# Patient Record
Sex: Male | Born: 1940 | Race: Black or African American | Hispanic: No | Marital: Single | Smoking: Never smoker
Health system: Southern US, Community
[De-identification: ages and names within clinical notes are randomized; demographics above are authoritative.]

## PROBLEM LIST (undated history)

## (undated) DIAGNOSIS — E119 Type 2 diabetes mellitus without complications: Secondary | ICD-10-CM

## (undated) DIAGNOSIS — H269 Unspecified cataract: Secondary | ICD-10-CM

## (undated) DIAGNOSIS — I10 Essential (primary) hypertension: Secondary | ICD-10-CM

## (undated) DIAGNOSIS — N289 Disorder of kidney and ureter, unspecified: Secondary | ICD-10-CM

## (undated) DIAGNOSIS — I639 Cerebral infarction, unspecified: Secondary | ICD-10-CM

## (undated) HISTORY — DX: Type 2 diabetes mellitus without complications: E11.9

## (undated) HISTORY — DX: Cerebral infarction, unspecified: I63.9

## (undated) HISTORY — DX: Unspecified cataract: H26.9

## (undated) HISTORY — DX: Essential (primary) hypertension: I10

## (undated) HISTORY — DX: Disorder of kidney and ureter, unspecified: N28.9

---

## 1998-01-06 ENCOUNTER — Emergency Department: Admit: 1998-01-06 | Payer: Self-pay | Admitting: Emergency Medicine

## 2000-06-09 ENCOUNTER — Emergency Department: Admit: 2000-06-09 | Payer: Self-pay | Source: Emergency Department | Admitting: Internal Medicine

## 2001-08-04 ENCOUNTER — Inpatient Hospital Stay
Admission: EM | Admit: 2001-08-04 | Disposition: A | Discharge status: Other Acute Inpatient Hospital | Payer: Self-pay | Source: Emergency Department | Admitting: Internal Medicine

## 2001-08-08 ENCOUNTER — Observation Stay
Admission: AD | Admit: 2001-08-08 | Disposition: A | Discharge status: Home / Self Care | Payer: Self-pay | Source: Ambulatory Visit | Admitting: Cardiovascular Disease

## 2001-11-26 ENCOUNTER — Inpatient Hospital Stay
Admission: EM | Admit: 2001-11-26 | Disposition: A | Discharge status: Home / Self Care | Payer: Self-pay | Source: Emergency Department | Admitting: Vascular Neurology

## 2002-09-20 ENCOUNTER — Inpatient Hospital Stay
Admission: AD | Admit: 2002-09-20 | Disposition: A | Discharge status: Home / Self Care | Payer: Self-pay | Source: Ambulatory Visit | Admitting: Hospice and Palliative Medicine

## 2012-01-03 NOTE — Discharge Summary (Unsigned)
[name], [name]      MEDICAL RECORD NUMBER:         [id number]            ATTENDING PHYSICIAN:           Marshall Cork, MD      DATE OF ADMISSION:             11/26/2001      DATE OF DISCHARGE:             11/30/2001            HOSPITAL COURSE: The patient was admitted with complaints of episodic      vertigo and dizziness. He was admitted for further evaluation. He had an      MRI and MRA of his brain that did not show any acute abnormalities, but did      show a small right pontine lacune. 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 The MRA intracranially      did not show any clear abnormalities.  He also had a transthoracic      echocardiogram that was essentially unremarkable. There was some mild LVH      with an EF of 65%.  His B12 level was 328. TSH was 0.534 both within the      normal range. His cholesterol was 106. Urinalysis was negative. RPR was      nonreactive. He had a chest x-ray that initially showed some mild perihilar      atelectasis with a possible small left pleural effusion. The patient was      asymptomatic.  A followup PA and lateral chest x-ray showed the left      basilar pleural and parenchymal changes that are probably fibrotic.      Otherwise, negative.            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 The patient has complete resolution of his      symptoms and remained asymptomatic while in the hospital. He was discharged      in stable condition. He was told to followup with his primary care      physician for the chest x-ray findings.  He will followup with me in      several weeks.                  ___________________________________        Date Signed: _______________      Marshall Cork, MD            EBS:amg:dlss      D:    [id number]      T:     12/01/2001      #:    [id number]      [id number]            cc:   Marshall Cork, MD

## 2012-01-03 NOTE — Discharge Summary (Unsigned)
[name], [name]      MEDICAL RECORD NUMBER:         [id number]            ATTENDING PHYSICIAN:           Wendie Chess, MD      DATE OF ADMISSION:             08/08/2001      DATE OF DISCHARGE:             08/09/2001                  DISCHARGE DIAGNOSES:      1.    Single vessel CAD, status post cardiac catheterization.      2.    Hypertension.      3.    Dyslipidemia.      4.    Suspect for diabetes mellitus.      5.    Complete heart block with beta blockers.            HISTORY OF PRESENT ILLNESS:   This 71 year old gentleman was transferred to      [facility] after admission to [facility] on 7/27 for a      chief complaint of dizziness, nausea, and vomiting.  The patient did [name]      have any chest pain, but did report some shortness of breath.  The patient      had been having dizziness for about 3 months and was being treated by his      primary care physician.  During initial work-up, the cardiovascular group      was consulted and the patient had a positive thallium stress test,      revealing ischemia of the anterior lateral wall.            HOSPITAL COURSE:  The patient was transferred to [facility] for      cardiac catheterization on 7/29, after the patient was ruled out for      myocardial infarction and a cardiac catheterization was performed by Dr.      Desmond Dike.  The findings of the cardiac catheterization are as      follows - 70-80% distal LAD occlusion.  Left circumflex normal.  RCA      normal.  No left or right shunt.  Moderate AI.  Ejection fraction of      approximately 52%.  In the post cath period, the patient did have a      significant elevation in his blood pressure, requiring some change in his      medications and some additional clonidine to maintain his blood pressure      below [id number] systolic.            Otherwise, the patient's stay was uncomplicated after cardiac      catheterization, but should make of note that  during his stay in [facility], the patient had a reaction of complete heart block to the      atenolol that he came into the hospital and was maintained on during his      brief stay at [place]:   Neurologically, the patient is awake, alert,  oriented, comfortable,  and without further chest pain or other complaints.      Cardiovascular - regular rate and rhythm, normal S1 and S2, with a 1/6      stable murmur at the left sternal border.  Respirations non-labored.  Lungs      clear to auscultation bilaterally.  Abdomen soft, non-tender, and      non-distended, with positive bowel sounds.  Extremities without edema.      Bilateral inguinal cath sites are without bleeding or hematoma.  Distal      peripheral pulses intact.  Vital signs -heart rate 59-70, respirations 18,      and blood pressure 140/78.  The patient's O2  sat is 100% on room air.            DISCHARGE MEDICATIONS:  Aspirin 325 mg daily, Norvasc 10 mg p.o. daily,      Altace 10 mg p.o. daily, hydrochlorothiazide 25 mg p.o. daily, and p.r.n.      nitroglycerine.            DISCHARGE INSTRUCTIONS:  The patient is to follow up with his PCP, Dr.      Lissa Merlin, in approximately one week for further control of his blood pressure      and investigation of possible diabetes mellitus, follow-up labs to monitor      serum creatinine, and dyslipidemia.  The patient is also going to follow up      with Dr. Era Bumpers in the [facility] office in approximately 1-2 weeks.                  ___________________________________        Date Signed: _______________      Wendie Chess, MD            Dictated by: Marton Redwood, NP            SP:amg:mp      D:    08/09/2001      T:    08/11/2001      #:    [id number]      [id number]            cc:   Marton Redwood, NP

## 2012-01-03 NOTE — Procedures (Unsigned)
[name], [name]      MEDICAL RECORD NUMBER:         [id number]            DATE OF PROCEDURE:             08/08/2001      SURGEON:                       Wendie Chess, MD            REFERRING PHYSICIAN:  Tye Maryland, M.D.            ANESTHESIA:            PREPROCEDURE DIAGNOSIS:            POSTPROCEDURE DIAGNOSIS:            PROCEDURE:      1. LEFT HEART CATHETERIZATION      2. RIGHT HEART CATHETERIZATION      3. SELECTIVE RIGHT AND LEFT CORONARY CINEANGIOGRAPHY      4. LEFT VENTRICULOGRAPHY      5. AORTOGRAPHY            CATHETERIZATION NUMBER:            HISTORY OF PRESENT ILLNESS:  The patient is a 71 year old male with      hypertension.  The patient presented to [facility] with dizziness      and nausea.  He was found to have intermittent complete heart block felt      secondary to beta blocker therapy for his hypertension.  Heart block      resolved with discontinuation of beta blockers.  Carotid Doppler studies      reportedly were unremarkable.  Thallium study was performed which      demonstrated anteroapical ischemia with inferior scar and cardiac      catheterization was recommended.  Echocardiogram demonstrated possible      aorta-right atrial communication and right heart catheterization was      requested.            DESCRIPTION OF PROCEDURE:  The patient was taken to the cardiac      catheterization laboratory after informed consent was obtained.  He was      premedicated with Fentanyl, Versed, and Benadryl for conscious sedation.      The right femoral area was prepared and draped in the usual sterile manner.      Xylocaine was infiltrated.  The right femoral artery was punctured and a      6-French sheath was placed percutaneously.  Using 6-French JL-4 and 3-D RC      catheters, selective left and right coronary artery cineangiography was      performed by hand injection of angiographic dye in varying projections.      Left heart catheterization and left  ventriculography were performed with a      6-French pigtail catheter with pullback pressure measurements across the      aortic valve.  The catheter and sheath were withdrawn and the puncture site      was closed with Vasoseal.  The left femoral artery was then punctured.  A      6-French sheath was placed percutaneously.  The left femoral vein was      punctured and a 7-French sheath was  placed percutaneously.  Right heart      catheterization was performed with a Swan-Ganz catheter with oxygen samples      for oximetry.  An aortogram was performed with a 6-French pigtail catheter      in the left anterior oblique projection.  Catheters and sheaths were      removed.  The puncture sites were closed with Vasoseal.  There were no      complications or allergic reactions.                                         CATHETERIZATION FINDINGS            I.    HEMODYNAMICS:                  Sinus rhythm - rate 60.            Right atrium - A 7, V 5, mean 4.            Right ventricle - 32/7.            Pulmonary artery - 32/17, mean 20.            Pulmonary capillary wedge - A 15, V 14, mean 12.            Aorta - 80/90, mean 123.            Left ventricle - 192/Z 25.            There was no gradient on pullback across the aortic valve.            Pullback pressures - left ventricular - 182/Z 27, aorta 181/88, mean            125.                        Oxygen saturations -                Right atrium - 52.7%.                Right ventricle - 62.6%.                Pulmonary artery - 62.5%.                Central aorta - 92.6%.                Superior vena cava - 67%.                Inferior vena cava - 71.5%.                  Cardiac output by estimated FIC was 4.3 liters per minute.            II.   CORONARY CINEANGIOGRAMS:            a.    Left Main Coronary Artery - The left main coronary artery is normal      without stenosis.            b.    Left Anterior Descending Coronary Artery (LAD) - The left anterior       descending coronary artery (LAD) has approximately 30% stenosis proximally      with mild luminal irregularity throughout.  The distal left anterior      descending  coronary artery (LAD) at the apex has an 80 to 90% stenosis.      This is just proximal to the apical recurrent branches.  The lateral apical      recurrent branch has an 80 to 90% stenosis.            c.    Ramus Intermedius - There is a large bifurcating ramus intermedius      branch which has mild luminal irregularity without stenosis.            d.    Circumflex Coronary Artery - The circumflex coronary artery gives off      a posterolateral branch which is normal without stenosis.            e.    Right Coronary Artery (RCA) - The right coronary artery (RCA) is      dominant giving off the posterior descending coronary artery and      posterolateral branches.  Mild luminal irregularity is present without      significant stenosis.            III.  LEFT VENTRICULOGRAM:                  Right anterior oblique left ventriculography demonstrates normal left            ventricular contractility without regional wall motion abnormalities.            No significant mitral valve insufficiency is identified.  Left            ventricular ejection fraction was calculated at 52%.            IV.   AORTOGRAM:            Aortography in the left anterior oblique projection demonstrates moderate      aortic valve insufficiency.  The aortic valve is trileaflet.            SUMMARY:            1. Moderate aortic valve insufficiency.      2. Normal left ventricular systolic function.      3. Coronary artery disease with severe stenosis of the very distal left         anterior descending coronary artery (LAD) with mild luminal irregularity         of the proximal left anterior descending coronary artery (LAD).      4. No evidence of intracardiac shunt.            DISCUSSION AND RECOMMENDATIONS:  The patient's anti-hypertensive      medications will be adjusted.  Beta  blockers will be avoided because of      transient complete heart block.  Once his blood pressure is better      regulated he should be stable for discharge.                              ___________________________________                Date Signed:      _______________      Wendie Chess, MD            LAM:amw:MH      D:    08/08/2001      T:    08/09/2001      #:    [id number]      N:  [id number]            cc:   Wendie Chess, MD            Rivka Spring, MD            Tye Maryland, M.D.      [address], [id number]      [place], [place]  [id number]            Jodell Cipro, M.D.      [address]      [place],   [address]*

## 2012-01-03 NOTE — Procedures (Unsigned)
PATIENTIAN, FRANZONI      MEDICAL RECORD NUMBER:         [id number]            DATE OF PROCEDURE:             11/29/2001      PHYSICIAN:                     Fredda Hammed, MD      REFERRING PHYSICIAN:     Marshall Cork, MD            CLINICAL INDICATIONS/DIAGNOSES:  Cerebrovascular accident and syncope.                                            M-MODE/2-D            35   mm      LA                                        (19-33mm)      30   mm      Ao Root                                         (19-40mm)      22   mm      Ao Leaflet Separation                     (15-60mm)      18   mm      RVd                                       (7-56mm)      29   mm      LVs                                       (20-26mm)      45   mm      LVd                                       (37-57mm)      14   mm      IVSd                                      (6-16mm)      13   mm      LVPWd                                     (6-31mm)  36%          Fractional Shortening                     (25-45%)      66%          Est. EF                                   (>55%)            (No pericardial effusion and no pleural effusion.)            PEAK VELOCITY          PEAK              DOPPLER      DEGREE OF VALVULAR      VALVE                          GRADIENT         MEAN             REGURGITATION      AREA                                           GRADIENT      Mitral 0.8  m/s(0.6-1.3)      Aortic 1.0  m/s(1.0-1.7)      Pulm.  0.9  m/s(0.6-0.9)      Tric.   0.4  m/s(0.3-0.7)      LVOT   0.9  m/s(0.7-1.7)            DESCRIPTION OF FINDINGS/IMPRESSION OF M-MODE AND TWO-DIMENSIONAL STUDY:      1. Normal intracardiac chamber dimensions.      2. Mild to moderate concentric left ventricular hypertrophy with normal         left ventricular systolic function.  Estimated left ventricular ejection         fraction is 65%.      3. No structural valvular abnormalities.      4. No pericardial effusion.      5. No thrombi,  vegetations or other abnormal intracardiac masses.            ___________________________________      Fredda Hammed, MD            SPR:amw:MH      D:     11/29/2001      T:     11/30/2001      #:     [id number]      [id number]            cc:    Fredda Hammed, MD             Marshall Cork, MD

## 2012-01-03 NOTE — Procedures (Unsigned)
PATIENT NAME:  [name], [name]         MED REC #:  [id number]            ORD. DR.:                                DATE OF EXAM:  08/05/2001            ALT. DR.:                                NS/BED:  2C  40 01            D.O.B.:  03-28-1940     AGE:  71        ACCT.#:  [id number]                                                             ECHOCARDIOGRAM REPORT                  TAPE:  [id number]        # [id number]                                                    ECHOCARDIOGRAM                                              M-MODE COMMENTS                 LA DIMENSION             3.6  cm.  (1.5-4.0)           AORTIC ROOT              3.5  cm.  (2.0-3.7)           LVD DIMENSION            5.4  cm.  (3.7-5.6)           LVS DIMENSION            3.6  cm.  (2.0-4.0)           LV PW THICKNESS          1.1  cm.  (0.6-1.1)           IVS THICKNESS            0.9  cm.  (0.6-1.1)           FRACTIONAL SHORTENING    32   %    (24-64%)           LVEF                     60   %           MITRAL VALVE EXCURSION        mm  SLOPE AML                     mm/s (>30mm/sec)           RVd DIMENSION            1.4  cm.  (0.7-2.3)                                                    2-D            MITRAL VALVE:      AORTIC VALVE:      LEFT VENTRICULAR WALL MOTION:      LEFT VENTRICULAR CAVITY:      RIGHT HEART:                                                        DOPPLER                       VELOCITIES                        REGURGITATION            AORTIC VALVE         -     m/s (0.9-1.8)           MILD      MITRAL VALVE         -     m/s (0.6-1.4)           MILD      TRICUSPID VALVE      -     m/s (0.4-0.8)           MILD      PULMONIC VALVE       -     m/s (0.5-0.9)           MILD                  INDICATION:  COMPLETE HEART BLOCK; HYPERTENSION; SYNCOPE.            EXAMINATION:  ECHOCARDIOGRAM/2D/M MODE COMP.            DESCRIPTION OF FINDINGS:  M-MODE AND 2-D FINDINGS:  There is normal left      ventricular cavity size, wall  thickness and systolic function with no      segmental regional wall motion abnormalities.  The mitral valve leaflets      are minimally thickened with intact leaflet excursion.  There appears to be      a trileaflet aortic valve which is slightly thickened and/or calcified with      intact leaflet excursion.  The pulmonic and tricuspid valve leaflets appear      structurally intact with intact leaflet excursion.  The left atrial size is      normal.  The left ventricular cavity size is normal with intact systolic      function.  There is no pericardial effusion.            IMPRESSION      1.   NORMAL LEFT VENTRICULAR CAVITY SIZE, WALL  THICKNESS AND SYSTOLIC      FUNCTION WITH NO SEGMENTAL REGIONAL WALL MOTION ABNORMALITIES.      2.   MINIMALLY THICKENED MITRAL VALVE LEAFLETS WITH VERY MILD DEGREE OF      MITRAL REGURGITATION.      3.   MILD AORTIC VALVE SCLEROSIS WITH MILD AORTIC INSUFFICIENCY.      4.   MILD TRICUSPID REGURGITATION WITH ESTIMATED RIGHT VENTRICULAR SYSTOLIC      PRESSURE OF APPROXIMATELY 37 MMHG.      5.   DOPPLER EVALUATION NOTES A SMALL JET DURING DIASTOLE NEAR THE AORTIC      VALVE, WITH POSSIBLE COMMUNICATION WITH THE RIGHT ATRIUM AND A SMALL SINUS      OF VALSALVA ANEURYSM WITH AORTIC TO RIGHT ATRIAL COMMUNICATION CANNOT BE      FULLY EXCLUDED.                        Dictating Physician:  [place]  [id number]                  ______________________________________________  __________      _______________      Signature of Reviewing Physician                      Physician # Date of      Signature            [id number]      D: 08/07/2001  5:07 P      T: 08/17/2001  3:18 P      J: [id number]      N: [id number]      cc:   Rivka Spring, MD                        Fax/Mailed:

## 2012-01-03 NOTE — Discharge Summary (Unsigned)
The patient was transferred to [facility] for cardiac cath.            ADMISSION DIAGNOSIS:                 Nausea and vomiting.            DISCHARGE DIAGNOSES:      1.   Intermediate coronary syndrome.      2.   Complete heart block.      3.   Hypertension.      4.   Carotid insufficiency.            HOSPITAL COURSE:                     This 71 year old man was admitted to      the hospital after he was working outside and began to experience nausea      and vomiting.  The patient was admitted secondary to significant EKG change      compared to his previous EKG.  EKG performed at the time of admission      showed that the patient had inferior and anterior lateral ischemic change.      The patient was admitted to the telemetry unit at which time during the      admission the patient had a 5.1 second complete third degree heart block.      Cardiology consult was obtained.  An echo as well as adenosine thallium was      performed.  The thallium showed a fixed defect in the inferior wall and      reversible defect anterior septal.  The patient then was transferred to      [facility] for cardiac cath and possible intervention including      angioplasty.                        TDL:2spps      Dictated:    08/08/2001  8:15 P        _____________________________      ___________      Transcribed: 08/10/2001  6:54 A        Juluis Rainier, MD                 Date      Signed      Document Number: [id number]                  CC:  Juluis Rainier, MD

## 2012-01-04 NOTE — Discharge Summary (Unsigned)
ATTENDING MD:  Laverda Sorenson, MD      ADMITTED:      09/20/2002      DISCHARGED:    10/15/2002            DISCHARGE DIAGNOSES      1.   Left middle cerebral artery infarction.      2.   Hypertension.      3.   Hyperlipidemia.      4.   History of lacunar cerebrovascular accident.            CONSULTATIONS:  Dr. Simmie Davies of internal medicine.            MAJOR COMPLICATIONS:  None.            PROCEDURES:  None.            HISTORY OF PRESENT ILLNESS:  The patient is a 71 year old gentleman who was      admitted to [facility] with left middle cerebral artery      infarction on September 14, 2002.  He has a history of myocardial infarction      in 1993.  He also has hypertension and hyperlipidemia.  He was started on      Plavix and aspirin.            Physical exam on admission revealed a well-developed male in no acute      distress.  Pupils were equal and reactive to light.  Ears, nose, throat,      and mouth were clear. Neck was supple.  Skin was intact.  Lungs were clear.      Heart had regular rhythm.  Abdomen was soft and nontender.  No masses or      hepatomegaly was present.  Bowel sounds were normal.  Extremities are      without edema.  Neurologically, the patient was awake with global aphasia.      Right hemiparesis with trace motor activity in the right lower extremity      was present.            HOSPITAL COURSE:  The patient remained medically stable and actively      participated in the rehabilitation program.  He made significant gains in      all areas of therapy, the details of which are recorded in the chart.  He      was discharged to home on aspirin 325 mg p.o. daily; atorvastatin 20 mg      p.o.      nightly; clopidogrel 75 mg p.o. daily; metformin; hydrochloride 500 mg p.o.      q.p.m.; atenolol 25 mg p.o. daily; multivitamin with mineral 1 p.o. daily;      Ramipril 5 mg p.o. daily.  Followup with primary care physician within a      week after discharge.  Home physical,  occupational, and speech therapy, as      well as home health aide will be provided.                              ___________________________________     Date Signed: _______________      Laverda Sorenson, MD                  D 10/15/2002  1:02 P; T 10/17/2002  9:16 A; [id number] - - , [id number], [id number]      CC:  Laverda Sorenson, MD

## 2012-05-11 ENCOUNTER — Ambulatory Visit: Payer: Medicare Other | Attending: Family Medicine | Admitting: Physical Therapy

## 2012-05-11 DIAGNOSIS — M629 Disorder of muscle, unspecified: Secondary | ICD-10-CM | POA: Insufficient documentation

## 2012-05-11 DIAGNOSIS — R4701 Aphasia: Secondary | ICD-10-CM | POA: Insufficient documentation

## 2012-05-11 DIAGNOSIS — R279 Unspecified lack of coordination: Secondary | ICD-10-CM | POA: Insufficient documentation

## 2012-05-11 DIAGNOSIS — M2420 Disorder of ligament, unspecified site: Secondary | ICD-10-CM | POA: Insufficient documentation

## 2012-05-11 DIAGNOSIS — M6281 Muscle weakness (generalized): Secondary | ICD-10-CM | POA: Insufficient documentation

## 2012-05-11 DIAGNOSIS — Z5189 Encounter for other specified aftercare: Secondary | ICD-10-CM | POA: Insufficient documentation

## 2012-05-12 ENCOUNTER — Ambulatory Visit: Payer: Medicare Other

## 2012-05-17 ENCOUNTER — Ambulatory Visit: Payer: Medicare Other | Admitting: Speech Pathology

## 2012-05-17 ENCOUNTER — Ambulatory Visit: Payer: Medicare Other | Admitting: Physical Therapy

## 2012-05-19 ENCOUNTER — Ambulatory Visit: Payer: Medicare Other | Admitting: Physical Therapy

## 2012-05-19 ENCOUNTER — Ambulatory Visit: Payer: Medicare Other | Admitting: Speech Pathology

## 2012-05-23 ENCOUNTER — Ambulatory Visit: Payer: Medicare Other | Admitting: Speech Pathology

## 2012-05-23 ENCOUNTER — Ambulatory Visit: Payer: Medicare Other | Admitting: Physical Therapy

## 2012-05-30 ENCOUNTER — Ambulatory Visit: Payer: Medicare Other

## 2012-06-01 ENCOUNTER — Encounter: Payer: Medicare Other | Admitting: Occupational Therapy

## 2012-06-02 ENCOUNTER — Ambulatory Visit: Payer: Medicare Other | Admitting: Physical Therapy

## 2012-06-02 ENCOUNTER — Encounter: Payer: Medicare Other | Admitting: *Deleted

## 2012-06-06 ENCOUNTER — Ambulatory Visit: Payer: Medicare Other | Admitting: Occupational Therapy

## 2012-06-06 ENCOUNTER — Ambulatory Visit: Payer: Medicare Other | Admitting: Physical Therapy

## 2012-06-08 ENCOUNTER — Ambulatory Visit: Payer: Medicare Other | Admitting: Physical Therapy

## 2012-06-08 ENCOUNTER — Ambulatory Visit: Payer: Medicare Other | Admitting: *Deleted

## 2012-06-09 ENCOUNTER — Ambulatory Visit: Payer: Medicare Other | Admitting: *Deleted

## 2012-06-12 ENCOUNTER — Ambulatory Visit: Payer: Medicare Other | Admitting: Physical Therapy

## 2012-06-13 ENCOUNTER — Ambulatory Visit: Payer: Medicare Other | Admitting: Physical Therapy

## 2012-06-13 ENCOUNTER — Ambulatory Visit: Payer: Medicare Other | Attending: Family Medicine | Admitting: Occupational Therapy

## 2012-06-13 DIAGNOSIS — R279 Unspecified lack of coordination: Secondary | ICD-10-CM | POA: Insufficient documentation

## 2012-06-13 DIAGNOSIS — Z5189 Encounter for other specified aftercare: Secondary | ICD-10-CM | POA: Insufficient documentation

## 2012-06-13 DIAGNOSIS — M629 Disorder of muscle, unspecified: Secondary | ICD-10-CM | POA: Insufficient documentation

## 2012-06-13 DIAGNOSIS — M6281 Muscle weakness (generalized): Secondary | ICD-10-CM | POA: Insufficient documentation

## 2012-06-13 DIAGNOSIS — R4701 Aphasia: Secondary | ICD-10-CM | POA: Insufficient documentation

## 2012-06-13 DIAGNOSIS — M2420 Disorder of ligament, unspecified site: Secondary | ICD-10-CM | POA: Insufficient documentation

## 2012-06-15 ENCOUNTER — Ambulatory Visit: Payer: Medicare Other | Admitting: Physical Therapy

## 2012-06-15 ENCOUNTER — Ambulatory Visit: Payer: Medicare Other | Admitting: Occupational Therapy

## 2012-06-20 ENCOUNTER — Ambulatory Visit: Payer: Medicare Other | Admitting: Occupational Therapy

## 2012-06-23 ENCOUNTER — Ambulatory Visit: Payer: Medicare Other | Admitting: Occupational Therapy

## 2012-06-27 ENCOUNTER — Ambulatory Visit: Payer: Medicare Other | Admitting: Occupational Therapy

## 2012-06-29 ENCOUNTER — Encounter: Payer: Medicare Other | Admitting: Occupational Therapy

## 2012-07-11 ENCOUNTER — Ambulatory Visit: Payer: Medicare Other | Admitting: Occupational Therapy

## 2012-07-18 ENCOUNTER — Ambulatory Visit: Payer: Medicare Other | Admitting: Occupational Therapy

## 2012-07-24 ENCOUNTER — Ambulatory Visit: Payer: Medicare Other | Attending: Family Medicine | Admitting: Occupational Therapy

## 2012-07-24 DIAGNOSIS — R4701 Aphasia: Secondary | ICD-10-CM | POA: Insufficient documentation

## 2012-07-24 DIAGNOSIS — M629 Disorder of muscle, unspecified: Secondary | ICD-10-CM | POA: Insufficient documentation

## 2012-07-24 DIAGNOSIS — M6281 Muscle weakness (generalized): Secondary | ICD-10-CM | POA: Insufficient documentation

## 2012-07-24 DIAGNOSIS — R279 Unspecified lack of coordination: Secondary | ICD-10-CM | POA: Insufficient documentation

## 2012-07-24 DIAGNOSIS — M2420 Disorder of ligament, unspecified site: Secondary | ICD-10-CM | POA: Insufficient documentation

## 2012-07-24 DIAGNOSIS — Z5189 Encounter for other specified aftercare: Secondary | ICD-10-CM | POA: Insufficient documentation

## 2012-07-25 ENCOUNTER — Ambulatory Visit: Payer: Medicare Other | Admitting: Occupational Therapy

## 2013-06-08 ENCOUNTER — Other Ambulatory Visit (HOSPITAL_COMMUNITY): Payer: Self-pay | Admitting: Specialist

## 2013-06-08 DIAGNOSIS — N189 Chronic kidney disease, unspecified: Secondary | ICD-10-CM

## 2013-06-15 ENCOUNTER — Ambulatory Visit (HOSPITAL_COMMUNITY)
Admission: RE | Admit: 2013-06-15 | Discharge: 2013-06-15 | Disposition: A | Discharge status: Home / Self Care | Payer: Medicare Other | Source: Ambulatory Visit | Attending: Specialist | Admitting: Specialist

## 2013-06-15 DIAGNOSIS — N189 Chronic kidney disease, unspecified: Secondary | ICD-10-CM

## 2015-04-06 IMAGING — US US RENAL
1 series · 14 of 25 positions shown · non-contrast
Comparison: None.

CLINICAL DATA: Chronic kidney disease.

EXAM:
RENAL/URINARY TRACT ULTRASOUND COMPLETE

[Series 1: us renal · 0.22mm/px · 14 of 34 slices shown]
[im 1/34]
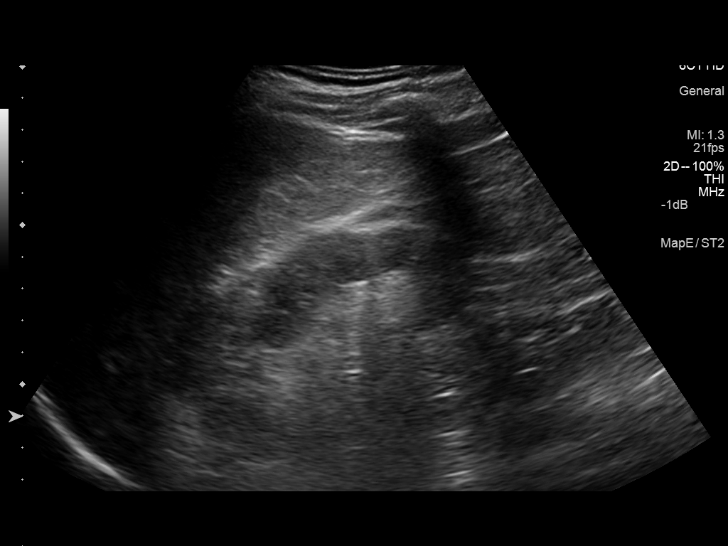
[im 3/34]
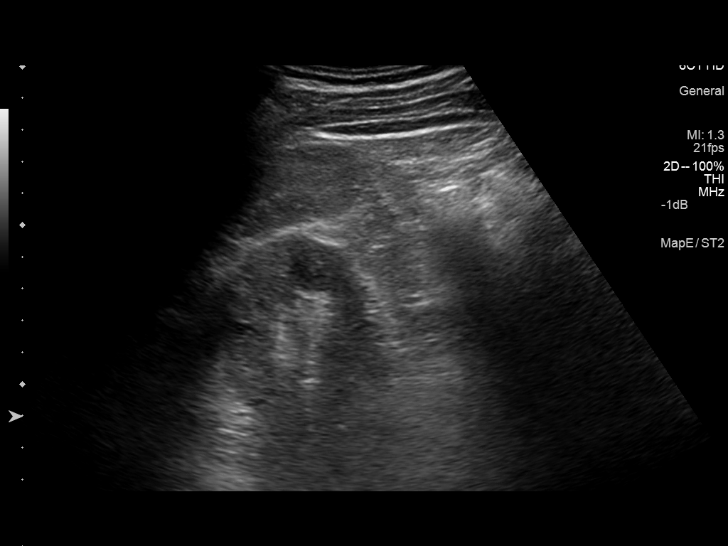
[im 6/34]
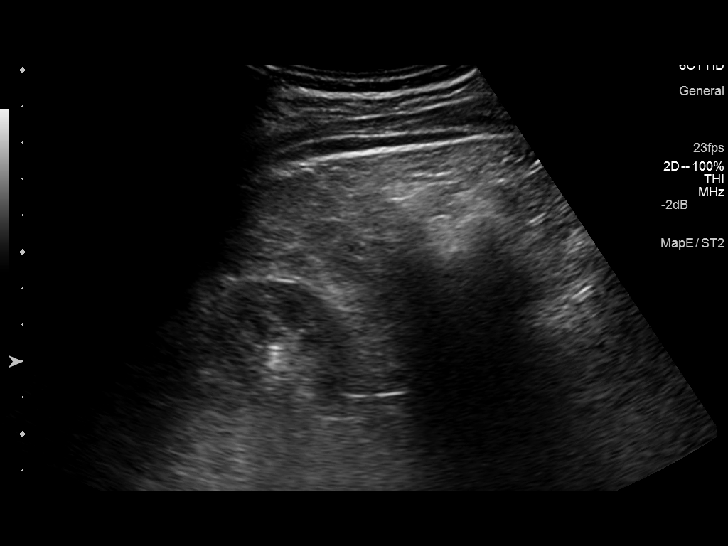
[im 9/34]
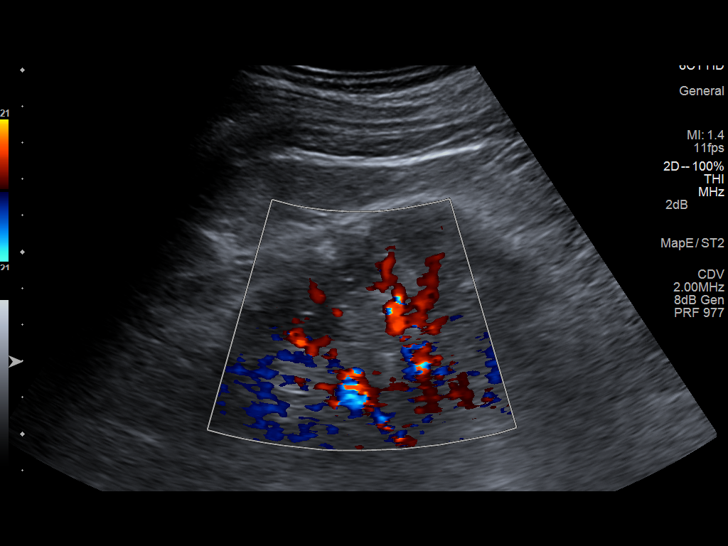
[im 12/34]
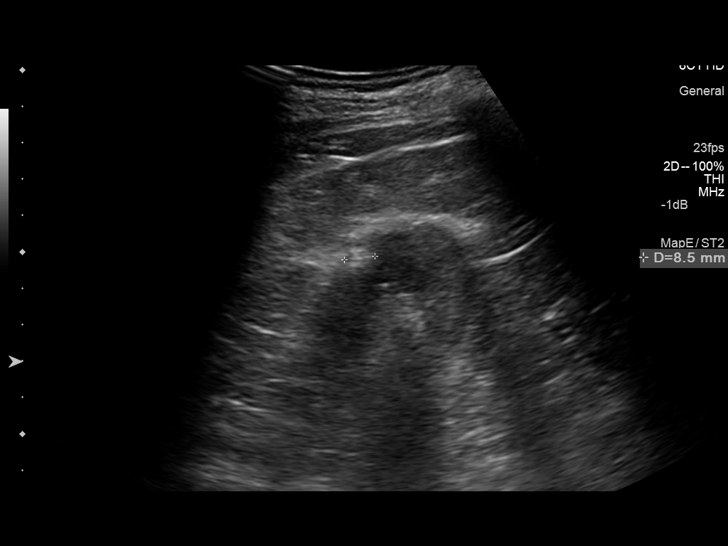
[im 13/34]
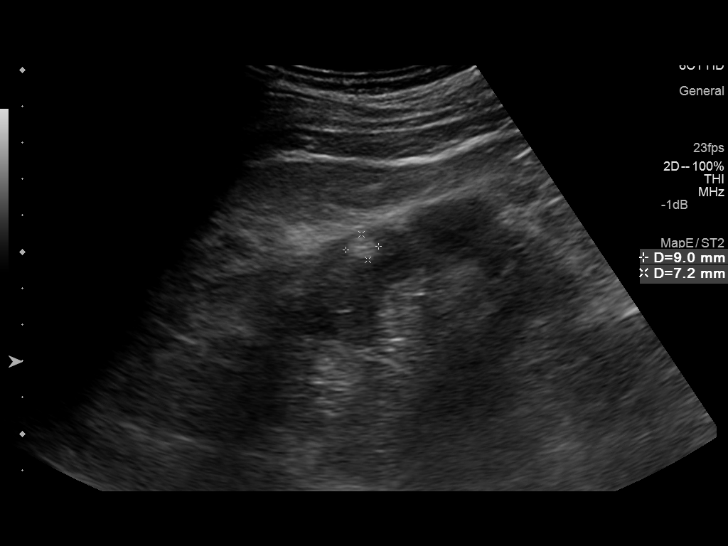
[im 16/34]
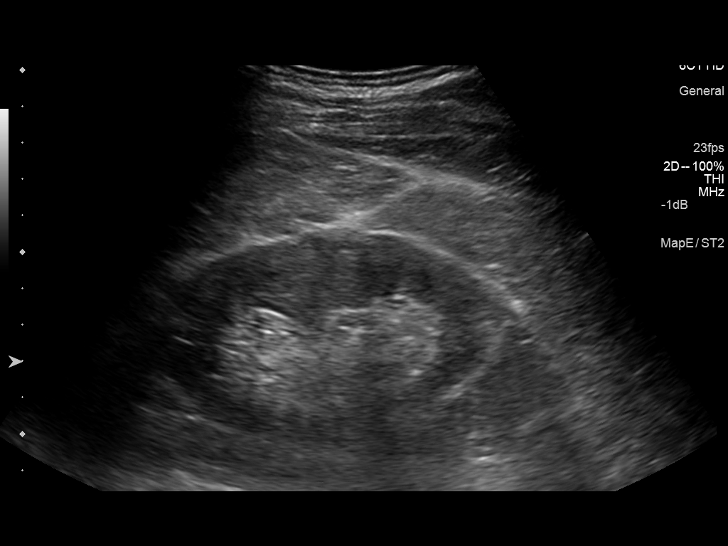
[im 18/34]
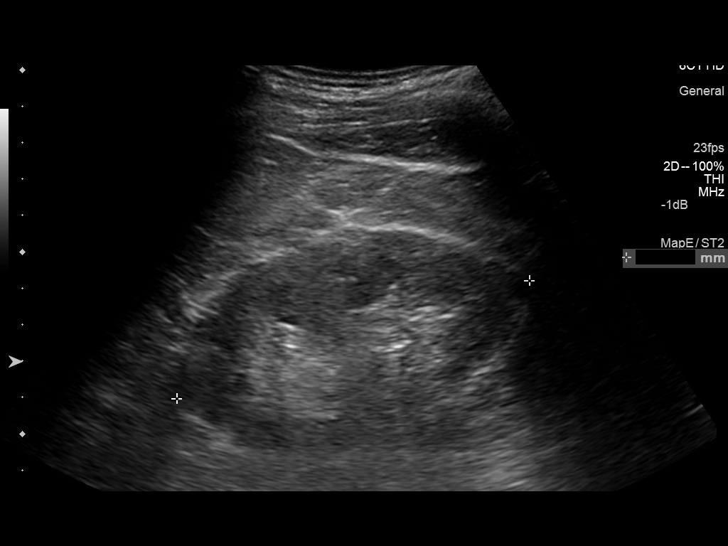
[im 21/34]
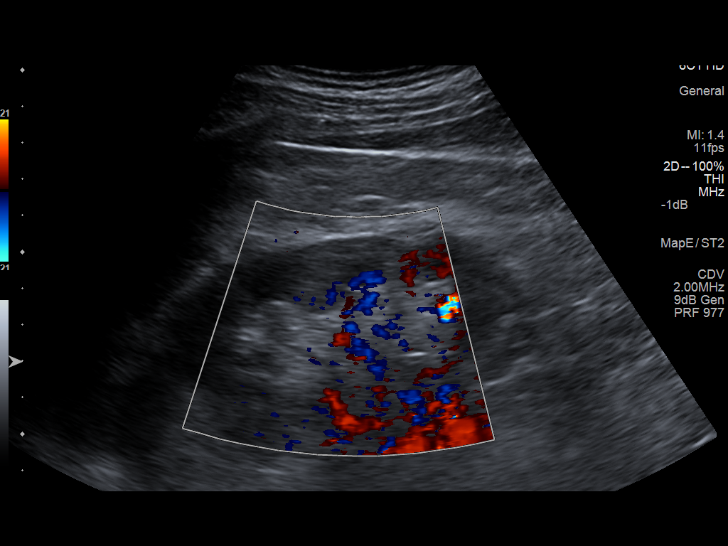
[im 23/34]
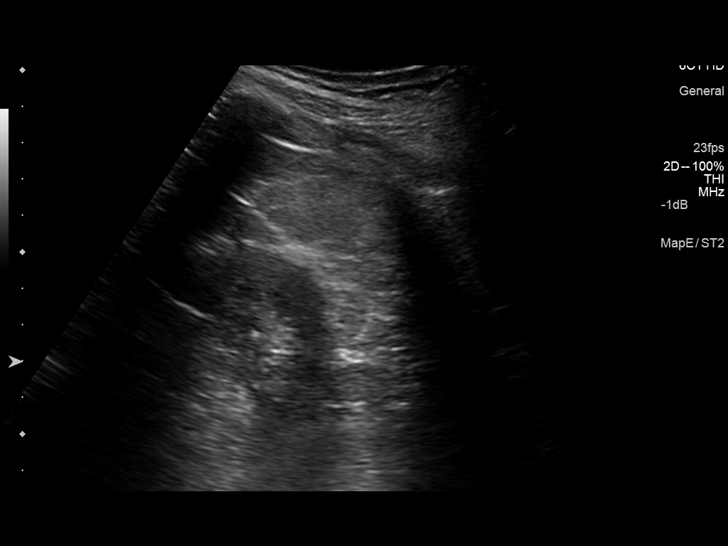
[im 25/34]
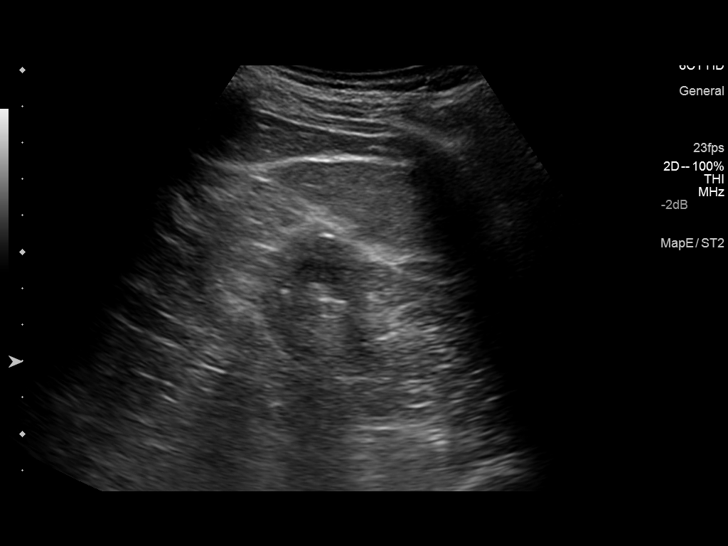
[im 28/34]
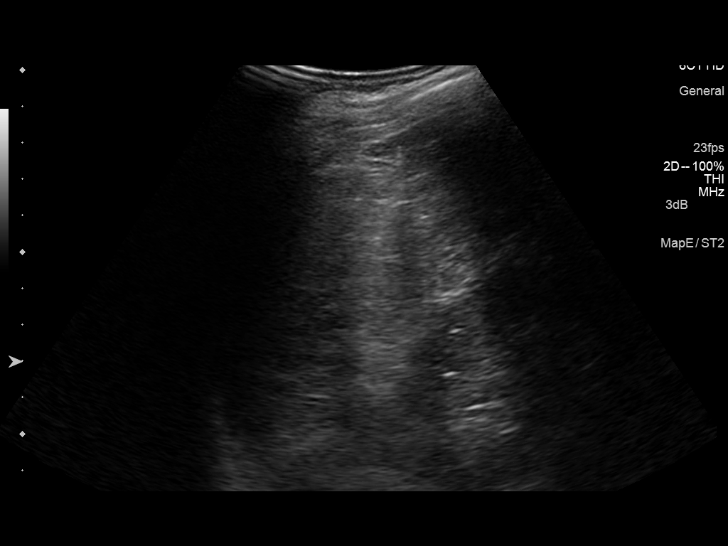
[im 31/34]
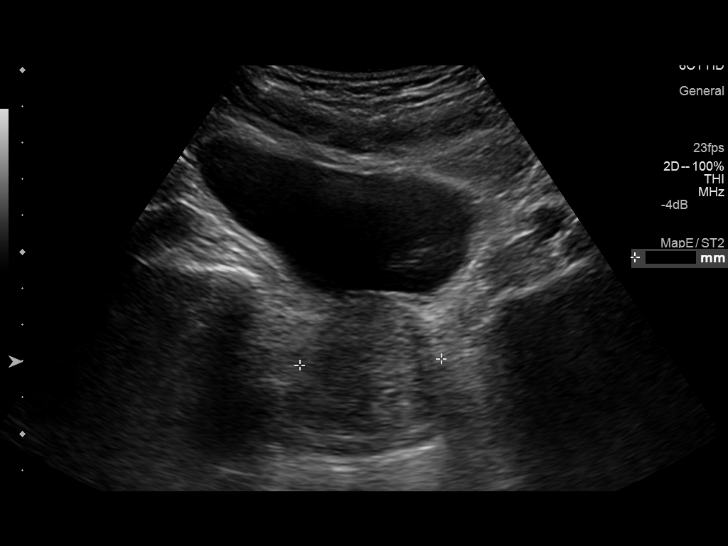
[im 34/34]
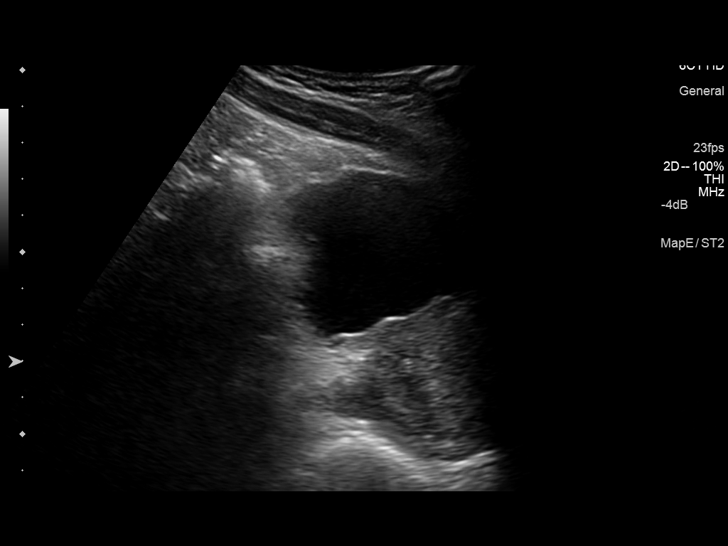

[14 of 25 positions shown; findings below may reference images not displayed]

FINDINGS: Right Kidney:

Length: 10.0 cm. Mildly increased echotexture. No hydronephrosis or
focal abnormality. 9 mm echogenic area laterally within the midpole.
I favor this represents fat within a renal cleft. This conceivably
could be a small angiomyolipoma.

Left Kidney:

Length: 10.2 cm. Increased echotexture. No hydronephrosis or focal
abnormality.

Bladder:

Appears normal for degree of bladder distention. Mildly prominent
prostate.
IMPRESSION: Increased echotexture within the kidneys bilaterally compatible with
chronic medical renal disease. No hydronephrosis.

## 2016-10-25 ENCOUNTER — Encounter: Payer: Self-pay | Admitting: Family Medicine

## 2017-07-26 ENCOUNTER — Encounter: Payer: Self-pay | Admitting: Family Medicine

## 2017-07-26 ENCOUNTER — Ambulatory Visit (INDEPENDENT_AMBULATORY_CARE_PROVIDER_SITE_OTHER): Payer: Medicare Other | Admitting: Family Medicine

## 2017-07-26 ENCOUNTER — Other Ambulatory Visit: Payer: Self-pay

## 2017-07-26 VITALS — BP 118/60 | HR 89 | Temp 98.6°F | Ht 70.0 in | Wt 148.0 lb

## 2017-07-26 DIAGNOSIS — E1122 Type 2 diabetes mellitus with diabetic chronic kidney disease: Secondary | ICD-10-CM

## 2017-07-26 DIAGNOSIS — R195 Other fecal abnormalities: Secondary | ICD-10-CM

## 2017-07-26 DIAGNOSIS — N189 Chronic kidney disease, unspecified: Secondary | ICD-10-CM

## 2017-07-26 DIAGNOSIS — I129 Hypertensive chronic kidney disease with stage 1 through stage 4 chronic kidney disease, or unspecified chronic kidney disease: Secondary | ICD-10-CM

## 2017-07-26 DIAGNOSIS — E785 Hyperlipidemia, unspecified: Secondary | ICD-10-CM

## 2017-07-26 DIAGNOSIS — I69328 Other speech and language deficits following cerebral infarction: Secondary | ICD-10-CM | POA: Diagnosis not present

## 2017-07-26 DIAGNOSIS — I6930 Unspecified sequelae of cerebral infarction: Secondary | ICD-10-CM

## 2017-07-26 DIAGNOSIS — I10 Essential (primary) hypertension: Secondary | ICD-10-CM

## 2017-07-26 NOTE — Patient Instructions (Addendum)
Test was positive for blood in stool at last primary care provider's office. If colonoscopy was recent, and normal, then there may no additional testing needed at this time. I would like to discuss this further once I have more information about the most recent colonoscopy. Please follow up in next few weeks to discuss this further.   I will check diabetes test, liver tests, cholesterol and electrolytes today. Please follow up to discuss these results in next few weeks as well.   Thanks for coming in today and it was nice meeting you.    IF you received an x-ray today, you will receive an invoice from [facility]. Please contact [facility] at [phone] with questions or concerns regarding your invoice.   IF you received labwork today, you will receive an invoice from [facility]. Please contact [facility] at [phone] with questions or concerns regarding your invoice.   Our billing staff will not be able to assist you with questions regarding bills from these companies.  You will be contacted with the lab results as soon as they are available. The fastest way to get your results is to activate your My Chart account. Instructions are located on the last page of this paperwork. If you have not heard from us regarding the results in 2 weeks, please contact this office.

## 2017-07-26 NOTE — Progress Notes (Signed)
Subjective:  By signing my name below, I, Stephen Luna, attest that this documentation has been prepared under the direction and in the presence of Stephen Flood, MD Electronically Signed: Greer Luna Medical Scribe7/16/2019 at 4:49 PM.   Patient ID: Stephen Luna, male    DOB: [birth year], 77 y.o.   MRN: [id number] Chief Complaint  Patient presents with  . Establish Care    talk about overall health. Transfer of care form his old provider     HPI  Approximately 15 minutes of chart review.  Stephen Luna is a 77 y.o. male who presents to Primary Care at [facility] here to establish care. There was a recent office visit by Stephen Luna on [facility] on 05/30/17. Has a history of HTN, HLD, DM.  Patients nephew reports that [facility], found positive blood in the stool for colonoscopy with Stephen Luna. Patient had a stroke 10-12 years ago. Patients cannot talk due to stroke. Patients opthalmologist is Stephen Luna.  HTN There was some concerning about his eye drops cause bradycardia. HR was 82 at his May 2nd, visit.  Colon Cancer Screening His nephew had declined colonoscopy on his behalf but did have a positive fecal occult blood test and was referred to gastrology.   HLD Takes simvastatin, 40 mg QD. Last lipids was noted in 2017 with LD 71, normal LFTS in June 2018.  Chronic Kidney Diease Followed by nephrology. Last renal function noted in June 2015, with a creatinine of  2.78.   DM Most recent A1c was 5.8 in April 2018, he is currently taking Amaryl. Complicated by chronic kidney diease, history of CDC, and CVA. He is on 1 mg of clopidogrel.   CVA Presents today with nephew to help with history. He takes Plavix and Statin.   There are no active problems to display for this patient.  History reviewed. No pertinent past medical history.   History reviewed. No pertinent surgical history.   No Known Allergies Prior to Admission  medications   Medication Sig Start Date End Date Taking? Authorizing Provider  ALPHAGAN P 0.1 % SOLN INSTILL 1 DROP INTO RIGHT EYE TWICE A DAY 06/07/17  Yes [provider]  amLODipine (NORVASC) 5 MG tablet Take 5 mg by mouth daily. 05/28/17  Yes [provider]  bimatoprost (LUMIGAN) 0.01 % SOLN Frequency:   Dosage:0.0     Instructions:  Note: 06/28/12  Yes [provider]  brimonidine-timolol (COMBIGAN) 0.2-0.5 % ophthalmic solution Frequency:   Dosage:0.0     Instructions:  Note: 06/28/12  Yes [provider]  brinzolamide (AZOPT) 1 % ophthalmic suspension INSTILL 1 DROP INTO RIGHT EYE TWICE A DAY 06/28/12  Yes [provider]  Calcium Carb-Cholecalciferol 600-800 MG-UNIT TABS Frequency:   Dosage:0.0     Instructions:  Note: 06/28/12  Yes [provider]  clopidogrel (PLAVIX) 75 MG tablet TAKE 1 TABLET BY MOUTH EVERY DAY 04/13/16  Yes [provider]  cycloSPORINE (RESTASIS) 0.05 % ophthalmic emulsion Frequency:   Dosage:0.0     Instructions:  Note: 06/28/12  Yes [provider]  finasteride (PROSCAR) 5 MG tablet Take 5 mg by mouth daily. 07/04/17  Yes [provider]  glimepiride (AMARYL) 1 MG tablet TAKE 1 TABLET (1 MG TOTAL) BY MOUTH EVERY MORNING BEFORE BREAKFAST. 07/08/16  Yes [provider]  lisinopril-hydrochlorothiazide (PRINZIDE,ZESTORETIC) 20-12.5 MG tablet TAKE 1 TABLET BY MOUTH EVERY DAY 07/12/16  Yes [provider]  Multiple Vitamins-Minerals (  ICAPS) CAPS Frequency:   Dosage:0.0     Instructions:  Note: 06/28/12  Yes [provider]  nitroGLYCERIN (NITROSTAT) 0.4 MG SL tablet 0.4 mg.   Yes [provider]  Omega-3 1000 MG CAPS Take by mouth. 10/02/15  Yes [provider]  prednisoLONE acetate (PRED FORTE) 1 % ophthalmic suspension  07/30/15  Yes [provider]  simvastatin (ZOCOR) 40 MG tablet TAKE 1 TABLET EVERY NIGHT 04/13/16  Yes [provider]  sodium  bicarbonate 650 MG tablet Take 650 mg by mouth 2 (two) times daily. 05/28/17  Yes [provider]   Social History   Socioeconomic History  . Marital status: Single    Spouse name: Not on file  . Number of children: Not on file  . Years of education: Not on file  . Highest education level: Not on file  Occupational History  . Not on file  Social Needs  . Financial resource strain: Not on file  . Food insecurity:    Worry: Not on file    Inability: Not on file  . Transportation needs:    Medical: Not on file    Non-medical: Not on file  Tobacco Use  . Smoking status: Never Smoker  . Smokeless tobacco: Never Used  Substance and Sexual Activity  . Alcohol use: Never    Frequency: Never  . Drug use: Never  . Sexual activity: Not Currently  Lifestyle  . Physical activity:    Days per week: Not on file    Minutes per session: Not on file  . Stress: Not on file  Relationships  . Social connections:    Talks on phone: Not on file    Gets together: Not on file    Attends religious service: Not on file    Active member of club or organization: Not on file    Attends meetings of clubs or organizations: Not on file    Relationship status: Not on file  . Intimate partner violence:    Fear of current or ex partner: Not on file    Emotionally abused: Not on file    Physically abused: Not on file    Forced sexual activity: Not on file  Other Topics Concern  . Not on file  Social History Narrative  . Not on file    Review of Systems     Objective:   Physical Exam  Constitutional: He is oriented to person, place, and time. He appears well-developed and well-nourished.  HENT:  Head: Normocephalic and atraumatic.  Eyes: Pupils are equal, round, and reactive to light. EOM are normal.  Neck: No JVD present. Carotid bruit is not present.  Cardiovascular: Normal rate, regular rhythm and normal heart sounds.  No murmur heard. Pulmonary/Chest: Effort normal and breath  sounds normal. He has no rales.  Musculoskeletal: He exhibits no edema.  Neurological: He is alert and oriented to person, place, and time.  Skin: Skin is warm and dry.  Psychiatric: He has a normal mood and affect.  Vitals reviewed.   Vitals:   07/26/17 1610 07/26/17 1626  BP: (!) 156/76 118/60  Pulse: 89   Temp: 98.6 F (37 C)   TempSrc: Oral   SpO2: 95%   Weight: 148 lb (67.1 kg)   Height: 5\' 10"  (1.778 m)          Assessment & Plan:  Stephen Luna is a 77 y.o. male History of CVA with residual deficit  -Currently on Plavix and statin.  Tolerating both.  No med changes.  Denies any new difficulties/deficits.  Type 2 diabetes mellitus with chronic kidney disease, without long-term current use of insulin, unspecified CKD stage (HCC) - Plan: Comprehensive metabolic panel, Hemoglobin A1c  -Currently on low-dose glimepiride.  Check A1c to determine if med changes or can hold off on medication.  With his CVA history, would want to maintain sufficient control, but would be cautious with hypoglycemia.  Essential hypertension - Plan: Comprehensive metabolic panel Stable. -Check labs.  No med changes at this time  Heme positive stool  -Reported heme positive stool based on home testing.  Would like to verify when his last colonoscopy was to determine if any further testing needed or discussion with gastroenterology to decide if testing needed  Hyperlipidemia, unspecified hyperlipidemia type - Plan: Lipid panel, Comprehensive metabolic panel  Tolerating statin, no med changes.  Check labs-  -follow-up next few weeks to review labs and his health history further.   No orders of the defined types were placed in this encounter.  Patient Instructions   Test was positive for blood in stool at last primary care provider's office. If colonoscopy was recent, and normal, then there may no additional testing needed at this time. I would like to discuss this further once I have more  information about the most recent colonoscopy. Please follow up in next few weeks to discuss this further.   I will check diabetes test, liver tests, cholesterol and electrolytes today. Please follow up to discuss these results in next few weeks as well.   Thanks for coming in today and it was nice meeting you.    IF you received an x-ray today, you will receive an invoice from [facility]. Please contact [facility] at [phone] with questions or concerns regarding your invoice.   IF you received labwork today, you will receive an invoice from [facility]. Please contact [facility] at [phone] with questions or concerns regarding your invoice.   Our billing staff will not be able to assist you with questions regarding bills from these companies.  You will be contacted with the lab results as soon as they are available. The fastest way to get your results is to activate your My Chart account. Instructions are located on the last page of this paperwork. If you have not heard from us regarding the results in 2 weeks, please contact this office.       I personally performed the Luna described in this documentation, which was scribed in my presence. The recorded information has been reviewed and considered for accuracy and completeness, addended by me as needed, and agree with information above.  Signed,   Meredith StaggersJeffrey Holdan Stucke, MD Primary Care at [facility].  07/29/17 1:56 PM

## 2017-07-27 LAB — LIPID PANEL
CHOLESTEROL TOTAL: 120 mg/dL (ref 100–199)
Chol/HDL Ratio: 3.6 ratio (ref 0.0–5.0)
HDL: 33 mg/dL — ABNORMAL LOW (ref >39)
LDL Calculated: 75 mg/dL (ref 0–99)
Triglycerides: 62 mg/dL (ref 0–149)
VLDL CHOLESTEROL CAL: 12 mg/dL (ref 5–40)

## 2017-07-27 LAB — COMPREHENSIVE METABOLIC PANEL
ALBUMIN: 4.2 g/dL (ref 3.5–4.8)
ALT: 18 IU/L (ref 0–44)
AST: 20 IU/L (ref 0–40)
Albumin/Globulin Ratio: 1.4 (ref 1.2–2.2)
Alkaline Phosphatase: 42 IU/L (ref 39–117)
BUN / CREAT RATIO: 12 (ref 10–24)
BUN: 32 mg/dL — AB (ref 8–27)
Bilirubin Total: 0.3 mg/dL (ref 0.0–1.2)
CO2: 18 mmol/L — AB (ref 20–29)
CREATININE: 2.71 mg/dL — AB (ref 0.76–1.27)
Calcium: 9.1 mg/dL (ref 8.6–10.2)
Chloride: 107 mmol/L — ABNORMAL HIGH (ref 96–106)
GFR calc non Af Amer: 22 mL/min/{1.73_m2} — ABNORMAL LOW (ref >59)
GFR, EST AFRICAN AMERICAN: 25 mL/min/{1.73_m2} — AB (ref >59)
GLUCOSE: 73 mg/dL (ref 65–99)
Globulin, Total: 2.9 g/dL (ref 1.5–4.5)
Potassium: 4.2 mmol/L (ref 3.5–5.2)
Sodium: 141 mmol/L (ref 134–144)
TOTAL PROTEIN: 7.1 g/dL (ref 6.0–8.5)

## 2017-07-27 LAB — HEMOGLOBIN A1C
Est. average glucose Bld gHb Est-mCnc: 120 mg/dL
HEMOGLOBIN A1C: 5.8 % — AB (ref 4.8–5.6)

## 2017-08-09 ENCOUNTER — Other Ambulatory Visit: Payer: Self-pay

## 2017-08-09 ENCOUNTER — Ambulatory Visit: Payer: Medicare Other | Admitting: Family Medicine

## 2017-08-09 ENCOUNTER — Encounter: Payer: Self-pay | Admitting: Family Medicine

## 2017-08-09 VITALS — BP 146/68 | HR 90 | Temp 99.1°F | Ht 68.0 in | Wt 146.8 lb

## 2017-08-09 DIAGNOSIS — E1122 Type 2 diabetes mellitus with diabetic chronic kidney disease: Secondary | ICD-10-CM | POA: Diagnosis not present

## 2017-08-09 DIAGNOSIS — E785 Hyperlipidemia, unspecified: Secondary | ICD-10-CM

## 2017-08-09 NOTE — Progress Notes (Signed)
Subjective:  By signing my name below, I, Stephen Luna, attest that this documentation has been prepared under the direction and in the presence of Stephen Agreste, MD Electronically Signed: Delton Luna Medical Scribe 08/09/2017 at 3:32 PM.   Patient ID: Stephen Luna, male    DOB: 31-Aug-1940, 77 y.o.   MRN: [id number] Chief Complaint  Patient presents with  . review labs    HPI Stephen Luna is a 77 y.o. male who presents to Primary Care at [facility] for review of labs. He was here July 16th to establish care, patient has a history of Hyperlipidemia, hypertension, CKD, CVA and DM.  DM Lab Results  Component Value Date   HGBA1C 5.8 (H) 07/26/2017  He is Amaryl 82m, in the mornings.   HTN BP Readings from Last 3 Encounters:  08/09/17 (!) 146/68  07/26/17 118/60   Lab Results  Component Value Date   CREATININE 2.71 (H) 07/26/2017  EGFR 25, unknown prior range, but followed by nephrology- Dr. AWillene Hatchet [phone]   HLD Lab Results  Component Value Date   ALT 18 07/26/2017   AST 20 07/26/2017   ALKPHOS 42 07/26/2017   BILITOT 0.3 07/26/2017   Lab Results  Component Value Date   CHOL 120 07/26/2017   HDL 33 (L) 07/26/2017   LDLCALC 75 07/26/2017   TRIG 62 07/26/2017   CHOLHDL 3.6 07/26/2017   He is currently taking 40 mg of Zocor a day. He has a follow up with nephrology soon. Patients care taker denies mention of dialysis. Patients care taker informs that they told him to watch diet.  History of Stroke Patient is taking Plavix 75 mg for stroke. No new bleeding.   There are no active problems to display for this patient.  No past medical history on file. No past surgical history on file. No Known Allergies Prior to Admission medications   Medication Sig Start Date End Date Taking? Authorizing Provider  ALPHAGAN P 0.1 % SOLN INSTILL 1 DROP INTO RIGHT EYE TWICE A DAY 06/07/17  Yes [provider]  amLODipine (NORVASC) 5 MG tablet Take 5 mg by mouth  daily. 05/28/17  Yes [provider]  bimatoprost (LUMIGAN) 0.01 % SOLN Frequency:   Dosage:0.0     Instructions:  Note: 06/28/12  Yes [provider]  brimonidine-timolol (COMBIGAN) 0.2-0.5 % ophthalmic solution Frequency:   Dosage:0.0     Instructions:  Note: 06/28/12  Yes [provider]  brinzolamide (AZOPT) 1 % ophthalmic suspension INSTILL 1 DROP INTO RIGHT EYE TWICE A DAY 06/28/12  Yes [provider]  Calcium Carb-Cholecalciferol 600-800 MG-UNIT TABS Frequency:   Dosage:0.0     Instructions:  Note: 06/28/12  Yes [provider]  clopidogrel (PLAVIX) 75 MG tablet TAKE 1 TABLET BY MOUTH EVERY DAY 04/13/16  Yes [provider]  cycloSPORINE (RESTASIS) 0.05 % ophthalmic emulsion Frequency:   Dosage:0.0     Instructions:  Note: 06/28/12  Yes [provider]  finasteride (PROSCAR) 5 MG tablet Take 5 mg by mouth daily. 07/04/17  Yes [provider]  glimepiride (AMARYL) 1 MG tablet TAKE 1 TABLET (1 MG TOTAL) BY MOUTH EVERY MORNING BEFORE BREAKFAST. 07/08/16  Yes [provider]  lisinopril-hydrochlorothiazide (PKennedy 20-12.5 MG tablet TAKE 1 TABLET BY MOUTH EVERY DAY 07/12/16  Yes [provider]  Multiple Vitamins-Minerals (ICAPS) CAPS Frequency:   Dosage:0.0     Instructions:  Note: 06/28/12  Yes [provider]  nitroGLYCERIN (NITROSTAT) 0.4 MG SL tablet  0.4 mg.   Yes [provider]  Omega-3 1000 MG CAPS Take by mouth. 10/02/15  Yes [provider]  prednisoLONE acetate (PRED FORTE) 1 % ophthalmic suspension  07/30/15  Yes [provider]  simvastatin (ZOCOR) 40 MG tablet TAKE 1 TABLET EVERY NIGHT 04/13/16  Yes [provider]  sodium bicarbonate 650 MG tablet Take 650 mg by mouth 2 (two) times daily. 05/28/17  Yes [provider]   Social History   Socioeconomic History  . Marital status: Single    Spouse name: Not on file  . Number of children: Not  on file  . Years of education: Not on file  . Highest education level: Not on file  Occupational History  . Not on file  Social Needs  . Financial resource strain: Not on file  . Food insecurity:    Worry: Not on file    Inability: Not on file  . Transportation needs:    Medical: Not on file    Non-medical: Not on file  Tobacco Use  . Smoking status: Never Smoker  . Smokeless tobacco: Never Used  Substance and Sexual Activity  . Alcohol use: Never    Frequency: Never  . Drug use: Never  . Sexual activity: Not Currently  Lifestyle  . Physical activity:    Days per week: Not on file    Minutes per session: Not on file  . Stress: Not on file  Relationships  . Social connections:    Talks on phone: Not on file    Gets together: Not on file    Attends religious service: Not on file    Active member of club or organization: Not on file    Attends meetings of clubs or organizations: Not on file    Relationship status: Not on file  . Intimate partner violence:    Fear of current or ex partner: Not on file    Emotionally abused: Not on file    Physically abused: Not on file    Forced sexual activity: Not on file  Other Topics Concern  . Not on file  Social History Narrative  . Not on file    Review of Systems As per HPI.     Objective:   Physical Exam  Constitutional: He is oriented to person, place, and time. He appears well-developed and well-nourished.  HENT:  Head: Normocephalic and atraumatic.  Eyes: Pupils are equal, round, and reactive to light. EOM are normal.  Neck: No JVD present. Carotid bruit is not present.  Cardiovascular: Normal rate, regular rhythm and normal heart sounds.  No murmur heard. Pulmonary/Chest: Effort normal and breath sounds normal. He has no rales.  Musculoskeletal: He exhibits no edema.  Neurological: He is alert and oriented to person, place, and time.  Skin: Skin is warm and dry.  Psychiatric: He has a normal mood and affect.    Vitals reviewed.  Vitals:   08/09/17 1447  BP: (!) 146/68  Pulse: 90  Temp: 99.1 F (37.3 C)  TempSrc: Oral  SpO2: 96%  Weight: 146 lb 12.8 oz (66.6 kg)  Height: '5\' 8"'$  (1.727 m)       Assessment & Plan:  Stephen Luna is a 77 y.o. male Type 2 diabetes mellitus with chronic kidney disease, without long-term current use of insulin, unspecified CKD stage ([place])  -Well-controlled by last A1c, tolerating sulfonylurea.  Denies any hypoglycemia symptoms.  Risk of hypoglycemia discussed, decided to continue same medications given history of CVA  and CKD but if any low/borderline low readings would stop sulfonylurea and treat with diet alone.   -For CKD, will try to obtain recent labs and notes with nephrologist, continue follow-up as planned.   Hyperlipidemia, unspecified hyperlipidemia type  -Tolerating statin, no changes for now.  Recheck in 3 months, advised to let me know if medication refills needed prior to that time.  No orders of the defined types were placed in this encounter.  Patient Instructions    Diabetes test looked okay, okay to continue same dose of glimepiride, but watch for any low blood sugar symptoms.  If those do occur stop that med.   I will try to obtain notes form prior kidney doctor to determine if recent test is in usual range. Please call and let me know their name so we can request those records.   Cholesterol was borderline elevated, but I would not recommend any changes for right now. okay to continue the simvastatin the same dose  - just continue to watch diet and we can recheck those levels in 6 months.   If any refills needed, have the pharmacy contact me.   Please follow up in 3 months.    IF you received an x-ray today, you will receive an invoice from [facility]. Please contact [facility] at [phone] with questions or concerns regarding your invoice.   IF you received labwork today, you will receive an invoice from  [place]. Please contact [facility] at [phone] with questions or concerns regarding your invoice.   Our billing staff will not be able to assist you with questions regarding bills from these companies.  You will be contacted with the lab results as soon as they are available. The fastest way to get your results is to activate your My Chart account. Instructions are located on the last page of this paperwork. If you have not heard from [place] regarding the results in 2 weeks, please contact this office.       I personally performed the services described in this documentation, which was scribed in my presence. The recorded information has been reviewed and considered for accuracy and completeness, addended by me as needed, and agree with information above.  Signed,   Merri Ray, MD Primary Care at [facility].  08/12/17 12:58 PM

## 2017-08-09 NOTE — Patient Instructions (Addendum)
  Diabetes test looked okay, okay to continue same dose of glimepiride, but watch for any low blood sugar symptoms.  If those do occur stop that med.   I will try to obtain notes form prior kidney doctor to determine if recent test is in usual range. Please call and let me know their name so we can request those records.   Cholesterol was borderline elevated, but I would not recommend any changes for right now. okay to continue the simvastatin the same dose  - just continue to watch diet and we can recheck those levels in 6 months.   If any refills needed, have the pharmacy contact me.   Please follow up in 3 months.    IF you received an x-ray today, you will receive an invoice from [facility]. Please contact [facility] at [phone] with questions or concerns regarding your invoice.   IF you received labwork today, you will receive an invoice from [facility]. Please contact [facility] at [phone] with questions or concerns regarding your invoice.   Our billing staff will not be able to assist you with questions regarding bills from these companies.  You will be contacted with the lab results as soon as they are available. The fastest way to get your results is to activate your My Chart account. Instructions are located on the last page of this paperwork. If you have not heard from us regarding the results in 2 weeks, please contact this office.

## 2017-08-11 ENCOUNTER — Telehealth: Payer: Self-pay

## 2017-08-11 NOTE — Telephone Encounter (Signed)
Copied from CRM [id number]. Topic: Inquiry >> Aug 10, 2017  4:28 PM Terisa Starraylor, Brittany L wrote: Reason for CRM: Stephen Luna ( nephew ) and said that Dr Chilton SiGreen wanted his kidney doctor's phone number. The number is [phone], Dr Cammy CopaAdegoroye  Message sent to Dr. Neva SeatGreene

## 2017-08-12 NOTE — Telephone Encounter (Signed)
Ok thanks. Please request records from his nephrologist - most recent 2 OV"s and labs should be ok for now.

## 2017-08-12 NOTE — Telephone Encounter (Signed)
Called nephrology, pt was not been in office since October of 2018. They will faxed what they do have from that date.

## 2017-08-19 ENCOUNTER — Telehealth: Payer: Self-pay | Admitting: Family Medicine

## 2017-08-19 NOTE — Telephone Encounter (Signed)
Left a voicemail for Mr. Stephen Luna in regards to an appt he has with Dr. Neva SeatGreene on 11/10/2017. The provider will not be in on that day and would need to reschedule.

## 2017-09-30 ENCOUNTER — Other Ambulatory Visit: Payer: Self-pay | Admitting: Family Medicine

## 2017-09-30 DIAGNOSIS — I10 Essential (primary) hypertension: Secondary | ICD-10-CM

## 2017-09-30 NOTE — Telephone Encounter (Signed)
finasteride refill Last Refill:07/26/17 (historical provider) Last OV: historical provider PCP: Dr Neva SeatGreene Pharmacy:[facility] [address]   Unable to find OV that addresses med

## 2017-10-01 NOTE — Telephone Encounter (Signed)
Recent OV. Refilled, and can discuss at next OV.

## 2017-11-10 ENCOUNTER — Ambulatory Visit: Payer: Medicare Other | Admitting: Family Medicine

## 2017-11-11 ENCOUNTER — Encounter: Payer: Self-pay | Admitting: Family Medicine

## 2017-11-11 ENCOUNTER — Ambulatory Visit: Payer: Medicare Other | Admitting: Family Medicine

## 2017-11-11 VITALS — BP 152/74 | HR 82 | Temp 97.6°F | Ht 68.0 in | Wt 149.4 lb

## 2017-11-11 DIAGNOSIS — E1122 Type 2 diabetes mellitus with diabetic chronic kidney disease: Secondary | ICD-10-CM | POA: Diagnosis not present

## 2017-11-11 DIAGNOSIS — Z8673 Personal history of transient ischemic attack (TIA), and cerebral infarction without residual deficits: Secondary | ICD-10-CM | POA: Diagnosis not present

## 2017-11-11 NOTE — Patient Instructions (Addendum)
  Please call nephrologist to schedule appointment as soon as possible.  I do not think he has been seen there since 2018.   I will check A1c today. No med changes for now. Let me know if any refills needed.   Recheck in 3 months, but if you are seen elsewhere let me know and we can send records.   If you have lab work done today you will be contacted with your lab results within the next 2 weeks.  If you have not heard from [place] then please contact us. The fastest way to get your results is to register for My Chart.   IF you received an x-ray today, you will receive an invoice from [facility]. Please contact [facility] at [phone] with questions or concerns regarding your invoice.   IF you received labwork today, you will receive an invoice from [place]. Please contact [facility] at [phone] with questions or concerns regarding your invoice.   Our billing staff will not be able to assist you with questions regarding bills from these companies.  You will be contacted with the lab results as soon as they are available. The fastest way to get your results is to activate your My Chart account. Instructions are located on the last page of this paperwork. If you have not heard from [place] regarding the results in 2 weeks, please contact this office.

## 2017-11-11 NOTE — Progress Notes (Addendum)
Subjective:  By signing my name below, I, [name], attest that this documentation has been prepared under the direction and in the presence of [name], MD. Electronically Signed: [name], Scribe. 11/11/2017 , 11:36 AM .  Patient was seen in Room 8 .   Patient ID: Stephen Luna, male    DOB: [birth year], 77 y.o.   MRN: [id number] Chief Complaint  Patient presents with  . Diabetes    f/u    HPI Stephen Luna is a 77 y.o. male Here for follow up of diabetes.   Diabetes with CKD Lab Results  Component Value Date   HGBA1C 5.8 (H) 07/26/2017   He was taking Amaryl 1 mg in the mornings at last visit. His CKD is followed by nephrologist, Dr. Cammy Copa, [phone]; last office visit in Oct 2018.   Eye doctor: has an appointment with [facility]; seen twice a year.  Urine micro albumin: followed by nephrology.  Pneumovax: June 2016  He checks his blood sugars at home, usually running around 120s. He's still taking Amaryl 1 mg qd. He is possibly returning to [place] in a few months living with his daughter, who has power of attorney.   HTN He takes Lisinopril-HCTZ, sodium bicarb, and Norvasc 5 mg qd. He is on a statin as well, simvastatin 40 mg qd.   BP Readings from Last 3 Encounters:  11/11/17 (!) 152/74  08/09/17 (!) 146/68  07/26/17 118/60    History of stroke He takes Plavix 75 mg qd; no new bleeding or blood in stool. No new weakness.   Health maintenance Flu shot: had received flu shot on 10/13/17.   There are no active problems to display for this patient.  No past medical history on file. No past surgical history on file. No Known Allergies Prior to Admission medications   Medication Sig Start Date End Date Taking? Authorizing Provider  ALPHAGAN P 0.1 % SOLN INSTILL 1 DROP INTO RIGHT EYE TWICE A DAY 06/07/17   [provider]  amLODipine (NORVASC) 5 MG tablet Take 5 mg by mouth daily. 05/28/17   [provider]    bimatoprost (LUMIGAN) 0.01 % SOLN Frequency:   Dosage:0.0     Instructions:  Note: 06/28/12   [provider]  brimonidine-timolol (COMBIGAN) 0.2-0.5 % ophthalmic solution Frequency:   Dosage:0.0     Instructions:  Note: 06/28/12   [provider]  brinzolamide (AZOPT) 1 % ophthalmic suspension INSTILL 1 DROP INTO RIGHT EYE TWICE A DAY 06/28/12   [provider]  Calcium Carb-Cholecalciferol 600-800 MG-UNIT TABS Frequency:   Dosage:0.0     Instructions:  Note: 06/28/12   [provider]  clopidogrel (PLAVIX) 75 MG tablet TAKE 1 TABLET BY MOUTH EVERY DAY 04/13/16   [provider]  cycloSPORINE (RESTASIS) 0.05 % ophthalmic emulsion Frequency:   Dosage:0.0     Instructions:  Note: 06/28/12   [provider]  finasteride (PROSCAR) 5 MG tablet TAKE 1 TABLET BY MOUTH EVERY DAY 10/01/17   Shade Flood, MD  glimepiride (AMARYL) 1 MG tablet TAKE 1 TABLET (1 MG TOTAL) BY MOUTH EVERY MORNING BEFORE BREAKFAST. 07/08/16   [provider]  lisinopril-hydrochlorothiazide (PRINZIDE,ZESTORETIC) 20-12.5 MG tablet TAKE 1 TABLET BY MOUTH EVERY DAY 07/12/16   [provider]  Multiple Vitamins-Minerals (ICAPS) CAPS Frequency:   Dosage:0.0     Instructions:  Note: 06/28/12   [provider]  nitroGLYCERIN (NITROSTAT) 0.4 MG SL tablet 0.4 mg.  [provider]  Omega-3 1000 MG CAPS Take by mouth. 10/02/15   [provider]  prednisoLONE acetate (PRED FORTE) 1 % ophthalmic suspension  07/30/15   [provider]  simvastatin (ZOCOR) 40 MG tablet TAKE 1 TABLET EVERY NIGHT 04/13/16   [provider]  sodium bicarbonate 650 MG tablet Take 650 mg by mouth 2 (two) times daily. 05/28/17   [provider]   Social History   Socioeconomic History  . Marital status: Single    Spouse name: Not on file  . Number of children: Not on file  . Years of education: Not on file  . Highest education level: Not on file   Occupational History  . Not on file  Social Needs  . Financial resource strain: Not on file  . Food insecurity:    Worry: Not on file    Inability: Not on file  . Transportation needs:    Medical: Not on file    Non-medical: Not on file  Tobacco Use  . Smoking status: Never Smoker  . Smokeless tobacco: Never Used  Substance and Sexual Activity  . Alcohol use: Never    Frequency: Never  . Drug use: Never  . Sexual activity: Not Currently  Lifestyle  . Physical activity:    Days per week: Not on file    Minutes per session: Not on file  . Stress: Not on file  Relationships  . Social connections:    Talks on phone: Not on file    Gets together: Not on file    Attends religious service: Not on file    Active member of club or organization: Not on file    Attends meetings of clubs or organizations: Not on file    Relationship status: Not on file  . Intimate partner violence:    Fear of current or ex partner: Not on file    Emotionally abused: Not on file    Physically abused: Not on file    Forced sexual activity: Not on file  Other Topics Concern  . Not on file  Social History Narrative  . Not on file   Review of Systems  Constitutional: Negative for fatigue and unexpected weight change.  Eyes: Negative for visual disturbance.  Respiratory: Negative for cough, chest tightness and shortness of breath.   Cardiovascular: Negative for chest pain, palpitations and leg swelling.  Gastrointestinal: Negative for abdominal pain and blood in stool.  Neurological: Negative for dizziness, light-headedness and headaches.       Objective:   Physical Exam  Constitutional: He is oriented to person, place, and time. He appears well-developed and well-nourished.  HENT:  Head: Normocephalic and atraumatic.  Eyes: Pupils are equal, round, and reactive to light. EOM are normal.  Neck: No JVD present. Carotid bruit is not present.  Cardiovascular: Normal rate, regular rhythm and  normal heart sounds.  No murmur heard. Pulmonary/Chest: Effort normal and breath sounds normal. He has no rales.  Musculoskeletal: He exhibits no edema.  Neurological: He is alert and oriented to person, place, and time.  Skin: Skin is warm and dry.  Psychiatric: He has a normal mood and affect.  Vitals reviewed.   Vitals:   11/11/17 1122  BP: (!) 152/74  Pulse: 82  Temp: 97.6 F (36.4 C)  TempSrc: Oral  SpO2: 98%  Weight: 149 lb 6.4 oz (67.8 kg)  Height: 5\' 8"  (1.727 m)       Assessment & Plan:  RACIEL CAFFREY is  a 77 y.o. male Type 2 diabetes mellitus with chronic kidney disease, without long-term current use of insulin, unspecified CKD stage (HCC) - Plan: Hemoglobin A1c  -Tolerating current regimen without reported hypoglycemia.  Continue same doses.  Follow-up in 3 months unless he has left town, then can send records where needed.   - due for nephrology follow up. Advised to call and schedule appointment as soon as possible.   History of CVA (cerebrovascular accident).   -Denies any newsymptoms or difficulty with current regimen.  No changes with plavix, statin or antihypertensive at this time although borderline elevated systolic.  Can monitor outside of office and recheck next visit.  No orders of the defined types were placed in this encounter.  Patient Instructions    Please call nephrologist to schedule appointment as soon as possible.  I do not think he has been seen there since 2018.   I will check A1c today. No med changes for now. Let me know if any refills needed.   Recheck in 3 months, but if you are seen elsewhere let me know and we can send records.   If you have lab work done today you will be contacted with your lab results within the next 2 weeks.  If you have not heard from [place] then please contact us. The fastest way to get your results is to register for My Chart.   IF you received an x-ray today, you will receive an invoice from [facility]  Radiology. Please contact [facility] at [phone] with questions or concerns regarding your invoice.   IF you received labwork today, you will receive an invoice from [facility]. Please contact [facility] at [phone] with questions or concerns regarding your invoice.   Our billing staff will not be able to assist you with questions regarding bills from these companies.  You will be contacted with the lab results as soon as they are available. The fastest way to get your results is to activate your My Chart account. Instructions are located on the last page of this paperwork. If you have not heard from [place] regarding the results in 2 weeks, please contact this office.       I personally performed the services described in this documentation, which was scribed in my presence. The recorded information has been reviewed and considered for accuracy and completeness, addended by me as needed, and agree with information above.  Signed,   [name], MD [facility].  11/13/17 9:49 AM

## 2017-11-12 LAB — HEMOGLOBIN A1C
Est. average glucose Bld gHb Est-mCnc: 120 mg/dL
HEMOGLOBIN A1C: 5.8 % — AB (ref 4.8–5.6)

## 2017-11-13 ENCOUNTER — Encounter: Payer: Self-pay | Admitting: Family Medicine

## 2017-11-21 ENCOUNTER — Encounter: Payer: Self-pay | Admitting: Family Medicine

## 2017-11-28 ENCOUNTER — Ambulatory Visit: Payer: No Typology Code available for payment source | Attending: Physician Assistant | Admitting: Physician Assistant

## 2017-11-28 ENCOUNTER — Encounter (RURAL_HEALTH_CENTER): Payer: Self-pay | Admitting: Physician Assistant

## 2017-11-28 ENCOUNTER — Telehealth (RURAL_HEALTH_CENTER): Payer: Self-pay

## 2017-11-28 VITALS — BP 130/76 | HR 58 | Temp 97.1°F | Resp 20 | Ht 66.0 in | Wt 147.0 lb

## 2017-11-28 DIAGNOSIS — I10 Essential (primary) hypertension: Secondary | ICD-10-CM

## 2017-11-28 DIAGNOSIS — Z7689 Persons encountering health services in other specified circumstances: Secondary | ICD-10-CM

## 2017-11-28 DIAGNOSIS — E7800 Pure hypercholesterolemia, unspecified: Secondary | ICD-10-CM

## 2017-11-28 DIAGNOSIS — E1122 Type 2 diabetes mellitus with diabetic chronic kidney disease: Secondary | ICD-10-CM | POA: Insufficient documentation

## 2017-11-28 DIAGNOSIS — N183 Chronic kidney disease, stage 3 (moderate): Secondary | ICD-10-CM

## 2017-11-28 DIAGNOSIS — H409 Unspecified glaucoma: Secondary | ICD-10-CM | POA: Insufficient documentation

## 2017-11-28 DIAGNOSIS — N401 Enlarged prostate with lower urinary tract symptoms: Secondary | ICD-10-CM

## 2017-11-28 DIAGNOSIS — I6930 Unspecified sequelae of cerebral infarction: Secondary | ICD-10-CM

## 2017-11-28 DIAGNOSIS — R001 Bradycardia, unspecified: Secondary | ICD-10-CM | POA: Insufficient documentation

## 2017-11-28 DIAGNOSIS — R3911 Hesitancy of micturition: Secondary | ICD-10-CM

## 2017-11-28 DIAGNOSIS — R195 Other fecal abnormalities: Secondary | ICD-10-CM | POA: Insufficient documentation

## 2017-11-28 DIAGNOSIS — R4701 Aphasia: Secondary | ICD-10-CM

## 2017-11-28 NOTE — Progress Notes (Signed)
Progress Note      Patient Name:  [name]    Chief Complaint   Patient presents with   . Establish Care       Subjective/HPI Comments:    Patient is a  77 y.o. male who presents with    Pt is nonverbal and hard of hearing   Pt doesn't drive  Old PCP- Dr Merri Ray at [facility] here from [place], [place]; he was living with his nephew Ace Bergfeld) for 6 years and Geni Bers (niece) was coming through area and said couldn't handle him anymore and niece refused to have pt go in nursing home so she came to get pt     Lives with niece- Geni Bers now (picked him up a week ago)  Pt dresses himself but niece bathes pt   Pt can feed himself only working with left hand    From prior paperwork has dx of chronic cerebrovascular accident, CKD stage 3, chronic renal impairment, HTN, hyperlipidemia, renal disorder associated with type 2 diabetes chronic and enlarged prostate (she brought summaries of care from 2015 and 2017 and 2019)    Pneumovax23  06/01/13    Diabetes:  DM Medication side effects: no  Hypoglycemic episodes: no  Polydipsia: no  Visual disturbance: yes  Chest pain: no  Paraesthesia: no  Glucose monitor: not checking   Diabetic foot exam: **See Screening Template.  Retinal examination: **See Screening Template.  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  Palpitations: no  Dyspnea: no  Chest pain: no  Lower extremity edema: no  Dizzy/lightheaded: no  Cardiovascular tests: not sure per niece   Specialist:     History of stroke with residual symptoms (not sure of year of stroke)-  right side upper extremity affected; right arm contracture; per niece pt could talk after stroke but then just stopped talking; pt nods head to communicate    Has cane to ambulate   No recent falls     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  Seen by eye specialist 11/17/17 and changed combigan to alphagan OD in 05/2017 due to low heart rate    Pt hasn't been followed by cardiology or neurology     BPH:  Duration: chronic   Current Status: stable  Nocturia: yes; once   Urinary frequency: no  Urinary hesitancy: no  Urinary incontinence: rare; little dribble on underclothes  Urinary urgency: no  Incomplete bladder emptying: no  Straining with urination: no  Unable to start stream or weak stream: no  Decrease or change in urinary stream: no  Blood in urine: no  Treatment: proscar 5mg  q hs   FMHX of prostate CA: no  PMHx of abnormal PSA: unknown; pt is non verbal     KIDNEY:  Recent illness/infection: no  Weakness: yes  Anemia: no  Decreased urine volume: no  Change or problems with urination: not to niece's knowledge  Hematuria: no  Recent vigorous exercise/trauma: no  Diabetes: yes  High blood pressure: yes  Congestive Heart Failure: no  Cancer: no  Autoimmune disease: no  NSAID use:  no  Specialist: used to see nephrology- last seen 2018    HEMATOLOGY:  Bleeding: no  Fatigue: no  Dyspnea on effort: no  Per July 2019 summary of care there was comment of heme positive stools  Niece called brother who cared for pt and he states pt had colonoscopy in 2018 for this issue and was normal     Review of Systems   Constitutional: Negative for activity change, appetite change, chills, fatigue, fever and unexpected weight change.   HENT: Positive for rhinorrhea (clear). Negative for congestion, ear pain, postnasal drip, sore throat and trouble swallowing.    Eyes: Positive for visual disturbance.   Respiratory: Positive for cough  (occasional). Negative for choking, chest tightness, shortness of breath and wheezing.    Cardiovascular: Negative for chest pain, palpitations and leg swelling.   Gastrointestinal: Negative for abdominal distention, abdominal pain, blood in stool, constipation, diarrhea, nausea and vomiting.   Endocrine: Negative for polydipsia, polyphagia and polyuria.   Genitourinary: Negative for difficulty urinating, dysuria, frequency, hematuria and urgency.   Musculoskeletal: Positive for gait problem (due to stroke; uses cane to ambulate).   Skin: Positive for wound (small sore on left hand but no discharge or erythema).   Neurological: Positive for facial asymmetry (right ptosis), speech difficulty (pt is non verbal) and weakness (due to residual stroke). Negative for dizziness, tremors, syncope, numbness and headaches.   Hematological: Negative for adenopathy. Does not bruise/bleed easily.   Psychiatric/Behavioral: Negative for sleep disturbance.     Outpatient Medications Marked as Taking for the 11/28/17 encounter (Office Visit) with Louanne Belton, PA   Medication Sig Dispense Refill   . amLODIPine (NORVASC) 5 MG tablet Take 5 mg by mouth daily     . bimatoprost (LUMIGAN) 0.01 % Solution Apply to eye     . brimonidine (ALPHAGAN P) 0.1 % Solution 1 drop 3 (three) times daily     . brinzolamide (AZOPT) 1 % ophthalmic suspension 1 drop 3 (three) times daily     . clopidogrel (PLAVIX) 75 mg tablet Take 75 mg by mouth daily     . diphenhydrAMINE HCl (BENADRYL ALLERGY PO) Take 10 mg by mouth     . finasteride (PROSCAR) 5 MG tablet Take 5 mg by mouth daily     . glimepiride (AMARYL) 1 MG tablet Take 1 mg by mouth every morning before breakfast     . lisinopril-hydroCHLOROthiazide (PRINZIDE,ZESTORETIC) 20-12.5 MG per tablet Take 1 tablet by mouth daily     . Multiple Vitamins-Minerals (CENTRUM ADULTS) Tab Take by mouth     . Multiple Vitamins-Minerals (ICAPS) Cap Take by mouth     . Omega-3 Fatty Acids (FISH OIL) 1200 MG Cap  Take by mouth     . prednisoLONE sodium phosphate (INFLAMASE FORTE) 1 % ophthalmic solution 1 drop 4 (four) times daily     . simvastatin (ZOCOR) 40 MG tablet Take 40 mg by mouth nightly     . SODIUM BICARBONATE, ANTACID, PO Take by mouth     . UNABLE TO FIND Med Name: Altropine Sulfate Ophthalmic solution-4 times daily     . vitamin D (CHOLECALCIFEROL) 25 MCG (1000 UT) tablet Take 25 mcg by mouth daily       No Known Allergies  Past Medical History:   Diagnosis Date   . Cataracts, bilateral    . Stroke  Past Surgical History:   Procedure Laterality Date   . EYE SURGERY     . right eye transplant      unsuccessful     Social History     Occupational History   . Not on file   Tobacco Use   . Smoking status: Not on file   Substance and Sexual Activity   . Alcohol use: Not on file   . Drug use: Not on file   . Sexual activity: Not on file     History reviewed. No pertinent family history.    The following portions of the patient's history were reviewed and updated as appropriate: allergies, current medications, past family history, past medical history, past social history, past surgical history and problem list.    BP 130/76 (BP Site: Left arm, Patient Position: Sitting, Cuff Size: Large)   Pulse (!) 58   Temp 97.1 F (36.2 C) (Tympanic)   Resp 20   Ht 1.676 m (5\' 6" )   Wt 66.7 kg (147 lb)   SpO2 99%   BMI 23.73 kg/m   Physical Exam  Vitals signs reviewed.   Constitutional:       General: He is not in acute distress.     Appearance: Normal appearance. He is well-developed.   HENT:      Head: Normocephalic and atraumatic.      Right Ear: Tympanic membrane, ear canal and external ear normal.      Left Ear: Tympanic membrane, ear canal and external ear normal.      Nose: Nose normal. No mucosal edema or rhinorrhea.      Mouth/Throat:      Lips: Pink.      Mouth: Mucous membranes are moist.      Pharynx: Uvula midline.   Eyes:      Conjunctiva/sclera: Conjunctivae normal.      Pupils: Pupils are equal, round,  and reactive to light.      Comments: Eye lids normal except, Right ptosis noted   Clear eye discharge noted, bilat    Right cataract noted    Neck:      Musculoskeletal: Neck supple.      Thyroid: No thyroid mass or thyromegaly.      Vascular: No carotid bruit.      Trachea: Trachea normal.   Cardiovascular:      Rate and Rhythm: Regular rhythm. Bradycardia present.      Pulses: Normal pulses.      Heart sounds: Normal heart sounds.   Pulmonary:      Effort: Pulmonary effort is normal. No accessory muscle usage or respiratory distress.      Breath sounds: Normal breath sounds. No wheezing, rhonchi or rales.   Abdominal:      General: Bowel sounds are normal. There is no distension.      Palpations: Abdomen is soft. There is no hepatomegaly, splenomegaly or mass.      Tenderness: There is no tenderness. There is no guarding or rebound.      Hernia: No hernia is present.   Lymphadenopathy:      Cervical: No cervical adenopathy.   Skin:     General: Skin is warm.      Comments: approx 5-23mm round sore noted along dorsal aspect of left hand along proximal 1st MCP without erythema or discharge; slight scaling noted   Neurological:      Mental Status: He is alert.      Motor: Weakness and atrophy (right arm)  present.      Gait: Gait abnormal (uses cane; walks slowly).      Deep Tendon Reflexes:      Reflex Scores:       Bicep reflexes are 2+ on the left side.       Brachioradialis reflexes are 2+ on the right side and 2+ on the left side.       Patellar reflexes are 2+ on the right side and 2+ on the left side.       Achilles reflexes are 2+ on the right side and 2+ on the left side.     Comments: Pt has focal deficit- keeps right arm contracture at elbow;  cannot extend right arm with passive ROM; poor grip strength, bilat     Pt is nonverbal and shakes head at times when asking questions but then other times fails to respond; niece provided majority of history     Unable to get right bicep DTR   Psychiatric:          Attention and Perception: Attention normal.         Mood and Affect: Mood and affect normal.         Behavior: Behavior is cooperative.         No results found for this or any previous visit (from the past 16800 hour(s)).       Reviewed labs/tests    Assessment:    1. Encounter to establish care    2. Type 2 diabetes mellitus with stage 3 chronic kidney disease, without long-term current use of insulin    3. Essential hypertension    4. Pure hypercholesterolemia    5. History of stroke with residual deficit    6. Bradycardia    7. Nonverbal    8. Benign prostatic hyperplasia with urinary hesitancy    9. Glaucoma, unspecified glaucoma type, unspecified laterality          Plan:    Orders Placed This Encounter   Procedures   . CBC and differential   . Comprehensive metabolic panel   . Hemoglobin A1C   . Microalbumin / Creatinine Ratio   . Lipid panel   . Ambulatory referral to Cardiology   . AMB REFERRAL TO NURSE NAVIGATORS   . Ambulatory referral to Nephrology   . Ambulatory referral to Neurology   . Ambulatory referral to Home Health   . ECG 12 lead       Outpatient Encounter Medications as of 11/28/2017   Medication Sig Dispense Refill   . amLODIPine (NORVASC) 5 MG tablet Take 5 mg by mouth daily     . bimatoprost (LUMIGAN) 0.01 % Solution Apply to eye     . brimonidine (ALPHAGAN P) 0.1 % Solution 1 drop 3 (three) times daily     . brinzolamide (AZOPT) 1 % ophthalmic suspension 1 drop 3 (three) times daily     . clopidogrel (PLAVIX) 75 mg tablet Take 75 mg by mouth daily     . diphenhydrAMINE HCl (BENADRYL ALLERGY PO) Take 10 mg by mouth     . finasteride (PROSCAR) 5 MG tablet Take 5 mg by mouth daily     . glimepiride (AMARYL) 1 MG tablet Take 1 mg by mouth every morning before breakfast     . lisinopril-hydroCHLOROthiazide (PRINZIDE,ZESTORETIC) 20-12.5 MG per tablet Take 1 tablet by mouth daily     . Multiple Vitamins-Minerals (CENTRUM ADULTS) Tab Take by mouth     . Multiple Vitamins-Minerals (ICAPS) Cap  Take  by mouth     . Omega-3 Fatty Acids (FISH OIL) 1200 MG Cap Take by mouth     . prednisoLONE sodium phosphate (INFLAMASE FORTE) 1 % ophthalmic solution 1 drop 4 (four) times daily     . simvastatin (ZOCOR) 40 MG tablet Take 40 mg by mouth nightly     . SODIUM BICARBONATE, ANTACID, PO Take by mouth     . UNABLE TO FIND Med Name: Altropine Sulfate Ophthalmic solution-4 times daily     . vitamin D (CHOLECALCIFEROL) 25 MCG (1000 UT) tablet Take 25 mcg by mouth daily       No facility-administered encounter medications on file as of 11/28/2017.          Follow up:  1. F/u 6wks (neurology/cardio- bradycardia/nephrology- CKD update)  2. 71mths (A1c/BMP)- order later  3. 70mths (DM/CKD- nephrology update)- order later  4. 82mths (A1c/CMP/FLP/PSA?)- order later  5. 73mths (DM/HTN/lipids/cerebrovascular disease- neuro update/CKD)- order later  6. Nov 2020 (BPH)- order later    Hx of glaucoma- follow up with optometrist (appt with Dr Rosalita Chessman on 12/05/17)    Per old records heme positive stool from July 2019; colonoscopy was done 2018 and negative per nephew so will try to get that report    Pt left before labs could be drawn today so he's to return tomorrow for lab draw    Was with pt for 40-60 min since pt nonverbal and trying to clarify information with his caregiver- niece    Claritin/flonase suggested for runny nose    Diabetes:  Continue Diabetic meds as prescribed    Continue blood glucose monitoring at home and record (FBS goal < 120 and 2hr PPBS goal < 180)  Check FBS qd and 2hr PPBS 1-2x/week; niece states has glucometer    Low blood sugar is a condition that happens when the level of sugar in a person's blood gets too low. Some symptoms of low blood sugar are mild, such as sweating or feeling hungry. Others are severe, such as passing out., confusion, trouble walking, blurred vision.    Recommend checking blood sugar if pt feels like it is dropping  Do not skip meals and suggested carrying glucose tablets if low blood sugar  occurs  Increasing protein in diet can help avoid low blood sugar    Yearly eye examination  Check feet for nonhealing sores/lesions  Annual flu shot recommended    HTN/LIPID/CEREBROVASCULAR DISEASE:  CAD Risk Factors discussed  Continue BP/Cardiac meds as prescribed  Continue monitoring BP at home  Encouraged good nutrition.  Low salt diet.  Regular physical activity and exercise  Avoid smoking and secondhand smoke  Annual flu shot recommended- states had one already this year  Discussed safe limits with alcohol    Cardio referral due to bradycardia (originally heart rate 42 but then rechecked and 58); hx of bradycardia from glaucoma meds per old records; will get updated EKG; questionable history of CAD since in old records there was script for nitro but per nephew/niece do not have bottle; will try to get old PCP records/any cardiac testing    Neurology referral written due to history of stroke  Home Health referral to see if he may benefit from PT/OT/speech therapy  Nurse Navigator referral to help with chronic issues    BPH:  Discussed treatment options for BPH including watchful waiting, pharmacotherapy     Reviewed BPH behavior modification recommendations:  -avoid nightime fluids  -avoid fluids prior to going out  -  reduce consumption of mild diuretics such as caffeine, ETOH  -double voiding to more fully empty bladder  Routine DRE/PSA screenings recommended, annually  Routine colonoscopy recommended  Continue medications as prescribed- proscar 5mg      KIDNEY:  Good BP/sugar control advised  Regular monitoring of renal function  Increase PO fluids (water)  Avoid NSAIDs  Nephrology referral written since pt last saw nephrology 2018    The patient has been informed of all ordered tests and/or consults, if applicable.  All patient concerns and questions have been addressed.      This note was completed using dragon medical speech recognition software. Grammatical errors, random word insertions, pronoun errors,  incorrect word insertion, misspellings  and incomplete sentences are occasional consequences of this technology due to software limitations. If there are questions or concerns about the content of this note or information contained within the body of this dictation they should be addressed with the provider for clarification.

## 2017-11-28 NOTE — Telephone Encounter (Signed)
Called niece per Judson Roch- pt left without pt getting bloodwork done. Will come in tomorrow morning for bloodwork

## 2017-11-29 ENCOUNTER — Ambulatory Visit
Admission: RE | Admit: 2017-11-29 | Discharge: 2017-11-29 | Disposition: A | Discharge status: Home / Self Care | Payer: No Typology Code available for payment source | Source: Ambulatory Visit

## 2017-11-29 ENCOUNTER — Ambulatory Visit
Admission: RE | Admit: 2017-11-29 | Discharge: 2017-11-29 | Disposition: A | Discharge status: Home / Self Care | Payer: No Typology Code available for payment source | Source: Ambulatory Visit | Attending: Physician Assistant | Admitting: Physician Assistant

## 2017-11-29 ENCOUNTER — Other Ambulatory Visit (RURAL_HEALTH_CENTER): Payer: Self-pay

## 2017-11-29 ENCOUNTER — Inpatient Hospital Stay: Admission: RE | Admit: 2017-11-29 | Payer: No Typology Code available for payment source | Source: Ambulatory Visit

## 2017-11-29 DIAGNOSIS — R001 Bradycardia, unspecified: Secondary | ICD-10-CM | POA: Insufficient documentation

## 2017-11-29 DIAGNOSIS — N183 Chronic kidney disease, stage 3 (moderate): Secondary | ICD-10-CM | POA: Insufficient documentation

## 2017-11-29 DIAGNOSIS — I6930 Unspecified sequelae of cerebral infarction: Secondary | ICD-10-CM | POA: Insufficient documentation

## 2017-11-29 DIAGNOSIS — E7800 Pure hypercholesterolemia, unspecified: Secondary | ICD-10-CM

## 2017-11-29 DIAGNOSIS — Z7689 Persons encountering health services in other specified circumstances: Secondary | ICD-10-CM

## 2017-11-29 DIAGNOSIS — E1122 Type 2 diabetes mellitus with diabetic chronic kidney disease: Secondary | ICD-10-CM

## 2017-11-29 DIAGNOSIS — R4701 Aphasia: Secondary | ICD-10-CM | POA: Insufficient documentation

## 2017-11-29 DIAGNOSIS — I129 Hypertensive chronic kidney disease with stage 1 through stage 4 chronic kidney disease, or unspecified chronic kidney disease: Secondary | ICD-10-CM | POA: Insufficient documentation

## 2017-11-29 DIAGNOSIS — I10 Essential (primary) hypertension: Secondary | ICD-10-CM

## 2017-11-29 LAB — LIPID PANEL
Cholesterol: 115 mg/dL (ref 75–199)
Coronary Heart Disease Risk: 3.83
HDL: 30 mg/dL — ABNORMAL LOW (ref 40–55)
LDL Calculated: 69 mg/dL
Triglycerides: 78 mg/dL (ref 10–150)
VLDL: 16 (ref 0–40)

## 2017-11-29 LAB — COMPREHENSIVE METABOLIC PANEL
ALT: 20 U/L (ref 0–55)
AST (SGOT): 23 U/L (ref 10–42)
Albumin/Globulin Ratio: 1.26 Ratio (ref 0.80–2.00)
Albumin: 3.9 gm/dL (ref 3.5–5.0)
Alkaline Phosphatase: 45 U/L (ref 40–145)
Anion Gap: 12.2 mMol/L (ref 7.0–18.0)
BUN / Creatinine Ratio: 12.3 Ratio (ref 10.0–30.0)
BUN: 38 mg/dL — ABNORMAL HIGH (ref 7–22)
Bilirubin, Total: 0.7 mg/dL (ref 0.1–1.2)
CO2: 26 mMol/L (ref 20–30)
Calcium: 9.4 mg/dL (ref 8.5–10.5)
Chloride: 105 mMol/L (ref 98–110)
Creatinine: 3.09 mg/dL — ABNORMAL HIGH (ref 0.80–1.30)
EGFR: 22 mL/min/{1.73_m2} — ABNORMAL LOW (ref 60–150)
Globulin: 3.1 gm/dL (ref 2.0–4.0)
Glucose: 87 mg/dL (ref 71–99)
Osmolality Calculated: 284 mOsm/kg (ref 275–300)
Potassium: 5.2 mMol/L (ref 3.5–5.3)
Protein, Total: 7 gm/dL (ref 6.0–8.3)
Sodium: 138 mMol/L (ref 136–147)

## 2017-11-29 LAB — CBC AND DIFFERENTIAL
Basophils %: 0.7 % (ref 0.0–3.0)
Basophils Absolute: 0.1 10*3/uL (ref 0.0–0.3)
Eosinophils %: 1.9 % (ref 0.0–7.0)
Eosinophils Absolute: 0.2 10*3/uL (ref 0.0–0.8)
Hematocrit: 41.9 % (ref 39.0–52.5)
Hemoglobin: 13.4 gm/dL (ref 13.0–17.5)
Lymphocytes Absolute: 1.9 10*3/uL (ref 0.6–5.1)
Lymphocytes: 23.9 % (ref 15.0–46.0)
MCH: 31 pg (ref 28–35)
MCHC: 32 gm/dL (ref 32–36)
MCV: 96 fL (ref 80–100)
MPV: 9.2 fL (ref 6.0–10.0)
Monocytes Absolute: 0.8 10*3/uL (ref 0.1–1.7)
Monocytes: 9.6 % (ref 3.0–15.0)
Neutrophils %: 63.9 % (ref 42.0–78.0)
Neutrophils Absolute: 5.1 10*3/uL (ref 1.7–8.6)
PLT CT: 212 10*3/uL (ref 130–440)
RBC: 4.37 10*6/uL (ref 4.00–5.70)
RDW: 11.9 % (ref 11.0–14.0)
WBC: 7.9 10*3/uL (ref 4.0–11.0)

## 2017-11-29 LAB — HEMOGLOBIN A1C: Hgb A1C, %: 5.6 %

## 2017-11-29 MED ORDER — CLOPIDOGREL BISULFATE 75 MG PO TABS
75.0000 mg | ORAL_TABLET | Freq: Every day | ORAL | 2 refills | Status: DC
Start: ? — End: 2017-11-29

## 2017-11-29 MED ORDER — AMLODIPINE BESYLATE 5 MG PO TABS
5.0000 mg | ORAL_TABLET | Freq: Every day | ORAL | 2 refills | Status: DC
Start: ? — End: 2017-11-29

## 2017-11-29 MED ORDER — SIMVASTATIN 40 MG PO TABS
40.0000 mg | ORAL_TABLET | Freq: Every evening | ORAL | 2 refills | Status: DC
Start: ? — End: 2017-11-29

## 2017-11-29 MED ORDER — LISINOPRIL-HYDROCHLOROTHIAZIDE 20-12.5 MG PO TABS
1.0000 | ORAL_TABLET | Freq: Every day | ORAL | 2 refills | Status: DC
Start: ? — End: 2017-11-29

## 2017-11-30 ENCOUNTER — Telehealth: Payer: Self-pay

## 2017-11-30 ENCOUNTER — Other Ambulatory Visit
Admission: RE | Admit: 2017-11-30 | Discharge: 2017-11-30 | Disposition: A | Discharge status: Home / Self Care | Payer: Medicare Other | Source: Ambulatory Visit | Attending: Physician Assistant | Admitting: Physician Assistant

## 2017-11-30 LAB — ECG 12-LEAD
P Wave Axis: -11 deg
P-R Interval: 165 ms
Patient Age: 77 years
Q-T Interval(Corrected): 398 ms
Q-T Interval: 356 ms
QRS Axis: -14 deg
QRS Duration: 75 ms
T Axis: 27 years
Ventricular Rate: 75 //min

## 2017-11-30 LAB — VH MICROALBUMIN / CREATININE RATIO
Microalbumin-Random, U: 56.6 ug/mL — ABNORMAL HIGH (ref 0.0–20.0)
Microalbumin/Creatinine Ratio: 28
Urine Creatinine Random: 204 mg/dL

## 2017-11-30 NOTE — Telephone Encounter (Signed)
PCP participates in Epic and updates were requested in care everywhere

## 2017-12-01 ENCOUNTER — Encounter (RURAL_HEALTH_CENTER): Payer: Self-pay | Admitting: Physician Assistant

## 2017-12-01 DIAGNOSIS — R809 Proteinuria, unspecified: Secondary | ICD-10-CM | POA: Insufficient documentation

## 2017-12-02 ENCOUNTER — Other Ambulatory Visit (RURAL_HEALTH_CENTER): Payer: Self-pay

## 2017-12-02 ENCOUNTER — Telehealth (RURAL_HEALTH_CENTER): Payer: Self-pay | Admitting: Physician Assistant

## 2017-12-02 ENCOUNTER — Encounter (RURAL_HEALTH_CENTER): Payer: Self-pay

## 2017-12-02 NOTE — Progress Notes (Signed)
Opened in duplicate

## 2017-12-02 NOTE — Telephone Encounter (Signed)
These let patient's caregiver know that I got his old PCP records from this past year; however he was a new patient to that office (Dr Carlota Raspberry at [facility]) this past year; he was seen by [facility] [address] on 05/30/17 per his latest PCP records    So they need them to sign another record release to get the last years notes and any cardiac testing including colonoscopy from [facility]    I've entered in his shot record    Thanks    Arias Weinert

## 2017-12-02 NOTE — Progress Notes (Signed)
Patient name: [name], [name]                                         MRN:  [id number]  Date of birth: [birth year]  No Known [place]      Based on multiple chronic conditions that place the patient at significant 12 month risk for morbidity and mortality, this patient would benefit from chronic care management services and care coordination, which may include but will not be limited to medication reconciliation, prescription refills, appointment scheduling, assistance with transportation to or from doctor appointment, nutritional guidance, escalation of care for specific symptoms, and disease specific guidance including preventive measures.      The patient is eligible for the Medicare covered benefit for Chronic Care Management (CCM). I discussed this with the patient, and have provided program information. CCM program and financial obligation/cost-sharing were explained to the patient. The patient understands that only one practitioner can furnish CCM services and be paid per calendar month.  The patient has the right to stop CCM services at any time. The patient consented verbally to enrollment in the [place]      Patient name: [name]                                         MRN:  [id number]  Date of birth: 15-Sep-1940                                          Communication Preferences: English    Advance Directives:     Yes has an advance directive - a copy HAS been provided    Pharmacy:     [id number] - [place], [place]  [address]  [place] [address]  Phone: [phone] Fax: [phone]      Care team:     Interdisciplinary Team Members:   Patient Care Team:  Dressler, Clarita Crane, MD as PCP - General (Family Medicine)  Lorain Childes, RN as Registered Nurse    Primary Care Physician: Berdine Dance Clarita Crane, MD    Others:          Medical Diagnosis:   Patient Active Problem  List    Diagnosis Date Noted   . 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 Glaucoma, unspecified glaucoma type, unspecified laterality 11/28/2017   . Pure hypercholesterolemia 11/28/2017   . Essential hypertension 11/28/2017   . Type 2 diabetes mellitus with stage 3 chronic kidney disease, without long-term current use of insulin 11/28/2017   . History of stroke with residual deficit 11/28/2017   . Bradycardia 11/28/2017   . Heme positive stool 11/28/2017   . Nonverbal 11/28/2017   . Benign prostatic hyperplasia with urinary hesitancy 11/28/2017        Past Surgical History:  Past Surgical History:   Procedure Laterality Date   . EYE SURGERY     . right eye transplant      unsuccessful  Patient Assessment:      During this visit, your current living situation was assessed.  You were found capable of N/A-receives care from family, friends or paid services as well as completing the activities of daily living (dressing, toileting, transferring (bed/chair), walking and completeing all activities of ADL per their capability), with the exception of driving and getting shoes on.      We have documented that you live with your niece and her husband.  and have the support of friends and family.  In the course of treatment, our care team has also discussed with you the following potential barriers in following the care planvisual impairment, hearing impairment and language barrier    Additional care management activities provided by : [facility] Patient Navigator, Kyra Leyland     Recommendations to Address Barriers:  New batteries for hearing aids.   Has opthalmology appointment Monday.  Geni Bers is POA and speaks for patient.           Allergies:  No Known Allergies      Medications:   Prior to Admission medications    Medication Sig Start Date End Date Taking? Authorizing Provider   amLODIPine (NORVASC) 5 MG tablet Take 1 tablet (5 mg total) by mouth daily 11/29/17   Schlenz, Leary Roca, PA   bimatoprost (LUMIGAN) 0.01 %  Solution Apply to eye    [provider]   brimonidine (ALPHAGAN P) 0.1 % Solution 1 drop 3 (three) times daily    [provider]   brinzolamide (AZOPT) 1 % ophthalmic suspension 1 drop 3 (three) times daily    [provider]   clopidogrel (PLAVIX) 75 mg tablet Take 1 tablet (75 mg total) by mouth daily 11/29/17   Schlenz, Leary Roca, PA   diphenhydrAMINE HCl (BENADRYL ALLERGY PO) Take 10 mg by mouth    [provider]   finasteride (PROSCAR) 5 MG tablet Take 5 mg by mouth daily    [provider]   glimepiride (AMARYL) 1 MG tablet Take 1 mg by mouth every morning before breakfast    [provider]   lisinopril-hydroCHLOROthiazide (PRINZIDE,ZESTORETIC) 20-12.5 MG per tablet Take 1 tablet by mouth daily 11/29/17   Elijio Miles, Leary Roca, PA   Multiple Vitamins-Minerals (CENTRUM ADULTS) Tab Take by mouth    [provider]   Multiple Vitamins-Minerals (ICAPS) Cap Take by mouth    [provider]   Omega-3 Fatty Acids (FISH OIL) 1200 MG Cap Take by mouth    [provider]   prednisoLONE sodium phosphate (INFLAMASE FORTE) 1 % ophthalmic solution 1 drop 4 (four) times daily    [provider]   simvastatin (ZOCOR) 40 MG tablet Take 1 tablet (40 mg total) by mouth nightly 11/29/17   Schlenz, Leary Roca, PA   SODIUM BICARBONATE, ANTACID, PO Take by mouth    [provider]   UNABLE TO FIND Med Name: Altropine Sulfate Ophthalmic solution-4 times daily    [provider]   vitamin D (CHOLECALCIFEROL) 25 MCG (1000 UT) tablet Take 25 mcg by mouth daily    [provider]                Health Maintenance:     There is no immunization history on file for this patient.  Health Maintenance   Topic Date Due   . FOOT EXAM  Mar 13, 1940   . DM OPHTHALMOLOGY EXAM  02/13/1951   . DEPRESSION SCREENING  02/12/1953   . ASPIRIN  THERAPY ASSESSMENT  02/13/1959   . Shingrix Vaccine 50+ (1) 02/13/1991   . [place]  02/12/2006    . Pneumonia Vaccine Age 52+ (2 of 2 - PPSV23) 06/02/2014   . INFLUENZA VACCINE  08/11/2017   . URINE MICROALBUMIN  12/01/2018       Education:  Nurse Navigation .     Self-Management and Goals:  Goals  Today's Progress  Recent Progress          Low Sodium / Dash DietNew  Assess         Increase water intakeNew  Assess         Improve Access to ResourcesNew  Assess             Establish Regular Follow-Ups with PCPNew  Assess         Consistently take Medications as PrescribedNew  Assess         Kyra Leyland, MSN BSN, [place], [facility]  Nurse Navigator   Phone: [phone]  [phone]  Fax [phone]  [email or web address]    Sent an E mail to [employer] for shower chair  Phoned Dr Kayleen Memos office re referral.

## 2017-12-06 ENCOUNTER — Encounter: Payer: Self-pay | Admitting: Nephrology

## 2017-12-06 DIAGNOSIS — N184 Chronic kidney disease, stage 4 (severe): Secondary | ICD-10-CM

## 2017-12-06 NOTE — Telephone Encounter (Signed)
error 

## 2017-12-06 NOTE — Telephone Encounter (Signed)
Release signed and will be sending.

## 2017-12-07 ENCOUNTER — Telehealth: Payer: Self-pay

## 2017-12-07 ENCOUNTER — Ambulatory Visit
Admission: RE | Admit: 2017-12-07 | Discharge: 2017-12-07 | Disposition: A | Discharge status: Home / Self Care | Payer: No Typology Code available for payment source | Source: Ambulatory Visit | Attending: Nephrology | Admitting: Nephrology

## 2017-12-07 DIAGNOSIS — N184 Chronic kidney disease, stage 4 (severe): Secondary | ICD-10-CM

## 2017-12-07 DIAGNOSIS — N400 Enlarged prostate without lower urinary tract symptoms: Secondary | ICD-10-CM | POA: Insufficient documentation

## 2017-12-07 DIAGNOSIS — N200 Calculus of kidney: Secondary | ICD-10-CM | POA: Insufficient documentation

## 2017-12-07 DIAGNOSIS — M184 Other bilateral secondary osteoarthritis of first carpometacarpal joints: Secondary | ICD-10-CM | POA: Insufficient documentation

## 2017-12-07 NOTE — Telephone Encounter (Signed)
Left message to scheduled new patient appt.  [name]

## 2017-12-13 ENCOUNTER — Telehealth (RURAL_HEALTH_CENTER): Payer: Self-pay | Admitting: Physician Assistant

## 2017-12-13 NOTE — Telephone Encounter (Signed)
Release sent

## 2017-12-13 NOTE — Telephone Encounter (Signed)
Per old records it said it was in [facility] and I found this on the Internet    [address]   [place] [address]    [phone]  [phone] Joylene Igo)    So I would confirm with POA    Thanks    Marcy Sookdeo

## 2017-12-15 ENCOUNTER — Telehealth: Payer: Self-pay | Admitting: Family Medicine

## 2017-12-15 NOTE — Telephone Encounter (Signed)
LVM for pt to call the office and reschedule appt that was scheduled 02/16/18 with Dr. Greene. Due to provider schedule change, we will need to reschedule pt. When pt calls in, please reschedule at pts convenience. Thank you! °

## 2017-12-16 ENCOUNTER — Ambulatory Visit (RURAL_HEALTH_CENTER): Payer: No Typology Code available for payment source

## 2017-12-16 DIAGNOSIS — N183 Chronic kidney disease, stage 3 (moderate): Secondary | ICD-10-CM

## 2017-12-16 DIAGNOSIS — R4701 Aphasia: Secondary | ICD-10-CM

## 2017-12-16 DIAGNOSIS — I6930 Unspecified sequelae of cerebral infarction: Secondary | ICD-10-CM

## 2017-12-16 DIAGNOSIS — E1122 Type 2 diabetes mellitus with diabetic chronic kidney disease: Secondary | ICD-10-CM

## 2017-12-16 DIAGNOSIS — I10 Essential (primary) hypertension: Secondary | ICD-10-CM

## 2017-12-16 NOTE — Progress Notes (Signed)
Delivered Shower Chair from [facility] to patient's Niece's home Monday am. Phoned Jaquline. She is using it too. Doing really fine.   Found him other insurance already. He has an appointment to see  Neurologist. Next month.         Kyra Leyland, MSN BSN, [place], [facility]  Nurse Navigator   Phone: [phone]  [phone]  Fax [phone]  [email or web address]

## 2017-12-23 ENCOUNTER — Other Ambulatory Visit: Payer: Self-pay | Admitting: Family Medicine

## 2017-12-23 DIAGNOSIS — I10 Essential (primary) hypertension: Secondary | ICD-10-CM

## 2017-12-29 ENCOUNTER — Emergency Department: Payer: Medicare Other

## 2017-12-29 ENCOUNTER — Inpatient Hospital Stay
Admission: EM | Admit: 2017-12-29 | Discharge: 2018-01-01 | DRG: 175 | Disposition: A | Discharge status: Home / Self Care | Payer: Medicare Other | Attending: Family Medicine | Admitting: Family Medicine

## 2017-12-29 DIAGNOSIS — I120 Hypertensive chronic kidney disease with stage 5 chronic kidney disease or end stage renal disease: Secondary | ICD-10-CM

## 2017-12-29 DIAGNOSIS — E1122 Type 2 diabetes mellitus with diabetic chronic kidney disease: Secondary | ICD-10-CM | POA: Diagnosis present

## 2017-12-29 DIAGNOSIS — Z7984 Long term (current) use of oral hypoglycemic drugs: Secondary | ICD-10-CM

## 2017-12-29 DIAGNOSIS — Z992 Dependence on renal dialysis: Secondary | ICD-10-CM

## 2017-12-29 DIAGNOSIS — N186 End stage renal disease: Secondary | ICD-10-CM

## 2017-12-29 DIAGNOSIS — R4182 Altered mental status, unspecified: Secondary | ICD-10-CM | POA: Diagnosis present

## 2017-12-29 DIAGNOSIS — G9341 Metabolic encephalopathy: Secondary | ICD-10-CM | POA: Diagnosis present

## 2017-12-29 DIAGNOSIS — E1136 Type 2 diabetes mellitus with diabetic cataract: Secondary | ICD-10-CM | POA: Diagnosis present

## 2017-12-29 DIAGNOSIS — Z902 Acquired absence of lung [part of]: Secondary | ICD-10-CM

## 2017-12-29 DIAGNOSIS — R0602 Shortness of breath: Secondary | ICD-10-CM

## 2017-12-29 DIAGNOSIS — Z8673 Personal history of transient ischemic attack (TIA), and cerebral infarction without residual deficits: Secondary | ICD-10-CM

## 2017-12-29 DIAGNOSIS — Z9489 Other transplanted organ and tissue status: Secondary | ICD-10-CM

## 2017-12-29 DIAGNOSIS — I6930 Unspecified sequelae of cerebral infarction: Secondary | ICD-10-CM

## 2017-12-29 DIAGNOSIS — E875 Hyperkalemia: Secondary | ICD-10-CM | POA: Diagnosis present

## 2017-12-29 DIAGNOSIS — N184 Chronic kidney disease, stage 4 (severe): Secondary | ICD-10-CM | POA: Diagnosis present

## 2017-12-29 DIAGNOSIS — Z7902 Long term (current) use of antithrombotics/antiplatelets: Secondary | ICD-10-CM

## 2017-12-29 DIAGNOSIS — I129 Hypertensive chronic kidney disease with stage 1 through stage 4 chronic kidney disease, or unspecified chronic kidney disease: Secondary | ICD-10-CM | POA: Diagnosis present

## 2017-12-29 DIAGNOSIS — I69320 Aphasia following cerebral infarction: Secondary | ICD-10-CM

## 2017-12-29 DIAGNOSIS — E11649 Type 2 diabetes mellitus with hypoglycemia without coma: Secondary | ICD-10-CM | POA: Diagnosis not present

## 2017-12-29 DIAGNOSIS — I2510 Atherosclerotic heart disease of native coronary artery without angina pectoris: Secondary | ICD-10-CM | POA: Diagnosis present

## 2017-12-29 DIAGNOSIS — I2699 Other pulmonary embolism without acute cor pulmonale: Principal | ICD-10-CM | POA: Diagnosis present

## 2017-12-29 LAB — COMPREHENSIVE METABOLIC PANEL
ALT: 29 U/L (ref 0–55)
AST (SGOT): 26 U/L (ref 10–42)
Albumin/Globulin Ratio: 1.2 Ratio (ref 0.80–2.00)
Albumin: 3.6 gm/dL (ref 3.5–5.0)
Alkaline Phosphatase: 53 U/L (ref 40–145)
Anion Gap: 12.4 mMol/L (ref 7.0–18.0)
BUN / Creatinine Ratio: 8.3 Ratio — ABNORMAL LOW (ref 10.0–30.0)
BUN: 23 mg/dL — ABNORMAL HIGH (ref 7–22)
Bilirubin, Total: 0.4 mg/dL (ref 0.1–1.2)
CO2: 24 mMol/L (ref 20–30)
Calcium: 8.5 mg/dL (ref 8.5–10.5)
Chloride: 105 mMol/L (ref 98–110)
Creatinine: 2.76 mg/dL — ABNORMAL HIGH (ref 0.80–1.30)
EGFR: 25 mL/min/{1.73_m2} — ABNORMAL LOW (ref 60–150)
Globulin: 3 gm/dL (ref 2.0–4.0)
Glucose: 138 mg/dL — ABNORMAL HIGH (ref 71–99)
Osmolality Calculated: 278 mOsm/kg (ref 275–300)
Potassium: 5.4 mMol/L — ABNORMAL HIGH (ref 3.5–5.3)
Protein, Total: 6.6 gm/dL (ref 6.0–8.3)
Sodium: 136 mMol/L (ref 136–147)

## 2017-12-29 LAB — CBC AND DIFFERENTIAL
Basophils %: 0.7 % (ref 0.0–3.0)
Basophils Absolute: 0 10*3/uL (ref 0.0–0.3)
Eosinophils %: 2.8 % (ref 0.0–7.0)
Eosinophils Absolute: 0.2 10*3/uL (ref 0.0–0.8)
Hematocrit: 38.8 % — ABNORMAL LOW (ref 39.0–52.5)
Hemoglobin: 13.1 gm/dL (ref 13.0–17.5)
Lymphocytes Absolute: 1.5 10*3/uL (ref 0.6–5.1)
Lymphocytes: 27 % (ref 15.0–46.0)
MCH: 32 pg (ref 28–35)
MCHC: 34 gm/dL (ref 32–36)
MCV: 95 fL (ref 80–100)
MPV: 7.9 fL (ref 6.0–10.0)
Monocytes Absolute: 0.6 10*3/uL (ref 0.1–1.7)
Monocytes: 11.6 % (ref 3.0–15.0)
Neutrophils %: 57.8 % (ref 42.0–78.0)
Neutrophils Absolute: 3.2 10*3/uL (ref 1.7–8.6)
PLT CT: 196 10*3/uL (ref 130–440)
RBC: 4.09 10*6/uL (ref 4.00–5.70)
RDW: 11.8 % (ref 11.0–14.0)
WBC: 5.5 10*3/uL (ref 4.0–11.0)

## 2017-12-29 LAB — ECG 12-LEAD
P Wave Axis: 45 deg
P-R Interval: 195 ms
Patient Age: 77 years
Q-T Interval(Corrected): 409 ms
Q-T Interval: 354 ms
QRS Axis: 11 deg
QRS Duration: 117 ms
T Axis: 54 years
Ventricular Rate: 80 //min

## 2017-12-29 LAB — I-STAT TROPONIN: Troponin I I-Stat: <0.02 ng/mL (ref 0.00–0.08)

## 2017-12-29 LAB — PT/INR
PT INR: 1 (ref 0.5–1.3)
PT: 10.6 s (ref 9.5–11.5)

## 2017-12-29 LAB — VH I-STAT TROPONIN NOTIFICATION

## 2017-12-29 LAB — CBC
Hematocrit: 33.9 % — ABNORMAL LOW (ref 39.0–52.5)
Hemoglobin: 11.9 gm/dL — ABNORMAL LOW (ref 13.0–17.5)
MCH: 33 pg (ref 28–35)
MCHC: 35 gm/dL (ref 32–36)
MCV: 94 fL (ref 80–100)
MPV: 7.9 fL (ref 6.0–10.0)
PLT CT: 170 10*3/uL (ref 130–440)
RBC: 3.61 10*6/uL — ABNORMAL LOW (ref 4.00–5.70)
RDW: 11.6 % (ref 11.0–14.0)
WBC: 6.5 10*3/uL (ref 4.0–11.0)

## 2017-12-29 LAB — D-DIMER, QUANTITATIVE: D-Dimer: 1.23 mg/L FEU — ABNORMAL HIGH (ref 0.19–0.52)

## 2017-12-29 LAB — B-TYPE NATRIURETIC PEPTIDE: B-Natriuretic Peptide: 10.3 pg/mL (ref 0.0–100.0)

## 2017-12-29 LAB — TROPONIN I: Troponin I: 0.01 ng/mL (ref 0.00–0.02)

## 2017-12-29 LAB — VH DEXTROSE STICK GLUCOSE: Glucose POCT: 74 mg/dL (ref 71–99)

## 2017-12-29 LAB — APTT: aPTT: 23.1 s — ABNORMAL LOW (ref 24.0–34.0)

## 2017-12-29 MED ORDER — SODIUM CHLORIDE 0.9 % IV SOLN
INTRAVENOUS | Status: DC
Start: 2017-12-29 — End: 2017-12-30

## 2017-12-29 MED ORDER — INSULIN LISPRO (1 UNIT DIAL) 100 UNIT/ML SC SOPN
1.00 [IU] | PEN_INJECTOR | Freq: Every day | SUBCUTANEOUS | Status: DC | PRN
Start: 2017-12-29 — End: 2018-01-01

## 2017-12-29 MED ORDER — SIMVASTATIN 10 MG PO TABS
20.0000 mg | ORAL_TABLET | Freq: Every evening | ORAL | Status: DC
Start: 2017-12-29 — End: 2018-01-01
  Administered 2017-12-29 – 2017-12-31 (×3): 20 mg via ORAL
  Filled 2017-12-29 (×4): qty 2

## 2017-12-29 MED ORDER — ACETAMINOPHEN 650 MG RE SUPP
650.00 mg | RECTAL | Status: DC | PRN
Start: 2017-12-29 — End: 2018-01-01

## 2017-12-29 MED ORDER — ATROPINE SULFATE 1 % OP SOLN
1.00 [drp] | Freq: Four times a day (QID) | OPHTHALMIC | Status: DC
Start: 2017-12-30 — End: 2018-01-01
  Administered 2017-12-30 – 2018-01-01 (×9): 1 [drp] via OPHTHALMIC
  Filled 2017-12-29: qty 2

## 2017-12-29 MED ORDER — INSULIN LISPRO (1 UNIT DIAL) 100 UNIT/ML SC SOPN
1.00 [IU] | PEN_INJECTOR | Freq: Three times a day (TID) | SUBCUTANEOUS | Status: DC
Start: 2017-12-30 — End: 2018-01-01

## 2017-12-29 MED ORDER — DEXTROSE 10 % IV BOLUS
125.00 mL | INTRAVENOUS | Status: DC | PRN
Start: 2017-12-29 — End: 2018-01-01
  Administered 2017-12-30 – 2017-12-31 (×4): 125 mL via INTRAVENOUS
  Filled 2017-12-29 (×3): qty 250

## 2017-12-29 MED ORDER — GLUCAGON 1 MG IJ SOLR (WRAP)
1.00 mg | INTRAMUSCULAR | Status: DC | PRN
Start: 2017-12-29 — End: 2018-01-01

## 2017-12-29 MED ORDER — ACETAMINOPHEN 325 MG PO TABS
650.0000 mg | ORAL_TABLET | ORAL | Status: DC | PRN
Start: 2017-12-29 — End: 2018-01-01

## 2017-12-29 MED ORDER — VH HEPARIN SODIUM (PORCINE) 10000 UNIT/ML IJ SOLN (REBOLUS)
2000.00 [IU] | Freq: Four times a day (QID) | INTRAMUSCULAR | Status: DC | PRN
Start: 2017-12-29 — End: 2017-12-31

## 2017-12-29 MED ORDER — ACETAMINOPHEN 160 MG/5ML PO SOLN
650.00 mg | ORAL | Status: DC | PRN
Start: 2017-12-29 — End: 2018-01-01

## 2017-12-29 MED ORDER — AMLODIPINE BESYLATE 5 MG PO TABS
5.0000 mg | ORAL_TABLET | Freq: Every day | ORAL | Status: DC
Start: 2017-12-30 — End: 2018-01-01
  Administered 2017-12-30 – 2018-01-01 (×3): 5 mg via ORAL
  Filled 2017-12-29 (×3): qty 1

## 2017-12-29 MED ORDER — FINASTERIDE 5 MG PO TABS
5.0000 mg | ORAL_TABLET | Freq: Every morning | ORAL | Status: DC
Start: 2017-12-30 — End: 2018-01-01
  Administered 2017-12-30 – 2018-01-01 (×3): 5 mg via ORAL
  Filled 2017-12-29 (×3): qty 1

## 2017-12-29 MED ORDER — NALOXONE HCL 0.4 MG/ML IJ SOLN (WRAP)
0.40 mg | INTRAMUSCULAR | Status: DC | PRN
Start: 2017-12-29 — End: 2018-01-01

## 2017-12-29 MED ORDER — SODIUM BICARBONATE 650 MG PO TABS
650.0000 mg | ORAL_TABLET | Freq: Two times a day (BID) | ORAL | Status: DC
Start: 2017-12-29 — End: 2018-01-01
  Administered 2017-12-29 – 2018-01-01 (×6): 650 mg via ORAL
  Filled 2017-12-29 (×7): qty 1

## 2017-12-29 MED ORDER — CLOPIDOGREL BISULFATE 75 MG PO TABS
75.0000 mg | ORAL_TABLET | Freq: Every day | ORAL | Status: DC
Start: 2017-12-30 — End: 2018-01-01
  Administered 2017-12-30 – 2018-01-01 (×3): 75 mg via ORAL
  Filled 2017-12-29 (×3): qty 1

## 2017-12-29 MED ORDER — VH HEPARIN (PORCINE) IN D5W 50-5 UNIT/ML-% IV SOLN
0.00 [IU]/h | INTRAVENOUS | Status: DC
Start: 2017-12-29 — End: 2017-12-31
  Administered 2017-12-29: 23:00:00 950 [IU]/h via INTRAVENOUS
  Administered 2017-12-31: 14:00:00 700 [IU]/h via INTRAVENOUS
  Filled 2017-12-29 (×2): qty 500

## 2017-12-29 MED ORDER — TECHNETIUM TC 99M PENTETATE AEROSOL
30.0000 | PACK | Freq: Once | INTRAVENOUS | Status: AC | PRN
Start: 2017-12-29 — End: 2017-12-29
  Administered 2017-12-29: 18:00:00 32.6 via RESPIRATORY_TRACT

## 2017-12-29 MED ORDER — VH HEPARIN SODIUM (PORCINE) 10000 UNIT/ML IJ SOLN (INITIAL BOLUS)
5000.00 [IU] | Freq: Once | INTRAMUSCULAR | Status: AC
Start: 2017-12-29 — End: 2017-12-29
  Administered 2017-12-29: 23:00:00 5000 [IU] via INTRAVENOUS
  Filled 2017-12-29: qty 1

## 2017-12-29 MED ORDER — INSULIN LISPRO (1 UNIT DIAL) 100 UNIT/ML SC SOPN
1.00 [IU] | PEN_INJECTOR | Freq: Every evening | SUBCUTANEOUS | Status: DC
Start: 2017-12-29 — End: 2018-01-01

## 2017-12-29 MED ORDER — TECHNETIUM TC 99M ALBUMIN AGGREGATED
5.00 | Freq: Once | Status: AC | PRN
Start: 2017-12-29 — End: 2017-12-29
  Administered 2017-12-29: 18:00:00 5.3 via INTRAVENOUS

## 2017-12-29 MED ORDER — LISINOPRIL 10 MG PO TABS
20.0000 mg | ORAL_TABLET | Freq: Every day | ORAL | Status: DC
Start: 2017-12-30 — End: 2017-12-29

## 2017-12-29 MED ORDER — SODIUM CHLORIDE (PF) 0.9 % IJ SOLN
3.00 mL | Freq: Three times a day (TID) | INTRAMUSCULAR | Status: DC
Start: 2017-12-29 — End: 2018-01-01
  Administered 2017-12-29 – 2018-01-01 (×7): 3 mL via INTRAVENOUS

## 2017-12-29 MED ORDER — SODIUM CHLORIDE 0.9 % IV SOLN
INTRAVENOUS | Status: DC
Start: 2017-12-29 — End: 2017-12-29

## 2017-12-29 NOTE — ED Notes (Signed)
Bed: C13-A  Expected date:   Expected time:   Means of arrival:   Comments:  [place]

## 2017-12-29 NOTE — ED Notes (Signed)
Report given to accepting nurse. Patient stable for transport.

## 2017-12-29 NOTE — ED Provider Notes (Signed)
[facility]  History and Physical Exam       Patient Name: [name], [name]  Encounter Date:  12/29/2017  Attending Physician: Mali Stevi Hollinshead, MD  PCP: Melvyn Neth, MD  Patient DOB:  1940/09/24  MRN:  [id number]  Room:  C13/C13-A      History of Presenting Illness     Chief complaint: Shortness of Breath    HPI/ROS is limited by: altered mental status  HPI/ROS given by: Don Perking is a 77 y.o. male who presents with Shortness of breath. The niece of the patient tells the story since the patient is nonverbal. The niece states that at physical therapy at 1PM today, the patient having weakness in the lower extremities. She states that he then started drooling and having SOB. She said that within 10 minutes, he had 3 episodes of drooling, trembling, and SOB, each episode lasting 2-3 minutes each. He was holding his chest and his head like he was in pain during the episodes.  She states that he also has a dry cough. She states that he has stage 4 kidney failure, and weakness on the right side from a previous stroke. She states that he also has heart issues. She states that he is "normal" now.        Review of Systems     Review of Systems   Unable to perform ROS: Patient nonverbal   HENT: Positive for drooling.    Respiratory: Positive for cough (dry) and shortness of breath.    Cardiovascular: Positive for chest pain.   Neurological: Positive for tremors, weakness (lower extremities ) and headaches.        Allergies & Medications     Pt has No Known Allergies.    Home Meds: EMR link not correct; nurses' notes reviewed for meds and dosages     Past Medical History     Pt has a past medical history of Cataracts, bilateral, Diabetes mellitus, Hypertension, Kidney disorder, and Stroke.     Past Surgical History     Pt  has a past surgical history that includes Eye surgery; right eye transplant; and Lung lobectomy.     Family History     The family history is not on  file.     Social History     Pt reports that he has never smoked. He has never used smokeless tobacco. He reports that he does not drink alcohol or use drugs.     Physical Exam     Blood pressure 123/74, pulse 69, temperature 98.2 F (36.8 C), temperature source Oral, resp. rate (!) 24, SpO2 100 %.    Physical Exam  Constitutional:       General: He is not in acute distress.     Appearance: He is well-developed. He is not diaphoretic.   HENT:      Head: Normocephalic and atraumatic.   Eyes:      Pupils: Pupils are equal, round, and reactive to light.   Neck:      Musculoskeletal: Normal range of motion and neck supple.   Cardiovascular:      Rate and Rhythm: Normal rate and regular rhythm.      Heart sounds: Normal heart sounds. No murmur. No friction rub. No gallop.    Pulmonary:      Effort: Pulmonary effort is normal. No respiratory distress.      Breath sounds: Normal breath sounds. No wheezing or rales.  Abdominal:      General: Bowel sounds are normal. There is no distension.      Palpations: Abdomen is soft. There is no mass.      Tenderness: There is no abdominal tenderness. There is no guarding or rebound.      Hernia: No hernia is present.   Musculoskeletal:         General: No deformity.   Skin:     General: Skin is warm and dry.      Findings: No erythema or rash.   Neurological:      Mental Status: He is disoriented.      Cranial Nerves: No cranial nerve deficit.      Motor: No abnormal muscle tone.      Comments: Right arm weakness, family states is chronic.    Psychiatric:         Speech: He is noncommunicative.      Comments: Does not follow commands            Diagnostic Results     The results of the diagnostic studies below have been reviewed by myself:    Labs  Labs Reviewed   COMPREHENSIVE METABOLIC PANEL - Abnormal; Notable for the following components:       Result Value    Potassium 5.4 (*)     Glucose 138 (*)     Creatinine 2.76 (*)     BUN 23 (*)     BUN/Creatinine Ratio 8.3 (*)     EGFR  25 (*)     All other components within normal limits   CBC AND DIFFERENTIAL - Abnormal; Notable for the following components:    Hematocrit 38.8 (*)     All other components within normal limits   D-DIMER, QUANTITATIVE - Abnormal; Notable for the following components:    D-Dimer 1.23 (*)     All other components within normal limits   TROPONIN I   B-TYPE NATRIURETIC PEPTIDE   VH I-STAT TROPONIN NOTIFICATION   I-STAT TROPONIN       Radiologic Studies  Mri Brain Wo Contrast Panel    Result Date: 12/29/2017  Abnormal MRI scan of the head demonstrating: 1. There are no acute intracranial lesions. 2. There are chronic focal infarctions within the right and left paramedian basis pontis. 3. There is a chronic infarction of the left corona radiata extending to the centrum semiovale. 4. There is moderate to severe cortical and subcortical atrophy. This is asymmetric and more severe involving the left compare the right hemisphere. This asymmetry can suggest a neurodegenerative process such as primary progressive aphasia. ReadingStation:[id number]    Nm Pulmonary Ventilation And Perfusion (aerosol Or Gas)    Result Date: 12/29/2017  2 small subsegmental mismatched defects are considered intermediate probability for pulmonary emboli by modified PIOPED II criteria. ReadingStation:[id number]    Xr Chest Ap Portable    Result Date: 12/29/2017  Hypoventilatory study without evidence of acute intrathoracic pathology. ReadingStation:[id number]      EKG: Sinus rhythm   Atrial premature complex   Compared to ECG 11/29/2017 11:49:50   Atrial premature complex(es) now present   Ventricular premature complex(es) no longer present      ED Course & Treatment     D/dx, workup, anticipated clinical course discussed with patient/family.  Results reviewed with patient/family.  Patient appreciative of care.  All questions answered.  Patient/family comfortable with treatment plan.  Case reviewed with consultant; history, physical exam, ancillary  studies, and plan of care discussed.  1835  Results explained.  All questions answered.  Pt is comfortable w/ disposition.      [place] Dr. Corbin Ade will admit    77 year old male presents with episode of shortness of breath, chest pain, headache, and drooling.  History of CVA.  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  D-dimer is elevated.  VQ scan is indeterminate for pulmonary embolus bilaterally.  IV heparin started.  Discussed with medicine.  Admit.       Medical Decision Making     DDx: CVA, ACS, pulmonary embolus, pneumothorax, pericarditis, seizure, UTI    This chart was generated by an EMR, and parts may have been dictated through an electronic transcription program, and so this chart may contain errors or omissions not intended by the user.     Procedures / Critical Care     None     Diagnosis / Disposition     Clinical Impression  1. Bilateral pulmonary embolism    2. Acute hyperkalemia    3. Peritoneal dialysis status    4. ESRF (end stage renal failure)    5. History of CVA with residual deficit        Disposition  ED Disposition     ED Disposition Condition Date/Time Comment    Observation  Thu Dec 29, 2017  8:28 PM Admitting Physician: Guy Begin A [[id number]]   Diagnosis: Altered mental status [780.97.ICD-9-CM]   Estimated Length of Stay: < 2 midnights   Tentative Discharge Plan?: Home or Self Care [1]   Patient Class: Observation [315]            Follow up for Discharged Patients  No follow-up provider specified.    Prescriptions for Discharged Patients  New Prescriptions    No medications on file       Benjamin Stain was my scribe today and assisted me with documentation only.  She was present during the questioning and physical examination of this patient and documented what he observed and was instructed to document.  This note accurately reflects work and decisions made by me.                Renold Kozar, Mali B, MD  12/29/17 2100

## 2017-12-29 NOTE — ED Triage Notes (Signed)
Per family member pt. C/o SOB, shaking and "foaming at mouth" during PT today (riding stationary bicycle). Family states had 2-3 episodes  Lasting 5 seconds. Family states "he put his hands to his head"The patient has a history of stroke. On plavix. Pt. Just started PT today. Has only been with current family member for 4 months. Pt is non verbal.

## 2017-12-29 NOTE — H&P (Addendum)
Medicine History & Physical   [facility]   Patient Name: [name], [name] LOS: 0 days   Attending Physician: Dunn, Mali B, MD PCP: Melvyn Neth, MD      Assessment and Plan:                                                              1.  Transient altered mental status with drooling with shortness of breathing  Elevated d-dimer MRI brain revealed old stroke  Could be seizure/related to underlying PE/cardiac arrhythmia  For seizure seizure precaution ordered EEG ordered  VQ scan intermediate probability we will continue IV heparin  Follow-up venous Doppler  Follow-up transthoracic echocardiogram  tele monitor avoid beta-blocker patient had adverse reaction to atenolol in the past with heart block.  N.p.o. speech and swallow evaluation to rule out aspiration  consult neurology in a.m.  2.  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  Hypertension potassium was 5.4 I will hold lisinopril for now  We will continue amlodipine  PRN hydralazine if elevated blood pressure.  5. Diabetes mellitus hold oral hypoglycemics  Add sliding scale insulin for now  6.  Single-vessel CAD continue aspirin statin Plavix  DVT PPx: on iv heparin   Dispo: floor  Healthcare Proxy: niece  Code: full code  Addendum created at around 68 58 pm ultrasound lower extremity showed high suspicion of left leg DVT patient is on IV heparin.     History of Presenting Illness                                CC: Altered mental status  [name] is a 77 y.o. male patient   Medical history of old stroke, CAD, diabetes mellitus, hypertension,stage 4 CKD that is come to ED with above-mentioned complaints.  Physical therapy was started earlier today at that time while patient was walking suddenly became confused drooling of  saliva with shortness of breathing holding his head and chest.  Patient has baseline nonverbal unable to get detailed history from the patient.  Denies any bowel bladder incontinence or tongue bite.  Denies any abdominal pain nausea vomiting diarrhea.  In ED patient remained hemodynamically stable potassium 5.4 troponin x2- BNP normal chest x-ray was unremarkable MRI brain negative for acute stroke.  VQ scan intermediate probability d-dimer 1.2  Past Medical History:   Diagnosis Date   . Cataracts, bilateral    . Diabetes mellitus    . Hypertension    . Kidney disorder     stage IV kidney disease   . Stroke      Past Surgical History:   Procedure Laterality Date   . EYE SURGERY     . LUNG LOBECTOMY     . right eye transplant      unsuccessful       History reviewed. No pertinent family history.  Social History     Socioeconomic History   . Marital status: Divorced     Spouse name: None   . Number of children:  0   . Years of education: None   . Highest education level: 12th grade   Occupational History   . None   Social Needs   . Financial resource strain: Not hard at all   . Food insecurity:     Worry: Never true     Inability: Never true   . Transportation needs:     Medical: No     Non-medical: No   Tobacco Use   . Smoking status: Never Smoker   . Smokeless tobacco: Never Used   Substance and Sexual Activity   . Alcohol use: Never     Frequency: Never   . Drug use: Never   . Sexual activity: Not Currently   Lifestyle   . Physical activity:     Days per week: 0 days     Minutes per session: 0 min   . Stress: Not at all   Relationships   . Social connections:     Talks on phone: Never     Gets together: Twice a week     Attends religious service: More than 4 times per year     Active member of club or organization: No     Attends meetings of clubs or organizations: Never     Relationship status: Divorced   . Intimate partner violence:     Fear of current or ex partner: No     Emotionally abused: No     Physically  abused: No     Forced sexual activity: No   Other Topics Concern   . None   Social History Narrative   . None       Subjective   Review of Systems:  Unable to get aphasis        Objective   Physical Exam:     Vitals: T:98.2 F (36.8 C) (Oral),  BP:136/70, HR:78, RR:(!) 26, SaO2:97%    1) General Appearance: awake In no acute distress.   2) Eyes: Pink conjunctiva, anicteric sclera. Pupils are equally reactive to light.  3) ENT: Oral mucosa moist with no pharyngeal congestion, erythema or swelling.  4) Neck: Supple, with full range of motion. Trachea is central, no JVD noted  5) Chest: Clear to auscultation bilaterally, no wheezes or rhonchi.  6) CVS: normal rate and regular rhythm, with no murmurs.  7) Abdomen: Soft, non-tender, no palpable mass. Bowel sounds normal. No CVA tenderness  8) Extremities: Contracted right upper extremity bilateral lower extremity weakness mild aphasia no pitting edema, pulses palpable, no calf swelling and no gross deformity.  9) Skin: Warm, dry with normal skin turgor, no rash  10) Lymphatics: No lymphadenopathy in axillary, cervical and inguinal area.   11) Neurological: Cranial nerves II-XII intact. No gross focal motor or sensory deficits noted.  12) Psychiatric: Affect is appropriate. No hallucinations.    EKG: Per my review shows sinus rhythm at 80 bpm      Patient Vitals for the past 12 hrs:   BP Temp Pulse Resp   12/29/17 1815 136/70 - 78 (!) 26   12/29/17 1731 122/72 - 84 (!) 34   12/29/17 1702 121/73 - 80 (!) 25   12/29/17 1658 127/80 - 81 (!) 24   12/29/17 1601 121/73 - 80 (!) 24   12/29/17 1532 122/69 - 77 (!) 39   12/29/17 1501 112/69 - 80 (!) 24   12/29/17 1458 111/64 - 78 (!) 37   12/29/17 1450 - 98.2 F (36.8 C) 80 21  Weight Monitoring 11/28/2017   Height 167.6 cm   Weight 66.679 kg   BMI (calculated) 23.8 kg/m2          Recent Results (from the past 24 hour(s))   ECG 12 lead (Specify reason for exam)    Collection Time: 12/29/17  2:48 PM   Result Value Ref Range     Patient Age 43 years    Patient DOB 14-Apr-1940     Patient Height      Patient Weight      Interpretation Text       Sinus rhythm  Atrial premature complex  Compared to ECG 11/29/2017 11:49:50  Atrial premature complex(es) now present  Ventricular premature complex(es) no longer present    Electronically Signed On 12-29-2017 15:43:59 EST by Mali Dunn      Physician Interpreter Mali Dunn     Ventricular Rate 80 //min    QRS Duration 117 ms    P-R Interval 195 ms    Q-T Interval 354 ms    Q-T Interval(Corrected) 409 ms    P Wave Axis 45 deg    QRS Axis 11 deg    T Axis 54 years   Comprehensive metabolic panel    Collection Time: 12/29/17  2:56 PM   Result Value Ref Range    Sodium 136 136 - 147 mMol/L    Potassium 5.4 (H) 3.5 - 5.3 mMol/L    Chloride 105 98 - 110 mMol/L    CO2 24 20 - 30 mMol/L    Calcium 8.5 8.5 - 10.5 mg/dL    Glucose 138 (H) 71 - 99 mg/dL    Creatinine 2.76 (H) 0.80 - 1.30 mg/dL    BUN 23 (H) 7 - 22 mg/dL    Protein, Total 6.6 6.0 - 8.3 gm/dL    Albumin 3.6 3.5 - 5.0 gm/dL    Alkaline Phosphatase 53 40 - 145 U/L    ALT 29 0 - 55 U/L    AST (SGOT) 26 10 - 42 U/L    Bilirubin, Total 0.4 0.1 - 1.2 mg/dL    Albumin/Globulin Ratio 1.20 0.80 - 2.00 Ratio    Anion Gap 12.4 7.0 - 18.0 mMol/L    BUN/Creatinine Ratio 8.3 (L) 10.0 - 30.0 Ratio    EGFR 25 (L) 60 - 150 mL/min/1.69m2    Osmolality Calculated 278 275 - 300 mOsm/kg    Globulin 3.0 2.0 - 4.0 gm/dL   Troponin I    Collection Time: 12/29/17  2:56 PM   Result Value Ref Range    Troponin I 0.01 0.00 - 0.02 ng/mL   CBC and differential    Collection Time: 12/29/17  2:56 PM   Result Value Ref Range    WBC 5.5 4.0 - 11.0 K/cmm    RBC 4.09 4.00 - 5.70 M/cmm    Hemoglobin 13.1 13.0 - 17.5 gm/dL    Hematocrit 38.8 (L) 39.0 - 52.5 %    MCV 95 80 - 100 fL    MCH 32 28 - 35 pg    MCHC 34 32 - 36 gm/dL    RDW 11.8 11.0 - 14.0 %    PLT CT 196 130 - 440 K/cmm    MPV 7.9 6.0 - 10.0 fL    NEUTROPHIL % 57.8 42.0 - 78.0 %    Lymphocytes 27.0 15.0 - 46.0 %    Monocytes  11.6 3.0 - 15.0 %    Eosinophils % 2.8 0.0 - 7.0 %  Basophils % 0.7 0.0 - 3.0 %    Neutrophils Absolute 3.2 1.7 - 8.6 K/cmm    Lymphocytes Absolute 1.5 0.6 - 5.1 K/cmm    Monocytes Absolute 0.6 0.1 - 1.7 K/cmm    Eosinophils Absolute 0.2 0.0 - 0.8 K/cmm    BASO Absolute 0.0 0.0 - 0.3 K/cmm   B-type Natriuretic Peptide    Collection Time: 12/29/17  2:56 PM   Result Value Ref Range    B-Natriuretic Peptide 10.3 0.0 - 100.0 pg/mL   D-dimer, quantitative    Collection Time: 12/29/17  2:56 PM   Result Value Ref Range    D-Dimer 1.23 (H) 0.19 - 0.52 mg/L FEU   I-Stat Troponin Notification    Collection Time: 12/29/17  5:43 PM   Result Value Ref Range    I-STAT Notification Istat Notification    i-Stat Troponin    Collection Time: 12/29/17  6:15 PM   Result Value Ref Range    Trop I, ISTAT <0.02 0.00 - 0.08 ng/mL        No Known Allergies   Mri Brain Wo Contrast Panel    Result Date: 12/29/2017  Abnormal MRI scan of the head demonstrating: 1. There are no acute intracranial lesions. 2. There are chronic focal infarctions within the right and left paramedian basis pontis. 3. There is a chronic infarction of the left corona radiata extending to the centrum semiovale. 4. There is moderate to severe cortical and subcortical atrophy. This is asymmetric and more severe involving the left compare the right hemisphere. This asymmetry can suggest a neurodegenerative process such as primary progressive aphasia. ReadingStation:[id number]    Nm Pulmonary Ventilation And Perfusion (aerosol Or Gas)    Result Date: 12/29/2017  2 small subsegmental mismatched defects are considered intermediate probability for pulmonary emboli by modified PIOPED II criteria. ReadingStation:[id number]    Xr Chest Ap Portable    Result Date: 12/29/2017  Hypoventilatory study without evidence of acute intrathoracic pathology. Benjamin Perez Medications     Med List Status:  Pharmacy Completed Set By: Jeb Levering, PharmD at  12/29/2017  8:07 PM                amLODIPine (NORVASC) 5 MG tablet     Take 1 tablet (5 mg total) by mouth daily     atropine 1 % ophthalmic solution     Place 1 drop into the right eye 4 (four) times daily     clopidogrel (PLAVIX) 75 mg tablet     Take 1 tablet (75 mg total) by mouth daily     finasteride (PROSCAR) 5 MG tablet     Take 5 mg by mouth every morning        glimepiride (AMARYL) 1 MG tablet     Take 1 mg by mouth every morning before breakfast     lisinopril-hydroCHLOROthiazide (PRINZIDE,ZESTORETIC) 20-12.5 MG per tablet     Take 1 tablet by mouth daily     Multiple Vitamins-Minerals (CENTRUM ADULTS) Tab     Take 1 tablet by mouth every morning        Multiple Vitamins-Minerals (ICAPS) Cap     Take 1 capsule by mouth every morning        Omega-3 Fatty Acids (FISH OIL) 1000 MG Cap capsule     Take 1 capsule by mouth every morning     simvastatin (ZOCOR) 40 MG tablet     Take 20 mg by mouth nightly Take 1/2 tablet (  20mg ) every night at bedtine     sodium bicarbonate 650 MG tablet     Take 650 mg by mouth 2 (two) times daily     vitamin D (CHOLECALCIFEROL) 25 MCG (1000 UT) tablet     Take 25 mcg by mouth every morning           Ongoing Comment    Stottlemyer, Randall Hiss, PharmD    12/29/2017  8:06 PM    12/29/17 - med bottles used to complete med history by Mora Bellman, CPhT. Med bottles returned to patient and left in room.            Meds given in the ED:  Medications   dextrose (D10W) 10% bolus 125 mL (has no administration in time range)   glucagon (rDNA) (GLUCAGEN) injection 1 mg (has no administration in time range)   insulin lispro (1 Unit Dial) (HumaLOG) injection pen 1-9 Units (has no administration in time range)     And   insulin lispro (1 Unit Dial) (HumaLOG) injection pen 1-7 Units (has no administration in time range)     And   insulin lispro (1 Unit Dial) (HumaLOG) injection pen 1-7 Units (has no administration in time range)   0.9%  NaCl infusion (has no administration in time range)   Tc85m  pentetate (DTPA) aerosol 30 millicurie (34.3 millicuries Inhalation Imaging Agent Given 12/29/17 1828)   Tc79m albumin aggregated (MAA) inj 5 millicurie (5.3 millicuries Intravenous Imaging Agent Given 12/29/17 1829)      Time Spent:     Princella Pellegrini, MD     12/29/17,8:39 PM   MRN: [id number]                                      CSN: [id number] DOB: 1940/01/29

## 2017-12-29 NOTE — ED Notes (Signed)
Patient bathed and bed and gown cleaned following urinary incontinence episode.

## 2017-12-30 ENCOUNTER — Observation Stay: Payer: Medicare Other

## 2017-12-30 DIAGNOSIS — I2699 Other pulmonary embolism without acute cor pulmonale: Secondary | ICD-10-CM

## 2017-12-30 LAB — BASIC METABOLIC PANEL
Anion Gap: 11.8 mMol/L (ref 7.0–18.0)
BUN / Creatinine Ratio: 9.1 Ratio — ABNORMAL LOW (ref 10.0–30.0)
BUN: 21 mg/dL (ref 7–22)
CO2: 18 mMol/L — ABNORMAL LOW (ref 20–30)
Calcium: 8.3 mg/dL — ABNORMAL LOW (ref 8.5–10.5)
Chloride: 111 mMol/L — ABNORMAL HIGH (ref 98–110)
Creatinine: 2.32 mg/dL — ABNORMAL HIGH (ref 0.80–1.30)
EGFR: 30 mL/min/{1.73_m2} — ABNORMAL LOW (ref 60–150)
Glucose: 63 mg/dL — ABNORMAL LOW (ref 71–99)
Osmolality Calculated: 273 mOsm/kg — ABNORMAL LOW (ref 275–300)
Potassium: 4.8 mMol/L (ref 3.5–5.3)
Sodium: 136 mMol/L (ref 136–147)

## 2017-12-30 LAB — CBC AND DIFFERENTIAL
Basophils %: 1.3 % (ref 0.0–3.0)
Basophils Absolute: 0.1 10*3/uL (ref 0.0–0.3)
Eosinophils %: 3.3 % (ref 0.0–7.0)
Eosinophils Absolute: 0.2 10*3/uL (ref 0.0–0.8)
Hematocrit: 39.5 % (ref 39.0–52.5)
Hemoglobin: 12.9 gm/dL — ABNORMAL LOW (ref 13.0–17.5)
Lymphocytes Absolute: 2.2 10*3/uL (ref 0.6–5.1)
Lymphocytes: 32.6 % (ref 15.0–46.0)
MCH: 32 pg (ref 28–35)
MCHC: 33 gm/dL (ref 32–36)
MCV: 97 fL (ref 80–100)
MPV: 8.5 fL (ref 6.0–10.0)
Monocytes Absolute: 0.7 10*3/uL (ref 0.1–1.7)
Monocytes: 10.2 % (ref 3.0–15.0)
Neutrophils %: 52.6 % (ref 42.0–78.0)
Neutrophils Absolute: 3.6 10*3/uL (ref 1.7–8.6)
PLT CT: 164 10*3/uL (ref 130–440)
RBC: 4.05 10*6/uL (ref 4.00–5.70)
RDW: 12 % (ref 11.0–14.0)
WBC: 6.9 10*3/uL (ref 4.0–11.0)

## 2017-12-30 LAB — VH DEXTROSE STICK GLUCOSE
Glucose POCT: 61 mg/dL — ABNORMAL LOW (ref 71–99)
Glucose POCT: 110 mg/dL — ABNORMAL HIGH (ref 71–99)
Glucose POCT: 50 mg/dL — ABNORMAL LOW (ref 71–99)
Glucose POCT: 86 mg/dL (ref 71–99)
Glucose POCT: 100 mg/dL — ABNORMAL HIGH (ref 71–99)
Glucose POCT: 63 mg/dL — ABNORMAL LOW (ref 71–99)
Glucose POCT: 91 mg/dL (ref 71–99)
Glucose POCT: 77 mg/dL (ref 71–99)

## 2017-12-30 LAB — VH APTT( HEPARIN INFUSION THERAPY)
aPTT: 129.8 s (ref 24.0–34.0)
aPTT: 50.4 s — ABNORMAL HIGH (ref 24.0–34.0)

## 2017-12-30 MED ORDER — DEXTROSE-NACL 5-0.9 % IV SOLN
INTRAVENOUS | Status: AC
Start: 2017-12-30 — End: 2017-12-30

## 2017-12-30 NOTE — Plan of Care (Signed)
bs dropped to 63 with d5ns infusing. md made aware. Re-bolused with prn order.

## 2017-12-30 NOTE — SLP Plan of Care Note (Signed)
[facility]  Department of [facility] Services      Speech Therapy General Note    Pt Name: [name] CSN: [id number]    Time: 9:49    Attempted to see pt for SLP tx.  Pt off floor to CV lab and not available at this time.  Will f/u later today when pt available.  Nurse to call this SLP when pt back in his room.    Team Communication: RN-Tanya    Plan: Will follow up later today for SLP eval.    Brandon Towry L. Bland Span., [place]   Pager [place]

## 2017-12-30 NOTE — PT Eval Note (Signed)
ATTENTION Provider(s):  Thank you for allowing [place] to participate in the care of  [facility].  Regulations from the [employer] ([place]) require your review and approval for this Plan of Care.  Please co-sign this note indicating you are in agreement with the Physical Therapy Plan of Care.    [facility]  Patient: [name]     CSN: [id number]    Bed: 335/335-A  Physical Therapy EVALUATION  Visit#: 1   Treatment Frequency: follow-up visit only  Last seen by a physical therapist vs. Physical therapist assistant: 12/30/2017    DISCHARGE RECOMMENDATIONS   Discharge Recommendations:   Home with home health PT;Home with supervision   Pending medical progress  Pending progress in next therapy session  Pending verification of the following home support and resources:  -24/7 Supervision for safety and/or physical assistance at Direct level = patient remains in caregiver's line of sight at all times.     *Discharge recommendations are subject to change based on patient's progress and/or home support changes - please refer to most recent PT note for current recommendation    DME recommended for Discharge:   Has needed equipment    PMP (Progressive Mobility Program) Recommendations:   Recommend patient  ambulate 2-3 times/day with Gait belt and Cane and physical assist and/or supervision of 1 staff as tolerated.     Precautions and Contraindications:   Falls  Mobility protocol   Patient aphasic - inconsistent with yes/no gestures.     PT Assessment and Plan of Care (Treatment frequency noted above):     HPI (per physician charting) and Pertinent Medical Details:  Admitted 12/29/2017 with/for sudden onset of confusion, drooling + SOB. VQ scan showed PE risk bilaterally and [place] of LE showed DVTs. Started on IV Heparin. 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     Goals:    By d/c:   1. Patient will perform bed mobility with  supervision assist in prep for out of bed activities. NEW   2. Patient will perform a stand pivot transfer with cane and supervision in prep for ambulation. NEW   3. Patient will ambulate 75 feet with cane and supervision assist in prep for home mobility. NEW   4. Patient will ascend and descend minimum of 1 step with 0 rails and single point cane with close supervision in order to demonstrate ability to enter home and/or curb in community.  NEW         PT Assessment:  At baseline, patient lives with his niece (per RN report), requires assistance with ADLs and ambulates with a single point cane. During evaluation, patient performed bed mobility with Min A, sit to stand and ambulation with single point cane and Min Ax2. Pt unable to use FWW due to right UE rigidity. PT recommends patient could return home if 24/7 assistance is available and return to PT - patient was at first PT session when shortness of breath and pain noted with foaming at the mouth.      Patient presenting with the following PT Impairments:decreased ROM, decreased strength, decreased activity tolerance, impaired motor control , decreased functional mobility, decreased balance, gait deficits, pain, decreased sensation    Patient will benefit from skilled PT services in order to improve above impairments to allow for maximal independence and best functional mobility outcome.       Treatment/interventions: Exercise, Gait training, Stair training, Neuromuscular re-education, Functional  transfer training, LE strengthening/ROM, Patient/caregiver training, Bed mobility    Due to the presence of a limited number of treatment options and 1-2 comorbidities or personal factors that affect performance, as well as patient's stable and/or uncomplicated characteristics, minimal to moderate modifications of mobility and/or assistance were necessary to complete evaluation when examining total of 3 elements (includes body structures and functions, activity  limitations and/or participation restriction) determines the degree of complexity for this patient is LOW    Rehabilitation Potential:Good with ongoing PT upon discharge from acute care.  24 hour supervision recommended.    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 Glaucoma, unspecified glaucoma type, unspecified laterality   . Pure hypercholesterolemia   . Essential hypertension   . Type 2 diabetes mellitus with stage 3 chronic kidney disease, without long-term current use of insulin   . History of stroke with residual deficit   . Bradycardia   . Heme positive stool   . Nonverbal   . Benign prostatic hyperplasia with urinary hesitancy   . Microalbuminuria   . Altered mental status   . Bilateral pulmonary embolism        Past Medical/Surgical History:  Past Medical History:   Diagnosis Date   . Cataracts, bilateral    . Diabetes mellitus    . Hypertension    . Kidney disorder     stage IV kidney disease   . Stroke       Past Surgical History:   Procedure Laterality Date   . EYE SURGERY     . LUNG LOBECTOMY     . right eye transplant      unsuccessful       Social History    Information per Patient, Nursing Staff:    Home Living Arrangements:  Living Arrangements: Family members, -niece   Assistance Available: Unknown  Type of Home: House  Home Layout: Unknown    Prior Level of Function:  Household ambulation  Mobility:  Modified independence  with  Single point cane  Fall history: Not known     DME  available at home:  Single point cane  Further DME unknown    Subjective   Patient shaking head yes/no inconsistently throughout session to answer questions    Patient/family/caregiver consent to therapy session is noted by the participation in the therapy session.    Patient/caregiver goal for PT: Return home to PLOF    Pain:  At Rest: 1, 2/10 per FACES scale  With Activity: 5/10 per FACES scale  Location: Leg:  right  Interventions: Repositioned, Rest    Examination of Body Systems (Structures, Function, Activity and Participation)   Patient's medical condition is appropriate for Physical therapy intervention at this time    Observation of patient:  Patient is in bed with peripheral IV     Cognition:  Oriented to: Unable to assess due to aphasia  Command following: Follows 1 step commands with increased time, Follows 1 step commands with repetition, with tactile cues and demonstration to assist  with understanding  Alertness/Arousal: Delayed responses to stimuli   Attention Span:Attends to task with redirection    Vital Signs (Cardiovascular):  on supplemental O2  Stable with no signs/symptoms of distress    Edema: none noted  Sensation: unable to assess     Balance:  Static Sitting:  Fair+  Dynamic Sitting:  Fair  Static Standing:  Fair  Dynamic Standing:  Fair-            Musculoskeletal Examination:            Range of motion:  Right LE: Grossly WFL  Left LE: Grossly WFL       Strength:  Unable to perform formal MMT but patient demonstrating at least 3/5 strength by being able to move LEs throughout range of motions and move them against gravity    Tone:  Right Upper extremity:   Hypertonic, Rigid    Functional Mobility:    Bed Mobility:  Supine to Sit:   Minimal assist.   HOB elevated, Bed rail used, assist at trunk, assist at LE(s)  Sit to Supine:   Minimal assist.   HOB flat, assist at LE(s)    Transfers:  Sit to Stand:  Minimal assist with Single point cane.         Stand to Sit:  Minimal assist.            Locomotion:  LEVEL AMBULATION:  Distance: 20'   Assistance level:  Minimal assist to Min Ax2  Device:  Single point cane  Pattern:  Step through, Narrow base of support, Decreased cadence, Decreased step length:  bilaterally, Decreased clearance:  bilaterally        Treatment Interventions this session:   Evaluation    Education Provided:   TOPICS: role of physical therapy, plan of care, goals of therapy and safety with mobility and ADLs, use of adaptive equipment     Learner educated: Patient  Method: Explanation  Response to education: Needs reinforcement and Questionable understanding    Patient Position at End of Treatment:   Supine, in bed, Needs in reach, Bed/chair alarm set and No distress    Team Communication:     Spoke to: RN/LPN - Lavella Lemons, OT -  [place] (co-evaluation)  Regarding: Pre-session re: patient status, Patient position at end of session, Patient participation with Therapy  Whiteboard updated: Yes  PT/PTA communication: via written note and verbal communication as needed.    Time of treatment:  Time Calculation  PT Received On: 12/30/17  Start Time: 5009  Stop Time: 1416  Time Calculation (min): 13 min    Zigmund Daniel, PT, DPT

## 2017-12-30 NOTE — Plan of Care (Signed)
Lab called with bs of 63, pt checked and was 50. d10 infused. Lab also called with aptt 129.8. heparin on hold for one hour and will restart at 700units.

## 2017-12-30 NOTE — Plan of Care (Signed)
Spoke with rounding md. Received new orders.

## 2017-12-30 NOTE — Progress Notes (Addendum)
NURSE NOTE SUMMARY  [place] STEP-DOWN   Patient Name: [name]   Attending Physician: Princella Pellegrini, MD   Today's date:   12/30/2017 LOS: 0 days   Shift Summary:                                                              2145: Report received. Pt expected to arrive on unit within 15 minutes.    2200: Assessment completed.     2300: IV placed. Good blood return. Initiated heparin infusion.     7471: Pt resting comfortably. APTT pending. Both IV sites are c/d/i; no swelling.   Provider Notifications:      Rapid Response Notifications:  Mobility:      PMP Activity: Step 3 - Bed Mobility (12/30/2017  1:00 AM)     Weight tracking:  Family Dynamic:   Last 3 Weights for the past 72 hrs (Last 3 readings):   Weight   12/29/17 2032 64 kg (141 lb 1.5 oz)             Recent Vitals Last Bowel Movement   BP: 122/68 (12/29/2017 10:07 PM)  Heart Rate: 64 (12/29/2017 10:07 PM)  Temp: 98.1 F (36.7 C) (12/29/2017 10:07 PM)  Resp Rate: 16 (12/29/2017 10:07 PM)  Height: 1.676 m (5\' 6" ) (12/29/2017  3:55 PM)  Weight: 64 kg (141 lb 1.5 oz) (12/29/2017  8:32 PM)  SpO2: 100 % (12/29/2017 10:07 PM)   No data recorded

## 2017-12-30 NOTE — Progress Notes (Signed)
Date Time:  12/30/17 8:30 AM Attending: Diona Browner, MD   Pt. Name:  [name]     PCP: Melvyn Neth, MD          INTERIM SUMMARY:  Pt. is a 77/M w/ PMHx of CAD, DM2, HTN, CKD 4, expressive aphasia post CVA    He presented to care d/t sudden onset of confusion, drooling  + SOB. In the ED, found to have high normal K+ (5.4) and chronic CVA w/o any acute finding. VQ scan showed PE risk bilaterally and [place] of LE showed DVTs. Started on IV Heparin.          SUBJECTIVE:  Pt. continuing on IV heparin. PT/OT ordered   Not verbalizing any concerns this AM.         Objective :  Vitals:    12/29/17 2032 12/29/17 2207 12/30/17 0318 12/30/17 0717   BP: 123/74 122/68 137/63 133/69   Pulse: 69 64 69 68   Resp: (!) 24 16 16 18    Temp:  98.1 F (36.7 C) 97.3 F (36.3 C) 97.9 F (36.6 C)   TempSrc:  Oral Oral Oral   SpO2: 100% 100% 100% 99%   Weight: 64 kg (141 lb 1.5 oz) 64 kg (141 lb 1.5 oz)     Height:  1.676 m (5' 5.98")         Intake/Output Summary (Last 24 hours) at 12/30/2017 0830  Last data filed at 12/30/2017 0718  Gross per 24 hour   Intake -   Output 640 ml   Net -640 ml     Weight Monitoring 11/28/2017 12/29/2017 12/29/2017   Height 167.6 cm - 167.6 cm   Weight 66.679 kg 64 kg 64 kg   Weight Method - Actual -   BMI (calculated) 23.8 kg/m2 - 22.8 kg/m2       Body mass index is 22.78 kg/m.        MEDS: (SCHEDULED/INFUSIONS/PRN)  Current Facility-Administered Medications   Medication Dose Route Frequency   . amLODIPine  5 mg Oral Daily   . atropine  1 drop Right Eye QID   . clopidogrel  75 mg Oral Daily   . finasteride  5 mg Oral QAM   . insulin lispro (1 Unit Dial)  1-9 Units Subcutaneous TID AC    And   . insulin lispro (1 Unit Dial)  1-7 Units Subcutaneous QHS   . simvastatin  20 mg Oral QHS   . sodium bicarbonate  650 mg Oral BID   . sodium chloride (PF)  3 mL Intravenous Q8H     Current Facility-Administered Medications   Medication Dose  Route Frequency Last Rate   . dextrose 5 % and 0.9% NaCl   Intravenous Continuous 75 mL/hr at 12/30/17 0809   . heparin infusion - MAR calculator by aPTT  0-3,500 Units/hr Intravenous Titrated Stopped (12/30/17 0748)     Current Facility-Administered Medications   Medication Dose Route   . acetaminophen  650 mg Oral    Or   . acetaminophen  650 mg per NG tube    Or   . acetaminophen  650 mg Rectal   . dextrose  125 mL Intravenous   . glucagon (rDNA)  1 mg Intramuscular   . heparin (porcine)  2,000-5,000 Units Intravenous   . insulin lispro (1 Unit Dial)  1-7 Units Subcutaneous   . naloxone  0.4 mg Intravenous            LABS:  Recent CBC:  Recent Labs   Lab 12/30/17  0529 12/29/17  2153   WBC 6.9 6.5   RBC 4.05 3.61*   Hemoglobin 12.9* 11.9*   Hematocrit 39.5 33.9*   MCV 97 94   PLT CT 164 170       Recent BMP:  Recent Labs   Lab 12/30/17  0529 12/29/17  1456   Sodium 136 136   Potassium 4.8 5.4*   Chloride 111* 105   CO2 18* 24   BUN 21 23*   Creatinine 2.32* 2.76*   Glucose 63* 138*   EGFR 30* 25*   Calcium 8.3* 8.5       Other Labs (if any):  LFTs     -      Recent Labs   Lab 12/29/17  1456   ALT 29   AST (SGOT) 26   Bilirubin, Total 0.4   Albumin 3.6   Alkaline Phosphatase 53     Cardiac -   Recent Labs   Lab 12/29/17  1456   Troponin I 0.01     PT/PTT -   Recent Labs   Lab 12/29/17  2153   PT INR 1.0          IMAGING STUDIES:  Mri Brain Wo Contrast Panel    Result Date: 12/29/2017  Abnormal MRI scan of the head demonstrating: 1. There are no acute intracranial lesions. 2. There are chronic focal infarctions within the right and left paramedian basis pontis. 3. There is a chronic infarction of the left corona radiata extending to the centrum semiovale. 4. There is moderate to severe cortical and subcortical atrophy. This is asymmetric and more severe involving the left compare the right hemisphere. This asymmetry can suggest a neurodegenerative process such as primary progressive aphasia.  ReadingStation:[id number]    Nm Pulmonary Ventilation And Perfusion (aerosol Or Gas)    Result Date: 12/29/2017  2 small subsegmental mismatched defects are considered intermediate probability for pulmonary emboli by modified PIOPED II criteria. ReadingStation:[email or web address]    Xr Chest Ap Portable    Result Date: 12/29/2017  Hypoventilatory study without evidence of acute intrathoracic pathology. ReadingStation:[id number]    US Venous Low Extrem Duplx Dopp Comp Bilat    Result Date: 12/29/2017  Impression: 1. Probable nonocclusive thrombus at the junction of the greater saphenous vein with the common femoral vein of the left leg. 2. No findings of deep venous thrombosis within the regions examined of the right leg. The findings were communicated to nurse Karrie Doffing by the sonographer at 9:20 PM on 12/29/2017. ReadingStation:[id number]         Physical Exam:  Vitals:  T:97.9 F (36.6 C) (Oral)   BP:133/69, HR:68, RR:18, SaO2:99%  General: Awake, Alert. No Acute Distress.  HEENT:  Normocephalic Atraumatic. Mucus Membranes Moist, EOMI, PERRL  Cardiovascular: RRR. Normal Heart Sounds. Brisk cap refill  Respiratory: CTAB. No rales, rhonchi or retractions.  Abdomen/GI: Nontender, nondistended. Normoactive BS  Integument: Skin intact, no bruises.  Neuro/EXT: Contracture of R. UE and bilateral LE weakness. +Expressive Aphasia w/o pitting edema. No swelling or gross deformity  Psychiatric: Cooperative. Unable to assess orientation as patient not answering questions.       ASSESSMENT AND PLAN:  # Acute Bilateral PE  # Acute Metabolic Encephalopathy  Likely responsible for SOB and confusion. VQ ocnfirmed. Cr. too high to consider angiogram.   Continuing IV Heparin  MRI ruled out acute CVA.  ?Seizure - EEG ordered and pending.  ECHO ordered and pending.  Maintain on Tele    #h/o CVA  w/ # Expressive Aphasia  Cont. asa + Plavix + statin.  SLP consulted and pending. NPO until then    # DM2  # Hypoglycemia  2 hypoglycemic epsiodes  since AM d/t NPO status. Switching to D5 fluids for a few hours till SLP can establish some sort of diet.    #Stage 4 CKD  Avoid unnecessary nephrotoxic medication  Recent renal ultrasound showed medical renal disease  Contrasted studies to be avoided as able.    #HTN  # Hyperkalemia  Improving. Continue amlodipine  WIll hold Lisinopril for today as BPs OK and K+ still high normal    #CAD  Stable. Continue home dose of  ASA + Plavix + statin        Disposition:  Continue treatment of PE  DVTPPx:  Heparin gtt.  . . anticipate eliquis switch in AM if PO available  Code:  Full Code  Expected D/C: 12/21-22    Signed by:  Diona Browner, MD  Please contact at phone number [phone] for any questions.

## 2017-12-30 NOTE — SLP Eval Note (Signed)
Thank you for allowing [place] to participate in the care of  [facility].  Regulations from the Center for Medicare and Medicaid Services ([place]) require your review and approval for this Plan of Care.  Please co-sign this note indicating you are in agreement with the Speech Language Pathology Plan of Care.      [facility]  Speech Language Pathology   Bedside Swallow Evaluation    Patient: [name]     CSN#: [id number]   Referring Physician: Princella Pellegrini, MD Date of Referral: 12/30/2017    SLP reason:  Consult received for SLP Bedside Swallow Evaluation and Treatment for "dysphagia."  Niece in room denies any significant swallowing issues in home setting.  She reports she chops his meat/food for him d/t lack of dentition and he does not use straws.    ASSESSMENT/PLAN/RECOMMENDATIONS:     SLP diagnosis:  Mild Oropharyngeal Dysphagia    Diet Recommendations:  Soft and Bite Sized diet/Thin liquids   Additional restrictions:  Consistent Carbohydrate, Cardiac per pt diagnosis/medical hx and discussion with nurse and per niece description of home diet.    Administration of Medications: No restrictions; patient may take meds per their preference/tolerance    Precautions/Compensations:  Feed only when awake/alert, Position patient upright 90 degrees for all po intake/meds, Up in chair for meals, Keep patient upright 30 minutes after meals, Alternate liquids and solids (liquid wash), Take small bites/sips, Eat/feed slowly, Aspiration precautions  Suggestions for Feeding:  1:1 supervision (next to patient to supervise each bite/sip, provide hands-on assist/cues prn, 100% of meal visualized), Patient needs to be fed  Prognosis:  Good  Aspiration Risk: cognitive deficits/confusion  Modified Barium Swallow Study recommended: no;Not indicated at this time  SLP Follow-Up Inpatient Treatment needs: f/u x1 for diet tolerance  Duration of Inpatient Treatment: 1-2 visits  Projected Discharge Recommendations:  No  further SLP needs are anticipated  Other referrals:  n/a    Goals:  Pt will:  STG:  (In 3-4 days)  1. Pt will tolerate a SOFT/BITE SIZED diet with THIN liquids free of signs/symptoms of aspiration. (NEW)  2. Pt/family/staff education (NEW)    LTG: (By discharge)  1. Pt will tolerate the least restrictive diet free of signs/symptoms of aspiration.  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 male admitted on 12/29/2017 with   Patient Active Problem List   Diagnosis   . Glaucoma, unspecified glaucoma type, unspecified laterality   . Pure hypercholesterolemia   . Essential hypertension   . Type 2 diabetes mellitus with stage 3 chronic kidney disease, without long-term current use of insulin   . History of stroke with residual deficit   . Bradycardia   . Heme positive stool   . Nonverbal   . Benign prostatic hyperplasia with urinary hesitancy   . Microalbuminuria   . Altered mental status   . Bilateral pulmonary embolism        Past Medical/Surgical History:  Past Medical History:   Diagnosis Date   . Cataracts, bilateral    . Diabetes mellitus    . Hypertension    . Kidney disorder     stage IV kidney disease   .  Stroke       Past Surgical History:   Procedure Laterality Date   . EYE SURGERY     . LUNG LOBECTOMY     . right eye transplant      unsuccessful         Baseline cognitive-communication/swallowing status:  no hx of dysphagia, Impaired cognitive-communication skills at baseline d/t dementia and aphasia    SUBJECTIVE:      Subjective: Patient is agreeable to participation in the therapy session. Family and/or guardian are agreeable to patient's participation in the therapy session. Nursing clears patient for therapy. Patient's medical condition  is appropriate for Speech therapy intervention at this time.    S: Pt nonvocal/nonverbal but able to gesture yes/no.  Pt denied difficulty swallowing by head nod "no" and none reported by niece.      OBJECTIVE:     Observation of Patient/Vital Signs:    Positioning: Patient is upright in bed.  Respiratory status: Room air  Source of nutrition at time of eval:  NPO without access  Other medical equipment in place: telemetry   O:    Oral Motor/Swallow Skills:    Dentition: Incomplete dentition  Strength:  Generalized weakness  Coordination:  WFL  ROM: Unable to reliably assess d/t pt inability to follow instructions  Symmetry:  WFL   Oral Apraxia:  N/A  Sensation:  delayed cough/throat clear response x1  Gag Reflex:  Not assessed  Laryngeal Elev:  WFL, Timely initiation of the swallow reflex, +4 of 4 fingers width laryngeal elevation palpated  Cough/Grunt:  weak reflexive cough, delayed post swallow x1 during po trials with unclear correlation to swallow; mild cough present prior and following po trials.  Motor Speech/Voice:  Pt is nonvocal/nonverbal    Consistencies presented:  ICE CHIPS: Single ice chip x1, 1/2 tsp x1  No signs/symptoms of aspiration, no change in respiratory status, no vocal change, timely initiation of the swallow reflex, prolonged/effortful mastication  THIN LIQUID: cup sip x12 , 3oz water screen (fail d/t inability to finish without stopping; pt unable to follow instructions.)  No signs/symptoms of aspiration, no change in respiratory status, no vocal change, timely initiation of the swallow reflex  PUREED: 1/2 tsp x1, 1 tsp x3   No signs/symptoms of aspiration, no change in respiratory status, no vocal change, timely initiation of the swallow reflex  MECHANICAL SOFT:  1/2 tsp x1, 1 tsp x2   No signs/symptoms of aspiration, no change in respiratory status, no vocal change, timely initiation of the swallow reflex, prolonged but functional mastication which contributes to normal occurrence of  piecemeal deglutition.  REGULAR: 1/2 tsp x1, 1 tsp x1   No signs/symptoms of aspiration, no change in respiratory status, no vocal change, timely initiation of the swallow reflex, prolonged/effortful mastication    Pt/Family/Caregiver Education Provided:     Patient, niece was/were educated re: role of slp, purpose of evaluation, sign/symptoms/risks of aspiration, compensatory swallowing strategies (Position upright 90 degrees for all po intake/meds, Up in chair for meals, Keep patient upright 30 minutes after meals, Alternate liquids and solids, Take small bites/sips, Eat/feed slowly), safe feeding strategies, results/recommendations of today's evaluation, tx goals, d/c recommendations  Good understanding was verbalized/demonstrated: yes-niece; pt with incomplete understanding but nodding appopriately  Comprehension limited by: impaired cognitive-communication skills, dementia    Team Communication:     Spoke to : RN/LPN - Tanya  Regarding: sign/symptoms/risks of aspiration, compensatory swallowing strategies (Position upright 90 degrees for all po intake/meds, Up in chair for  meals, Keep patient upright 30 minutes after meals, Alternate liquids and solids, Swallow multiple times per bite to clear, No straws, Take small bites/sips, Eat/feed slowly), safe feeding strategies, results/recommendations of today's evaluation, tx goals  Good understanding was verbalized: yes      Time of Treatment:   SLP Received On: 12/30/17  Start Time: 1233  Stop Time: 1246  Time Calculation (min): 13 min    SLP Visit Number: 1      Evaluation was completed by:  Larena Glassman L. Bland Span., [place]   Pager [place]

## 2017-12-30 NOTE — Plan of Care (Signed)
Problem: Venous Thrombotic Event  Goal: Knowledge deficit/VTE Prevention  Description  Interventions:  1. Reinforce signs/symptoms to report to physician   2. Reinforce personal safety measures while on anticoagulant therapy   3. Review medications: to include dose, frequency, drug interactions and side effects  4. Discuss medications to avoid without physician approval  5. Reinforce that patient must tell their doctor about all medications they are taking including OTC, supplements and herbal preparations  6. Discuss importance of follow up lab studies as directed by physician if discharged on anticoagulants  7. Reinforce need for patient to tell their doctor or dentist that they are taking an anticoagulant   8. Review dietary restrictions if on Warfarin as directed by dietitian  9. Self-administration of injections and tobacco cessation counseling as applicable  Outcome: Progressing  Flowsheets (Taken 12/30/2017 1445)  Knowledge deficit/ VTE Prevention: Reinforce signs/symptoms to report to physician; Reinforce personal safety measures while on anticoagulant therapy; Review medications: to include dose, frequency, drug interactions and side effects  Goal: Patient / Caregiver demonstrate/acknowledge a basic survival level of understanding on disease process, treatment plan, medications and discharge plan  Description  Interventions:  1. Patient will be in targeted therapeutic range for medications and be free from evidence of bleeding   2. Monitor for signs and symptoms of bleeding  3. Monitor for Signs of VTE (edema of calf/thigh, redness, pain)   4. Reinforce patient's/caregiver's understanding on the importance of ambulation  5. Provide patient education on medication and side effects  6. Provide anticoagulation education if indicated (e.g., heparin, Warfarin, Enoxaparin, Lepirudin)  7. Patient/caregiver demonstrates successful administration of injections prior to discharge, as appropriate  8. Consult case  management/social work for discharge needs (i.e. home health follow-up for injections and/or other needs as identified  Outcome: Progressing  Flowsheets (Taken 12/30/2017 1445)  Patient / Caregiver demonstrates/acknowledges a basic survival level of understanding on disease process, treatment plan, medications and discharge plan : Monitor for signs and symptoms of bleeding; Monitor for Signs of VTE (edema of calf/thigh, redness, pain); Provide patient education on medication and side effects; Reinforce patient's/caregiver's understanding on the importance of ambulation     Problem: Venous Thrombotic Event  Goal: Free from evidence of bleeding  Description  Interventions  1. Administer VTE prophylaxis as prescribed  2. Administer anticoagulants as prescribed  3. Initiate anticoagulation precautions.    4. Assess patient's risk for falls and implement fall prevention plan of care per policy   5. Monitor for signs/symptoms of bleeding   6. Monitor for adverse reactions to anticoagulant    7. Monitor results of ordered laboratory studies   8. Obtain nutritional consult if patient to be discharged on Warfarin for dietary teaching  9. Increase activity as tolerated/progressive mobility  Flowsheets (Taken 12/30/2017 1445)  Free from evidence of bleeding: Administer VTE prophylaxis as prescribed; Administer Anticoagulants as prescribed; Initiate anticoagulation precautions; Assess patient's risk for falls and implement fall prevention plan of care per policy; Monitor for signs and symptoms of bleeding; Monitor for adverse reactions to anticoagulant; Monitor results of ordered laboratory studies; Increase activity as tolerated/progressive mobility

## 2017-12-30 NOTE — Plan of Care (Signed)
Problem: Moderate/High Fall Risk Score >5  Goal: Patient will remain free of falls  Outcome: Progressing

## 2017-12-30 NOTE — OT Eval Note (Signed)
Thank you for allowing [place] to participate in the care of  [facility].  Regulations from the Center for Medicare and Medicaid Services ([place]) require your review and approval for this Plan of Care.  Please co-sign this note indicating you are in agreement with the Occupational Therapy Plan of Care.    [facility]: [facility]  Department of Rehabilitation Services: [phone]    MATTHE SLOANE    CSN#: [id number]  SURG TELEMETRY STEP-DOWN [id number]    Occupational Therapy Evaluation    Consult received for [name] for OT Evaluation and Treatment.  Patient's medical condition is appropriate for Occupational therapy intervention at this time.    Time of treatment:   Time Calculation  OT Received On: 12/30/17  Start Time: 1404  Stop Time: 1416  Time Calculation (min): 12 min    Visit#: 1    Precautions and Contraindications:   Falls  Mobility protocol   Patient aphasic - inconsistent yes/no gestures.     OT Assessment/Clinical Decision Making:      [name] is a 77 y.o. male admitted 12/29/2017 presenting with sudden onset of confusion, drooling  + SOB. VQ scan showed PE risk bilaterally and [place] of LE showed DVTs. Started on IV Heparin.    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     At this time, patient with intermittent yes/no responses reliably. Patient reports that he needs assistance with getting dressed and uses SPT cane to walk. Per RN report, patient lives with niece (not present at time of evaluation).     Patient displaying the following impairments: decreased ROM, decreased strength, balance deficits, decreased problem solving, decreased cognition, decreased activity tolerance, decrease safety awareness and fall risk. These impairments are affecting the patient's ability to perform in needed/desired occupations resulting in performance deficits in bathing/showering, toileting & toilet hygiene,  dressing, functional mobility/transfers, personal hygiene & grooming and driving and community mobility. Would benefit from continued occupational therapy services for above noted impairments.     Rehabilitation Potential: Good With family Ongoing OT assessment needed     Risks/benefits/POC discussed: Patient    Plan:   Treatment/interventions: ADL retraining, functional transfer training, activity pacing, cognition (safety awareness, problem solving, etc.), patient/family training, equipment eval/education, compensatory technique education, continued evaluation    Treatment Frequency: OT Frequency Recommended: follow-up visit only    Goals:   In 3 to 4 visits:  1. Patient will donn/doff shirt/gown with Moderate assist and use of AT/AE prn. NEW  2. Patient will perform BSC/chair transfer with Minimal assist and use of AD/DME prn. NEW  3. Patient will perform toilet transfer with Minimal assist and use of AD/DME prn. NEW  4. Patient will perform 2 grooming tasks with Minimal assist while standing at sink and use of AE/AT prn. NEW    LTG (by d/c):   1. Patient will be Minimal assist for ADLs, Supervision for functional transfers/mobility with use of AD/DME/AT/AE. 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 male admitted on 12/29/2017 with sudden onset of confusion, drooling  + SOB. VQ scan showed PE risk bilaterally and [place] of LE showed DVTs. Started on IV  Heparin.    X-Rays/Tests/Labs:  Mri Brain Wo Contrast Panel    Result Date: 12/29/2017  Abnormal MRI scan of the head demonstrating: 1. There are no acute intracranial lesions. 2. There are chronic focal infarctions within the right and left paramedian basis pontis. 3. There is a chronic infarction of the left corona radiata extending to the centrum semiovale. 4. There is moderate to severe cortical and subcortical atrophy. This is asymmetric and more severe involving the left compare the right hemisphere. This asymmetry can suggest a neurodegenerative process such as primary progressive aphasia. ReadingStation:[id number]    Nm Pulmonary Ventilation And Perfusion (aerosol Or Gas)    Result Date: 12/29/2017  2 small subsegmental mismatched defects are considered intermediate probability for pulmonary emboli by modified PIOPED II criteria. ReadingStation:[id number]    Xr Chest Ap Portable    Result Date: 12/29/2017  Hypoventilatory study without evidence of acute intrathoracic pathology. ReadingStation:[id number]    US Venous Low Extrem Duplx Dopp Comp Bilat    Result Date: 12/29/2017  Impression: 1. Probable nonocclusive thrombus at the junction of the greater saphenous vein with the common femoral vein of the left leg. 2. No findings of deep venous thrombosis within the regions examined of the right leg. The findings were communicated to nurse Karrie Doffing by the sonographer at 9:20 PM on 12/29/2017. ReadingStation:[id number]    Discussed with patient/family/caregiver the patient's physical, cognitive and/or psychosocial history related to current functional performance: yes    Previous therapy services: Unknown    Patient Active Problem List   Diagnosis   . Glaucoma, unspecified glaucoma type, unspecified laterality   . Pure hypercholesterolemia   . Essential hypertension   . Type 2 diabetes mellitus with stage 3 chronic kidney disease, without long-term current use of insulin   . History of stroke with residual  deficit   . Bradycardia   . Heme positive stool   . Nonverbal   . Benign prostatic hyperplasia with urinary hesitancy   . Microalbuminuria   . Altered mental status   . Bilateral pulmonary embolism        Past Medical/Surgical History:  Past Medical History:   Diagnosis Date   . Cataracts, bilateral    . Diabetes mellitus    . Hypertension    . Kidney disorder     stage IV kidney disease   . Stroke       Past Surgical History:   Procedure Laterality Date   . EYE SURGERY     . LUNG LOBECTOMY     . right eye transplant      unsuccessful         Occupational Profile:   Home Living Arrangements  Living Arrangements: Neice (per RN report)  Assistance Available: Unknown  Type of Home: House  Home Layout: Unknown  Bathroom: Unknown  DME Currently at Home: Single point cane    Prior Level of Function  Mobility: Household ambulation, with SPT cane - unknown if he needs person assistance  Fall History: unknown     Activities of Daily Living  Patient shaking head "yes" to needing help with dressing    Instrumental Activities of Daily Living  Patient was dependent for all IADLs at baseline.    Subjective:   Patient able to gesture yes/no with intermittent reliability  Patient is agreeable to participation in the therapy session. Nursing clears patient for therapy.    Pain:  Reports pain in right LE.     ASSESSMENT OF OCCUPATIONAL PERFORMANCE:   Observation of Patient:    Patient is in bed with peripheral IV            Vital Signs:   Stable with no signs/symptoms of distress     Oriented to: Unable to assess due to aphasic  Command following: Follows 1 step commands with increased time, Follows 1 step commands with repetition, with tactile cues and visual cues  Alertness/Arousal: Delayed responses to stimuli   Attention Span:Attends to task with redirection  Memory: UTA=Unable to assess  Safety Awareness: UTA=unable to assess  Insights: Educated in Surveyor, mining  Problem Solving: UTA=unable to assess    Behavior:  Cooperative    Musculoskeletal Examination:   Range of motion:  Right UE contracted from previous stroke  Left UE: Grossly WFL        Strength:  Right UE not tested secondary to contracture and left UE WFL    Tone: Right Upper extremity:   Rigid  Sensory/Oculomotor Examination:   Auditory:  impaired:  bilaterally  Tactile-Light Touch:  unable to assess reliably  Visual Acuity:  unable to assess reliably           Activities of Daily Living:   Eating:  Supervision/Set up; , in bed; , beverage management  Grooming: Moderate assist, Simulated, seated at edge of bed  UB dressing:  Maximal assist, Simulated, seated at edge of bed  LB dressing: Total assist: unable to perform task due to current impairments  Bathing:  Total assist: unable to perform task due to current impairments;  Toileting:  Total assist: unable to perform task due to current impairments;         Functional Mobility:   Bed Mobility:  Supine to Sit:   Minimal assist.   HOB elevated, Bed rail used  Sit to Supine:   Minimal assist.        Seated Scooting:   Supervision    Transfers:  Sit to Stand:  Minimal assist (assist x2 for safety) with Single point cane.        Stand to Sit:  Minimal assist (assist x2 for safety).        Functional mobility/ambulation: Minimal assist (assist x2 for safety) with Single point cane.      Balance:   Static Sitting:  Fair+  Dynamic Sitting:  Fair+  Static Standing:  Fair  Dynamic Standing:  Fair-    Participation and Activity Tolerance   Participation effort: Good  Activity Tolerance: Tolerates 10-20 minutes of activity with multiple rests    Other Treatment Interventions:   Treatment Activities:   Not applicable          Education Provided:   Topics: Role of occupational therapy, plan of care, goals of therapy and safety with mobility and ADLs, benefits of activity, home safety, bed mobility with use of adaptive equipment or strategy, activity with nursing.    Individuals educated: Patient.  Method:  Explanation.  Response to education: Questionable understanding.    Team Communication:   OT communicated with: RN/LPN - Lavella Lemons, PT - Lyndsay   Co-evaluation with PT  OT communicated regarding: Pre-session re: patient status, Patient position at end of session, Discharge  needs, Patient participation with Therapy, Vital signs, Further recommendations  OT/COTA communication: via written note and verbal communication as needed.    Supine, in bed, in the room, Needs in reach, Bed/chair alarm set, No distress and Affected Extremity elevated    Recommend client be assisted to chair for all meals outside of and in addition to OT session.    [place] Katrianna Friesenhahn, OTR/L

## 2017-12-30 NOTE — UM Notes (Signed)
[facility] Utilization Management Review Sheet    Facility :  [facility]    NAME: [name]  MR#: [id number]    CSN#: [id number]     DOB: March 13, 1940     ROOM: 335/335-A AGE: 77 y.o.    ADMIT DATE AND TIME: 12/29/2017  2:45 PM      PATIENT CLASS: Observation 12/29/2017 @ 2028     ATTENDING PHYSICIAN: Diona Browner, MD  PAYOR:Payor: MEDICARE / Plan: MEDICARE PART A AND B / Product Type: Medicare /       AUTH #:     DIAGNOSIS:     ICD-10-CM    1. Bilateral pulmonary embolism I26.99    2. Acute hyperkalemia E87.5    3. Peritoneal dialysis status Z99.2    4. 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 Cataracts, bilateral    . Diabetes mellitus    . Hypertension    . Kidney disorder     stage IV kidney disease   . Stroke      [id number]     12/29/2017    "CC: Altered mental status  [name] is a 77 y.o. male patient   Medical history of old stroke, CAD, diabetes mellitus, hypertension,stage 4 CKD that is come to ED with above-mentioned complaints.  Physical therapy was started earlier today at that time while patient was walking suddenly became confused drooling of saliva with shortness of breathing holding his head and chest.  Patient has baseline nonverbal unable to get detailed history from the patient.  Denies any bowel bladder incontinence or tongue bite.  Denies any abdominal pain nausea vomiting diarrhea.  In ED patient remained hemodynamically stable potassium 5.4 troponin x2- BNP normal chest x-ray was unremarkable MRI brain negative for acute stroke.  VQ scan intermediate probability d-dimer 1.2" Per Dr. Corbin Ade H&P    LABS: Hct 38.8/33.9 Hgb 11.9 Glucose 138 BUN 23 Creatinine 2.76 Potassium 5.4 EGFR 25 D Dimer 1.23 aPTT 23.1     XR Chest AP Portable:  Hypoventilatory study without evidence of acute intrathoracic pathology.    MRI Brain WO Contrast Panel:    Abnormal MRI scan of the head  demonstrating:  1. There are no acute intracranial lesions.  2. There are chronic focal infarctions within the right and left paramedian basis pontis.  3. There is a chronic infarction of the left corona radiata extending to the centrum semiovale.  4. There is moderate to severe cortical and subcortical atrophy. This is asymmetric and more severe involving the left compare the right hemisphere. This asymmetry can suggest a neurodegenerative process such as primary progressive aphasia.    NM Pulmonary Ventilation And Perfusion:  2 small subsegmental mismatched defects are considered intermediate probability for pulmonary emboli by modified PIOPED II criteria.    US Venous Low Extrem Duplx Dopp Comp Bilat:  1. Probable nonocclusive thrombus at the junction of the greater saphenous vein with the common femoral vein of the left leg.  2. No findings of deep venous thrombosis within the regions examined of the right leg.    VITALS: T98.2 P80 R21 BP111/64 Sat 99%    ASSESSMENT AND PLAN:  1.  Transient altered mental status with drooling with shortness of breathing  Elevated d-dimer MRI brain revealed old stroke  Could be seizure/related to underlying PE/cardiac arrhythmia  For seizure seizure precaution ordered EEG ordered  VQ scan intermediate probability we will continue IV heparin  Follow-up venous Doppler  Follow-up transthoracic echocardiogram  tele monitor avoid beta-blocker patient had adverse reaction to atenolol in the past with heart block.  N.p.o. speech and swallow evaluation to rule out aspiration  consult neurology in a.m.  2.  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  Hypertension potassium was 5.4 I will hold lisinopril for now  We will continue amlodipine  PRN hydralazine if elevated blood pressure.  5. Diabetes  mellitus hold oral hypoglycemics  Add sliding scale insulin for now  6.  Single-vessel CAD continue aspirin statin Plavix  DVT PPx: on iv heparin   Dispo: floor  Healthcare Proxy: niece  Code: full code  Addendum created at around 6 58 pm ultrasound lower extremity showed high suspicion of left leg DVT patient is on IV heparin.     Admit to Surg Tele SLP/PT/OT eval and treat fall precautions Seizure precautions Vitals q 8 hrs telemetry monitoring skin assessment daily pain assessment q shift routine oral care SCD q shift encourage fluids CCD diet     MEDICATIONS: Heparin 5000units IV x 1 Zocor 20mg  PO @ HS Sodium Bicarbonate 650mg  PO 2 times daily NS@75ml /hr IV Heparin @ 52ml/hr     DATE OF REVIEW: 12/30/2017    VITALS: BP 142/71   Pulse (!) 58   Temp 97.5 F (36.4 C) (Oral)   Resp 18   Ht 1.676 m (5' 5.98")   Wt 64 kg (141 lb 1.5 oz)   SpO2 99%   BMI 22.78 kg/m     Active Hospital Problems    Diagnosis   . Bilateral pulmonary embolism   . Altered mental status     LABS: Hgb 12.9 Glucose 63 Creatinine 2.32 Chloride 111 Carbon Dioxide 18 Calcium 8.3 EGFR 30 Osmolality Calculated 273 aPTT 129.8    ECHO:   Conclusions        1: Normal biventricular size and function. The left ventricular ejection fraction is normal, estimated at 60-                       65%. There are no regional wall motion abnormalities present. There is grade I diastolic dysfunction of the                        left ventricle (impaired relaxation pattern).                       2: The aortic valve is trileaflet with mildly thickened/sclerotic leaflets.,  No stenosis. The aortic valve                        regurgitation is mild.                       3: The mitral valve leaflets are mildly thickened, without stenosis.  The mitral valve regurgitation is trace.                       4: No significant evidence for pulmonary pretension.     ASSESSMENT AND PLAN:  ASSESSMENT AND PLAN:  # Acute Bilateral PE  #  Acute Metabolic Encephalopathy   Likely responsible for SOB and confusion. VQ ocnfirmed. Cr. too high to consider angiogram.   Continuing IV Heparin  MRI ruled out acute CVA.  ?Seizure - EEG ordered and pending.  ECHO ordered and pending.  Maintain on Tele    #h/o CVA  w/ # Expressive Aphasia  Cont. asa + Plavix + statin.  SLP consulted and pending. NPO until then    # DM2  # Hypoglycemia  2 hypoglycemic epsiodes since AM d/t NPO status. Switching to D5 fluids for a few hours till SLP can establish some sort of diet.    #Stage 4 CKD  Avoid unnecessary nephrotoxic medication  Recent renal ultrasound showed medical renal disease  Contrasted studies to be avoided as able.    #HTN  # Hyperkalemia  Improving. Continue amlodipine  WIll hold Lisinopril for today as BPs OK and K+ still high normal    #CAD  Stable. Continue home dose of  ASA + Plavix + statin        Disposition:     Continue treatment of PE  DVTPPx:         Heparin gtt.  . . anticipate eliquis switch in AM if PO available  Code:              Full Code  Expected D/C: 12/21-22    MEDICATIONS:   Scheduled Meds:  Current Facility-Administered Medications   Medication Dose Route Frequency   . amLODIPine  5 mg Oral Daily   . atropine  1 drop Right Eye QID   . clopidogrel  75 mg Oral Daily   . finasteride  5 mg Oral QAM   . insulin lispro (1 Unit Dial)  1-9 Units Subcutaneous TID AC    And   . insulin lispro (1 Unit Dial)  1-7 Units Subcutaneous QHS   . simvastatin  20 mg Oral QHS   . sodium bicarbonate  650 mg Oral BID   . sodium chloride (PF)  3 mL Intravenous Q8H     Continuous Infusions:  . dextrose 5 % and 0.9% NaCl 75 mL/hr at 12/30/17 0809   . heparin infusion - MAR calculator by aPTT 700 Units/hr (12/30/17 1059)     PRN Meds:.acetaminophen **OR** acetaminophen **OR** acetaminophen, dextrose, glucagon (rDNA), heparin (porcine), NSG Communication: Glucose POCT order (AC, HS) **AND** insulin lispro (1 Unit Dial) **AND** insulin lispro (1 Unit Dial) **AND** insulin lispro (1 Unit  Dial), naloxone    Raeley Gilmore L. [facility]  Utilization Review  [address]  [place] [address]  [phone]  Fax [phone]

## 2017-12-30 NOTE — Progress Notes (Signed)
NURSE NOTE SUMMARY  [place] STEP-DOWN   Patient Name: [name]   Attending Physician: Diona Browner, MD   Today's date:   12/30/2017 LOS: 0 days   Shift Summary:                                                              7209 Received report from previous RN, pt ambulated to bathroom with assist x2 for BM. Pt back in bed, bed alarm set. No s/s distress, IV sites infusing, C/D/I. CBIR, monitoring.    Provider Notifications:      Rapid Response Notifications:  Mobility:      PMP Activity: Step 6 - Walks in Room (12/30/2017  2:03 PM)     Weight tracking:  Family Dynamic:   Last 3 Weights for the past 72 hrs (Last 3 readings):   Weight   12/29/17 2207 64 kg (141 lb 1.5 oz)   12/29/17 2032 64 kg (141 lb 1.5 oz)             Recent Vitals Last Bowel Movement   BP: 133/69 (12/30/2017  7:56 PM)  Heart Rate: 68 (12/30/2017  7:56 PM)  Temp: 98.8 F (37.1 C) (12/30/2017  7:56 PM)  Resp Rate: 16 (12/30/2017  7:56 PM)  Height: 1.676 m (5' 5.98") (12/29/2017 10:07 PM)  Weight: 64 kg (141 lb 1.5 oz) (12/29/2017 10:07 PM)  SpO2: 99 % (12/30/2017  7:56 PM)   No data recorded

## 2017-12-31 LAB — VH DEXTROSE STICK GLUCOSE
Glucose POCT: 104 mg/dL — ABNORMAL HIGH (ref 71–99)
Glucose POCT: 106 mg/dL — ABNORMAL HIGH (ref 71–99)
Glucose POCT: 62 mg/dL — ABNORMAL LOW (ref 71–99)
Glucose POCT: 66 mg/dL — ABNORMAL LOW (ref 71–99)
Glucose POCT: 68 mg/dL — ABNORMAL LOW (ref 71–99)
Glucose POCT: 81 mg/dL (ref 71–99)
Glucose POCT: 83 mg/dL (ref 71–99)
Glucose POCT: 95 mg/dL (ref 71–99)
Glucose POCT: 96 mg/dL (ref 71–99)

## 2017-12-31 LAB — CBC
Hematocrit: 39.1 % (ref 39.0–52.5)
Hemoglobin: 12.4 gm/dL — ABNORMAL LOW (ref 13.0–17.5)
MCH: 30 pg (ref 28–35)
MCHC: 32 gm/dL (ref 32–36)
MCV: 94 fL (ref 80–100)
MPV: 8.1 fL (ref 6.0–10.0)
PLT CT: 188 10*3/uL (ref 130–440)
RBC: 4.14 10*6/uL (ref 4.00–5.70)
RDW: 11.7 % (ref 11.0–14.0)
WBC: 6.4 10*3/uL (ref 4.0–11.0)

## 2017-12-31 LAB — BASIC METABOLIC PANEL
Anion Gap: 11.8 mMol/L (ref 7.0–18.0)
BUN / Creatinine Ratio: 8.1 Ratio — ABNORMAL LOW (ref 10.0–30.0)
BUN: 18 mg/dL (ref 7–22)
CO2: 24 mMol/L (ref 20–30)
Calcium: 8.7 mg/dL (ref 8.5–10.5)
Chloride: 104 mMol/L (ref 98–110)
Creatinine: 2.22 mg/dL — ABNORMAL HIGH (ref 0.80–1.30)
EGFR: 32 mL/min/{1.73_m2} — ABNORMAL LOW (ref 60–150)
Glucose: 104 mg/dL — ABNORMAL HIGH (ref 71–99)
Osmolality Calculated: 272 mOsm/kg — ABNORMAL LOW (ref 275–300)
Potassium: 4.8 mMol/L (ref 3.5–5.3)
Sodium: 135 mMol/L — ABNORMAL LOW (ref 136–147)

## 2017-12-31 LAB — VH APTT( HEPARIN INFUSION THERAPY)
aPTT: 54.4 s — ABNORMAL HIGH (ref 24.0–34.0)
aPTT: 34.8 s — ABNORMAL HIGH (ref 24.0–34.0)

## 2017-12-31 MED ORDER — APIXABAN 5 MG PO TABS
10.0000 mg | ORAL_TABLET | Freq: Two times a day (BID) | ORAL | Status: DC
Start: 2017-12-31 — End: 2018-01-01
  Administered 2017-12-31 – 2018-01-01 (×2): 10 mg via ORAL
  Filled 2017-12-31 (×4): qty 2

## 2017-12-31 MED ORDER — APIXABAN 5 MG PO TABS
5.0000 mg | ORAL_TABLET | Freq: Two times a day (BID) | ORAL | Status: DC
Start: 2018-01-07 — End: 2018-01-01

## 2017-12-31 NOTE — SLP Progress Note (Signed)
[facility]  Speech Language Pathology   Daily Progress Note    Patient: [name]    CSN#: [id number]    ASSESSMENT/PLAN/RECOMMENDATIONS:     SLP diagnosis:  Functional oropharyngeal swallow    Diet recommendations:  Soft and Bite Sized diet, Thin liquids, No restrictions; patient may take meds per their preference/tolerance    Projected Discharge Recommendations:  No further SLP needs    Prognosis:  Good     Continue therapy for: D/C; No further SLP needs at this time    Discontinue therapy        Goals:  Pt will:  STG:  (In 3-4 days)  1. Pt will tolerate a SOFT/BITE SIZED diet with THIN liquids free of signs/symptoms of aspiration. (MET)  2. Pt/family/staff education (MET)    LTG: (By discharge)  1. Pt will tolerate the least restrictive diet free of signs/symptoms of aspiration.  (MET)    SUBJECTIVE:      Subjective: Patient is agreeable to participation in the therapy session. Nursing clears patient for therapy. Patient's medical condition is appropriate for Speech therapy intervention at this time.    S:  Pt is nonverbal but nods yes/no to simple questions.      OBJECTIVE:     O: SLP Treatment:  Pt was seen for SLP tx with progress as follows:  Pt tolerating soft and bite size diet per RN. Pt given trials of peaches and consumed without difficulty. I do not recommend upgrading diet as pt has no dentition. No further SLP services warranted at this time.       Pt/Family/Caregiver Education Provided:     Patient was/were educated re: results/recommendations  Good understanding was verbalized/demonstrated: yes  Comprehension limited by: None    Team Communication:     Spoke to : RN/LPN - Insurance underwriter  Regarding: results/recommendations of today's evaluation, d/c recommendations  Good understanding was verbalized: yes                          Time of treatment:   SLP Received On: 12/31/17  Start Time: 1157  Stop Time: 1207  Time Calculation (min): 10 min           Completed by:  Mechele Dawley, M.S.,  Bessie

## 2017-12-31 NOTE — Progress Notes (Signed)
Assumed care of pt. Agree with previous RN assessment. Hep gtt running at appropriate rate. No needs at this time. Pt resting comfortably. Bed alarm set. Will monitor.

## 2017-12-31 NOTE — Progress Notes (Signed)
NURSE NOTE SUMMARY  [place] STEP-DOWN   Patient Name: [name]   Attending Physician: Diona Browner, MD   Today's date:   12/31/2017 LOS: 0 days   Shift Summary:                                                              1900: Received report. Assumed care. Pt resting comfortably.    2100: Assessment completed. Medications administered according to [facility].    Provider Notifications:      Rapid Response Notifications:  Mobility:      PMP Activity: Step 3 - Bed Mobility (12/31/2017  9:00 PM)     Weight tracking:  Family Dynamic:   Last 3 Weights for the past 72 hrs (Last 3 readings):   Weight   12/31/17 0402 63.2 kg (139 lb 5.3 oz)   12/29/17 2207 64 kg (141 lb 1.5 oz)   12/29/17 2032 64 kg (141 lb 1.5 oz)             Recent Vitals Last Bowel Movement   BP: 129/68 (12/31/2017  7:52 PM)  Heart Rate: 84 (12/31/2017  7:52 PM)  Temp: 98.4 F (36.9 C) (12/31/2017  7:52 PM)  Resp Rate: 16 (12/31/2017  7:52 PM)  Weight: 63.2 kg (139 lb 5.3 oz) (12/31/2017  4:02 AM)  SpO2: 97 % (12/31/2017  7:52 PM)   Last BM Date: 12/31/17

## 2017-12-31 NOTE — Plan of Care (Signed)
NURSE NOTE SUMMARY  [place] STEP-DOWN   Patient Name: [name]   Attending Physician: Diona Browner, MD   Today's date:   12/31/2017 LOS: 0 days   Shift Summary:                                                              0800-received report and resumed care of patient. No distress observed. Hep gtt infusing w/out incident. Patient denies pain at this time. Will continue to monitor.     1040-patient's blood sugar in 60s, despite eating all of breakfast tray. Patient asymptomatic-patient does not want another snack at this time. Will give prn dextrose per order. Notified Dr Alanda Amass and charge nurse.    1120-blood sugar rechecked fro 96. Notified Dr Alanda Amass.    1520-Report given to oncoming nurse   Provider Notifications:      Rapid Response Notifications:  Mobility:      PMP Activity: Step 6 - Walks in Room (12/31/2017 11:00 AM)     Weight tracking:  Family Dynamic:   Last 3 Weights for the past 72 hrs (Last 3 readings):   Weight   12/31/17 0402 63.2 kg (139 lb 5.3 oz)   12/29/17 2207 64 kg (141 lb 1.5 oz)   12/29/17 2032 64 kg (141 lb 1.5 oz)             Recent Vitals Last Bowel Movement   BP: 137/75 (12/31/2017  4:18 PM)  Heart Rate: 77 (12/31/2017  4:18 PM)  Temp: 99 F (37.2 C) (12/31/2017  4:18 PM)  Resp Rate: 17 (12/31/2017  4:18 PM)  Weight: 63.2 kg (139 lb 5.3 oz) (12/31/2017  4:02 AM)  SpO2: 97 % (12/31/2017  4:18 PM)   Last BM Date: 12/31/17

## 2017-12-31 NOTE — Plan of Care (Signed)
Problem: Moderate/High Fall Risk Score >5  Goal: Patient will remain free of falls  Outcome: Progressing  Flowsheets (Taken 12/31/2017 2157)  VH Moderate Risk (6-13): INITIATE YELLOW "FALL RISK" SIGNAGE; USE OF BED EXIT ALARM IF PATIENT IS CONFUSED OR IMPULSIVE. PLACE RESET BED ALARM SIGN ABOVE BED

## 2017-12-31 NOTE — Progress Notes (Signed)
Date Time:  12/31/17 6:19 PM Attending: Diona Browner, MD   Pt. Name:  [name]     PCP: Melvyn Neth, MD          INTERIM SUMMARY:  Pt. is a 77/M w/ PMHx of CAD, DM2, HTN, CKD 4, expressive aphasia post CVA    He presented to care d/t sudden onset of confusion, drooling  + SOB. In the ED, found to have high normal K+ (5.4) and chronic CVA w/o any acute finding. VQ scan showed PE risk bilaterally and [place] of LE showed DVTs. Started on IV Heparin doing well./ EEG assessed and w/o seizure. Transitioned to PO eliquis.          SUBJECTIVE:  Pt. comfortable this AM. Transitioning to eliquis.  No issues or concern this AM.       Objective :  Vitals:    12/31/17 0839 12/31/17 1015 12/31/17 1232 12/31/17 1618   BP: 119/65 134/69 142/74 137/75   Pulse: (!) 54  60 77   Resp: 18  17 17    Temp: 97.7 F (36.5 C)  97.9 F (36.6 C) 99 F (37.2 C)   TempSrc: Oral  Oral Oral   SpO2: 99%  99% 97%   Weight:       Height:           Intake/Output Summary (Last 24 hours) at 12/31/2017 1819  Last data filed at 12/31/2017 1618  Gross per 24 hour   Intake 680 ml   Output 1325 ml   Net -645 ml     Weight Monitoring 12/29/2017 12/29/2017 12/31/2017   Height - 167.6 cm -   Weight 64 kg 64 kg 63.2 kg   Weight Method Actual - Standing Scale   BMI (calculated) - 22.8 kg/m2 -       Body mass index is 22.5 kg/m.        MEDS: (SCHEDULED/INFUSIONS/PRN)  Current Facility-Administered Medications   Medication Dose Route Frequency   . amLODIPine  5 mg Oral Daily   . apixaban  10 mg Oral Q12H [place]    Followed by   . [START ON 01/07/2018] apixaban  5 mg Oral Q12H SCH   . atropine  1 drop Right Eye QID   . clopidogrel  75 mg Oral Daily   . finasteride  5 mg Oral QAM   . insulin lispro (1 Unit Dial)  1-9 Units Subcutaneous TID AC    And   . insulin lispro (1 Unit Dial)  1-7 Units Subcutaneous QHS   . simvastatin  20 mg Oral QHS   . sodium bicarbonate  650 mg Oral BID   . sodium  chloride (PF)  3 mL Intravenous Q8H     Current Facility-Administered Medications   Medication Dose Route Frequency Last Rate     Current Facility-Administered Medications   Medication Dose Route   . acetaminophen  650 mg Oral    Or   . acetaminophen  650 mg per NG tube    Or   . acetaminophen  650 mg Rectal   . dextrose  125 mL Intravenous   . glucagon (rDNA)  1 mg Intramuscular   . insulin lispro (1 Unit Dial)  1-7 Units Subcutaneous   . naloxone  0.4 mg Intravenous            LABS:    Recent CBC:  Recent Labs   Lab 12/30/17  0529 12/29/17  2153   WBC 6.9 6.5  RBC 4.05 3.61*   Hemoglobin 12.9* 11.9*   Hematocrit 39.5 33.9*   MCV 97 94   PLT CT 164 170       Recent BMP:  Recent Labs   Lab 12/30/17  0529 12/29/17  1456   Sodium 136 136   Potassium 4.8 5.4*   Chloride 111* 105   CO2 18* 24   BUN 21 23*   Creatinine 2.32* 2.76*   Glucose 63* 138*   EGFR 30* 25*   Calcium 8.3* 8.5       Other Labs (if any):  LFTs     -      Recent Labs   Lab 12/29/17  1456   ALT 29   AST (SGOT) 26   Bilirubin, Total 0.4   Albumin 3.6   Alkaline Phosphatase 53     Cardiac -   Recent Labs   Lab 12/29/17  1456   Troponin I 0.01     PT/PTT -   Recent Labs   Lab 12/29/17  2153   PT INR 1.0          IMAGING STUDIES:  Mri Brain Wo Contrast Panel    Result Date: 12/29/2017  Abnormal MRI scan of the head demonstrating: 1. There are no acute intracranial lesions. 2. There are chronic focal infarctions within the right and left paramedian basis pontis. 3. There is a chronic infarction of the left corona radiata extending to the centrum semiovale. 4. There is moderate to severe cortical and subcortical atrophy. This is asymmetric and more severe involving the left compare the right hemisphere. This asymmetry can suggest a neurodegenerative process such as primary progressive aphasia. ReadingStation:[id number]    Nm Pulmonary Ventilation And Perfusion (aerosol Or Gas)    Result Date: 12/29/2017  2 small subsegmental mismatched defects are  considered intermediate probability for pulmonary emboli by modified PIOPED II criteria. ReadingStation:[email or web address]    Xr Chest Ap Portable    Result Date: 12/29/2017  Hypoventilatory study without evidence of acute intrathoracic pathology. ReadingStation:[id number]    US Venous Low Extrem Duplx Dopp Comp Bilat    Result Date: 12/29/2017  Impression: 1. Probable nonocclusive thrombus at the junction of the greater saphenous vein with the common femoral vein of the left leg. 2. No findings of deep venous thrombosis within the regions examined of the right leg. The findings were communicated to nurse Karrie Doffing by the sonographer at 9:20 PM on 12/29/2017. ReadingStation:[email or web address]         Physical Exam:  Vitals:  T:99 F (37.2 C) (Oral)   BP:137/75, HR:77, RR:17, SaO2:97%  General: Awake, Alert. No Acute Distress.  HEENT:  Normocephalic Atraumatic. Mucus Membranes Moist, EOMI, PERRL  Cardiovascular: RRR. Normal Heart Sounds. Brisk cap refill  Respiratory: CTAB. No rales, rhonchi or retractions.  Abdomen/GI: Nontender, nondistended. Normoactive BS  Integument: Skin intact, no bruises.  Neuro/EXT: Contracture of R. UE and bilateral LE weakness. +Expressive Aphasia w/o pitting edema. No swelling or gross deformity  Psychiatric: Cooperative. Unable to assess orientation as patient not answering questions.       ASSESSMENT AND PLAN:  # Acute Bilateral PE  # Acute Metabolic Encephalopathy  Likely responsible for SOB and confusion. V/Q confirmed. Cr. too high to consider angiogram.   Continuing IV Heparin  MRI ruled out acute CVA.  ?Seizure - EEG ordered and pending.  ECHO ordered and pending.  Maintain on Tele    #h/o CVA  w/ # Expressive Aphasia  Cont. asa + Plavix + statin.  SLP consulted - states pt. is good for diet advanced.  EEG shows no seizure.  MRI brain w/ chronic infarctions best explanation.    # DM2  # Hypoglycemia  2 hypoglycemic epsiodes since AM d/t NPO status. Switching to D5 fluids for a few hours till SLP can  establish some sort of diet.    #Stage 4 CKD  Avoid unnecessary nephrotoxic medication  Recent renal ultrasound showed medical renal disease  Contrasted studies to be avoided as able.    #HTN  # Hyperkalemia  Improving. Continue amlodipine  WIll hold Lisinopril for today as BPs OK and K+ still high normal    #CAD  Stable. Continue home dose of  ASA + Plavix + statin        Disposition:  Continue treatment of PE  DVTPPx:  Eliquis.  Code:  Full Code  Expected D/C: 12/21-22    Signed by:  Diona Browner, MD  Please contact at phone number [phone] for any questions.

## 2017-12-31 NOTE — Procedures (Signed)
Date: 12/30/2017    REFERRING PHYSICIAN: Princella Pellegrini, MD    PROCEDURE TYPE: EEG performed during wakefulness.    MEDICATIONS:  1.  Norvasc.  2.  Proscar.  3.  Zocor.  4.  Tylenol.    CLINICAL: This routine EEG was performed for this 77 year old  man who had transient confusion to assess for seizures and  epilepsy contributing to etiology.    FINDINGS: The study was performed during wakefulness.  The  predominant activity of wakefulness with eye closure was a  posterior dominant, symmetric, moderate amplitude 8-9 CPS  activity that was reactive.  There was no focal slowing nor any  epileptiform discharges.  There was occasional eye blink and  muscle artifact but the study was interpretable.    Hyperventilation was not performed.    Photic stimulation elicited a symmetric, posterior dominant  driving response.    The EKG tracing identified sinus rhythm.    CLINICAL INTERPRETATION: This routine electroencephalogram  performed during restless wakefulness was normal.  A normal EEG  does not preclude the diagnosis of epilepsy.  Clinical  correlation is advised.        [id number]  DD: 12/31/2017 12:00:14  DT: 12/31/2017 12:27:54  JOB: [id number]

## 2018-01-01 LAB — CBC AND DIFFERENTIAL
Basophils %: 0.4 % (ref 0.0–3.0)
Basophils Absolute: 0 10*3/uL (ref 0.0–0.3)
Eosinophils %: 3.4 % (ref 0.0–7.0)
Eosinophils Absolute: 0.2 10*3/uL (ref 0.0–0.8)
Hematocrit: 37.5 % — ABNORMAL LOW (ref 39.0–52.5)
Hemoglobin: 12.1 gm/dL — ABNORMAL LOW (ref 13.0–17.5)
Lymphocytes Absolute: 1.6 10*3/uL (ref 0.6–5.1)
Lymphocytes: 28.1 % (ref 15.0–46.0)
MCH: 30 pg (ref 28–35)
MCHC: 32 gm/dL (ref 32–36)
MCV: 94 fL (ref 80–100)
MPV: 8.2 fL (ref 6.0–10.0)
Monocytes Absolute: 0.8 10*3/uL (ref 0.1–1.7)
Monocytes: 13.6 % (ref 3.0–15.0)
Neutrophils %: 54.5 % (ref 42.0–78.0)
Neutrophils Absolute: 3.1 10*3/uL (ref 1.7–8.6)
PLT CT: 179 10*3/uL (ref 130–440)
RBC: 4 10*6/uL (ref 4.00–5.70)
RDW: 11.6 % (ref 11.0–14.0)
WBC: 5.6 10*3/uL (ref 4.0–11.0)

## 2018-01-01 LAB — BASIC METABOLIC PANEL
Anion Gap: 9 mMol/L (ref 7.0–18.0)
BUN / Creatinine Ratio: 8 Ratio — ABNORMAL LOW (ref 10.0–30.0)
BUN: 18 mg/dL (ref 7–22)
CO2: 22 mMol/L (ref 20–30)
Calcium: 8.9 mg/dL (ref 8.5–10.5)
Chloride: 109 mMol/L (ref 98–110)
Creatinine: 2.26 mg/dL — ABNORMAL HIGH (ref 0.80–1.30)
EGFR: 31 mL/min/{1.73_m2} — ABNORMAL LOW (ref 60–150)
Glucose: 83 mg/dL (ref 71–99)
Osmolality Calculated: 271 mOsm/kg — ABNORMAL LOW (ref 275–300)
Potassium: 5 mMol/L (ref 3.5–5.3)
Sodium: 135 mMol/L — ABNORMAL LOW (ref 136–147)

## 2018-01-01 LAB — VH DEXTROSE STICK GLUCOSE
Glucose POCT: 83 mg/dL (ref 71–99)
Glucose POCT: 79 mg/dL (ref 71–99)
Glucose POCT: 123 mg/dL — ABNORMAL HIGH (ref 71–99)
Glucose POCT: 82 mg/dL (ref 71–99)
Glucose POCT: 100 mg/dL — ABNORMAL HIGH (ref 71–99)
Glucose POCT: 95 mg/dL (ref 71–99)

## 2018-01-01 MED ORDER — APIXABAN 5 MG PO TABS
10.00 mg | ORAL_TABLET | Freq: Two times a day (BID) | ORAL | 0 refills | Status: AC
Start: 2018-01-01 — End: 2018-01-07

## 2018-01-01 MED ORDER — APIXABAN 5 MG PO TABS
5.0000 mg | ORAL_TABLET | Freq: Two times a day (BID) | ORAL | 0 refills | Status: DC
Start: 2018-01-08 — End: 2018-01-17

## 2018-01-01 NOTE — Plan of Care (Signed)
Problem: Moderate/High Fall Risk Score >5  Goal: Patient will remain free of falls  Outcome: Progressing

## 2018-01-01 NOTE — Progress Notes (Signed)
NURSE NOTE SUMMARY  [place] STEP-DOWN   Patient Name: [name]   Attending Physician: Diona Browner, MD   Today's date:   01/01/2018 LOS: 1 days   Shift Summary:                                                              6979 Received report from previous RN. Pt resting quietly in bed, no s/s distress, CBIR, monitoring.   0845 Supervised pt to eat breakfast, pt tolerated well.    Provider Notifications:      Rapid Response Notifications:  Mobility:      PMP Activity: Step 3 - Bed Mobility (01/01/2018  8:00 AM)     Weight tracking:  Family Dynamic:   Last 3 Weights for the past 72 hrs (Last 3 readings):   Weight   01/01/18 0318 62.1 kg (136 lb 14.5 oz)   12/31/17 0402 63.2 kg (139 lb 5.3 oz)   12/29/17 2207 64 kg (141 lb 1.5 oz)             Recent Vitals Last Bowel Movement   BP: 134/73 (01/01/2018  7:21 AM)  Heart Rate: 73 (01/01/2018  7:21 AM)  Temp: 98.4 F (36.9 C) (01/01/2018  7:21 AM)  Resp Rate: 16 (01/01/2018  7:21 AM)  Weight: 62.1 kg (136 lb 14.5 oz) (01/01/2018  3:18 AM)  SpO2: 99 % (01/01/2018  7:21 AM)   Last BM Date: 12/31/17

## 2018-01-01 NOTE — Discharge Summary (Addendum)
[facility]    Patient Name: [name]  Attending Physician: Diona Browner, MD  Primary Care Physician: Melvyn Neth, MD    Date of Admission: 12/29/2017  Date of Discharge: 01/01/2018  Length of Stay in the Hospital: 1    Discharge Diagnoses:                                                                          Primary Diagnosis:  Acute Bilateral PE  w/ #Acute Metabolic Encephalopathy - resolved     Secondary Diagnoses:  h/o CVA  DM2   Hypoglycemia  Stage 4 CKD  HTN  Hyperkalemia  CAD      Discharge Medications:                                                                            Medication List      START taking these medications    * apixaban 5 MG  Commonly known as:  ELIQUIS  Take 2 tablets (10 mg total) by mouth every 12 (twelve) hours for 6 days     * apixaban 5 MG  Commonly known as:  ELIQUIS  Take 1 tablet (5 mg total) by mouth every 12 (twelve) hours for 15 days  Start taking on:  January 08, 2018         * This list has 2 medication(s) that are the same as other medications prescribed for you. Read the directions carefully, and ask your doctor or other care provider to review them with you.            CONTINUE taking these medications    amLODIPine 5 MG tablet  Commonly known as:  NORVASC  Take 1 tablet (5 mg total) by mouth daily     atropine 1 % ophthalmic solution     clopidogrel 75 mg tablet  Commonly known as:  PLAVIX  Take 1 tablet (75 mg total) by mouth daily     finasteride 5 MG tablet  Commonly known as:  PROSCAR     fish oil 1000 MG Caps capsule     glimepiride 1 MG tablet  Commonly known as:  AMARYL     * ICAPS Caps     * CENTRUM ADULTS Tabs     lisinopril-hydroCHLOROthiazide 20-12.5 MG per tablet  Commonly known as:  PRINZIDE,ZESTORETIC  Take 1 tablet by mouth daily     simvastatin 40 MG tablet  Commonly known as:  ZOCOR     sodium bicarbonate 650 MG tablet     vitamin D 25 MCG (1000 UT) tablet  Commonly known as:   cholecalciferol         * This list has 2 medication(s) that are the same as other medications prescribed for you. Read the directions carefully, and ask your doctor or other care provider to review them with you.  Where to Get Your Medications      You can get these medications from any pharmacy    Bring a paper prescription for each of these medications   apixaban 5 MG   apixaban 5 MG         Consultations:                                                                                       None    Labs:                                                                                         Recent Labs   Lab 01/01/18  0508 12/31/17  1846 12/30/17  0529   WBC 5.6 6.4 6.9   RBC 4.00 4.14 4.05   Hemoglobin 12.1* 12.4* 12.9*   Hematocrit 37.5* 39.1 39.5   MCV 94 94 97   PLT CT 179 188 164       Recent Labs   Lab 01/01/18  0508 12/31/17  1846 12/30/17  0529 12/29/17  1456   Sodium 135* 135* 136 136   Potassium 5.0 4.8 4.8 5.4*   Chloride 109 104 111* 105   CO2 22 24 18* 24   BUN 18 18 21  23*   Creatinine 2.26* 2.22* 2.32* 2.76*   Glucose 83 104* 63* 138*   Calcium 8.9 8.7 8.3* 8.5       Recent Labs   Lab 12/29/17  1456   ALT 29   AST (SGOT) 26   Bilirubin, Total 0.4   Albumin 3.6   Alkaline Phosphatase 53       Recent Labs   Lab 12/29/17  2153   PT INR 1.0       Imaging studies:                                                                                       Mri Brain Wo Contrast Panel    Result Date: 12/29/2017  Abnormal MRI scan of the head demonstrating: 1. There are no acute intracranial lesions. 2. There are chronic focal infarctions within the right and left paramedian basis pontis. 3. There is a chronic infarction of the left corona radiata extending to the centrum semiovale. 4. There is moderate to severe cortical and subcortical atrophy. This is asymmetric and more severe involving the left compare the right hemisphere. This asymmetry can suggest a neurodegenerative process such as primary  progressive aphasia.  ReadingStation:[id number]    Nm Pulmonary Ventilation And Perfusion (aerosol Or Gas)    Result Date: 12/29/2017  2 small subsegmental mismatched defects are considered intermediate probability for pulmonary emboli by modified PIOPED II criteria. ReadingStation:[id number]    US Renal Kidney Bladder Complete    Result Date: 12/07/2017  Findings consistent with medical renal disease. Nonobstructing 6 mm left mid to lower intrarenal calculus. Enlarged prostate and mild bladder trabeculation without significant post void residual. ReadingStation:[id number]    Xr Chest Ap Portable    Result Date: 12/29/2017  Hypoventilatory study without evidence of acute intrathoracic pathology. ReadingStation:[id number]    US Venous Low Extrem Duplx Dopp Comp Bilat    Result Date: 12/29/2017  Impression: 1. Probable nonocclusive thrombus at the junction of the greater saphenous vein with the common femoral vein of the left leg. 2. No findings of deep venous thrombosis within the regions examined of the right leg. The findings were communicated to nurse Karrie Doffing by the sonographer at 9:20 PM on 12/29/2017. ReadingStation:[email or web address]      Culture results:                                                                                         Microbiology Results     None          Hospital Course:                                                                                    For full details of the patient's admission please consult the whole medical record including daily progress notes, history and physical, consult notes, lab reports as well as imaging studies.  Briefly:    Pt is a 77/M w/ PMHx of CAD, DM2, HTN, CKD 4, expressive aphasia post CVA    He presented to care d/t sudden onset of confusion, drooling  + SOB. In the ED, found to have high normal K+ (5.4) and chronic CVA w/o any acute finding. VQ scan showed PE risk bilaterally and [place] of LE showed DVTs. Started on IV Heparin doing well./ EEG assessed  and w/o seizure. Transitioned to PO eliquis.    Pt. doing well. Family + Caretaker to bedside and confirm that patient is back to his baseline level of fucntioning w/o SOB, drooling, and speaking at his usual baseline level. MRI Brain is *negative* for any acute CVA. Plan d/c on eliquis to manage his PE and f/u w/ PCP to determine long term AC needs.    Discharge Day Exam:  Vitals:  T:98.2 F (36.8 C) (Oral)   BP:124/70, HR:68, RR:16, SaO2:98%  General:             Awake, Alert. No Acute Distress.  HEENT:              Normocephalic Atraumatic. Mucus Membranes Moist, EOMI,  PERRL  Cardiovascular:  RRR. Normal Heart Sounds. Brisk cap refill  Respiratory:        CTAB. No rales, rhonchi or retractions.  Abdomen/GI:      Nontender, nondistended. Normoactive BS  Integument:        Skin intact, no bruises.  Neuro/EXT:        Contracture of R. UE and bilateral LE weakness. +Expressive Aphasia w/o pitting edema. No swelling or gross deformity. Limited ability to move arms, ambulate, and perform ADL's requiring home therapy. +possible continued speech therapy in home setting.  Psychiatric:         Cooperative. Unable to assess orientation as patient not answering questions.         Weight Monitoring 11/28/2017 12/29/2017 12/29/2017 12/31/2017 01/01/2018   Height 167.6 cm - 167.6 cm - -   Weight 66.679 kg 64 kg 64 kg 63.2 kg 62.1 kg   Weight Method - Actual - Standing Scale Standing Scale   BMI (calculated) 23.8 kg/m2 - 22.8 kg/m2 - -           Discharge condition: stable    Discharge instructions:                                                                             Activity: As Tolerated    Discharge Instructions/ Follow up:  Pt. is to keep the appointments as listed below.    Follow-up Information     Dressler, Clarita Crane, MD .    Specialty:  Family Medicine  Contact information:  [place] [address]  [phone]                   Time spent coordinating discharge and reviewing discharge  plan: 41 minutes    Signed by:   Diona Browner  01/01/2018  2:41 PM    [facility]  Office number [phone]    CC: Dressler, Clarita Crane, MD

## 2018-01-01 NOTE — Progress Notes (Signed)
NURSE NOTE SUMMARY  [facility] STEP-DOWN   Patient Name: [name]   Attending Physician: Diona Browner, MD   Today's date:   01/01/2018 LOS: 1 days   Shift Summary:                                                              Discussed d/c instructions with pt and family, all questions answered. IV out, tele off, this RN to provide w/c transport to main lobby.    Provider Notifications:      Rapid Response Notifications:  Mobility:      PMP Activity: Step 3 - Bed Mobility (01/01/2018  8:00 AM)     Weight tracking:  Family Dynamic:   Last 3 Weights for the past 72 hrs (Last 3 readings):   Weight   01/01/18 0318 62.1 kg (136 lb 14.5 oz)   12/31/17 0402 63.2 kg (139 lb 5.3 oz)   12/29/17 2207 64 kg (141 lb 1.5 oz)             Recent Vitals Last Bowel Movement   BP: 124/70 (01/01/2018 11:30 AM)  Heart Rate: 68 (01/01/2018 11:30 AM)  Temp: 98.2 F (36.8 C) (01/01/2018 11:30 AM)  Resp Rate: 16 (01/01/2018 11:30 AM)  Weight: 62.1 kg (136 lb 14.5 oz) (01/01/2018  3:18 AM)  SpO2: 98 % (01/01/2018 11:30 AM)   Last BM Date: 12/31/17

## 2018-01-09 ENCOUNTER — Telehealth (RURAL_HEALTH_CENTER): Payer: Self-pay | Admitting: Physician Assistant

## 2018-01-09 DIAGNOSIS — I2510 Atherosclerotic heart disease of native coronary artery without angina pectoris: Secondary | ICD-10-CM

## 2018-01-09 DIAGNOSIS — I2699 Other pulmonary embolism without acute cor pulmonale: Secondary | ICD-10-CM

## 2018-01-09 DIAGNOSIS — N401 Enlarged prostate with lower urinary tract symptoms: Secondary | ICD-10-CM

## 2018-01-09 DIAGNOSIS — I10 Essential (primary) hypertension: Secondary | ICD-10-CM

## 2018-01-09 DIAGNOSIS — R4701 Aphasia: Secondary | ICD-10-CM

## 2018-01-09 DIAGNOSIS — H409 Unspecified glaucoma: Secondary | ICD-10-CM

## 2018-01-09 DIAGNOSIS — N184 Chronic kidney disease, stage 4 (severe): Secondary | ICD-10-CM

## 2018-01-09 DIAGNOSIS — E1129 Type 2 diabetes mellitus with other diabetic kidney complication: Secondary | ICD-10-CM

## 2018-01-09 DIAGNOSIS — R809 Proteinuria, unspecified: Secondary | ICD-10-CM

## 2018-01-09 DIAGNOSIS — I6930 Unspecified sequelae of cerebral infarction: Secondary | ICD-10-CM

## 2018-01-09 DIAGNOSIS — E7800 Pure hypercholesterolemia, unspecified: Secondary | ICD-10-CM

## 2018-01-09 NOTE — Telephone Encounter (Signed)
[facility] called today needing orders for [name]. He was admitted to hosp on 12/29/17 and discharged on 01/01/18. Apparently [name] niece did not tell them at the hospital that he actually had home health. They are unable to see patient without any orders. [name] hosp f/u visit on 01/17/18 was scheduled with Dr. Berdine Dance per his niece. How can we proceed with this since Dr. Berdine Dance has not seen patient?

## 2018-01-10 NOTE — Telephone Encounter (Signed)
I put in referral to resume home health    Dr Berdine Dance, front office told me that pt's family wants to see a doctor from now own and this patient has a hospital follow up with you in January but plans on seeing you for [facility] for chronic issues    Thanks    Griffin Gerrard

## 2018-01-10 NOTE — Telephone Encounter (Signed)
9:37am [facility]

## 2018-01-12 NOTE — Telephone Encounter (Signed)
10:21am [facility]

## 2018-01-17 ENCOUNTER — Encounter (RURAL_HEALTH_CENTER): Payer: Self-pay | Admitting: Family Medicine

## 2018-01-17 ENCOUNTER — Ambulatory Visit: Payer: Medicare Other | Attending: Family Medicine | Admitting: Family Medicine

## 2018-01-17 ENCOUNTER — Ambulatory Visit (RURAL_HEALTH_CENTER): Payer: Medicare Other

## 2018-01-17 VITALS — BP 130/60 | HR 58 | Temp 97.2°F | Resp 18 | Ht 70.0 in | Wt 143.1 lb

## 2018-01-17 DIAGNOSIS — H409 Unspecified glaucoma: Secondary | ICD-10-CM

## 2018-01-17 DIAGNOSIS — I6930 Unspecified sequelae of cerebral infarction: Secondary | ICD-10-CM

## 2018-01-17 DIAGNOSIS — R4182 Altered mental status, unspecified: Secondary | ICD-10-CM

## 2018-01-17 DIAGNOSIS — N183 Chronic kidney disease, stage 3 unspecified: Secondary | ICD-10-CM

## 2018-01-17 DIAGNOSIS — E1122 Type 2 diabetes mellitus with diabetic chronic kidney disease: Secondary | ICD-10-CM

## 2018-01-17 DIAGNOSIS — R3911 Hesitancy of micturition: Secondary | ICD-10-CM

## 2018-01-17 DIAGNOSIS — R4701 Aphasia: Secondary | ICD-10-CM

## 2018-01-17 DIAGNOSIS — R001 Bradycardia, unspecified: Secondary | ICD-10-CM

## 2018-01-17 DIAGNOSIS — I10 Essential (primary) hypertension: Secondary | ICD-10-CM

## 2018-01-17 DIAGNOSIS — I2699 Other pulmonary embolism without acute cor pulmonale: Secondary | ICD-10-CM

## 2018-01-17 DIAGNOSIS — N184 Chronic kidney disease, stage 4 (severe): Secondary | ICD-10-CM

## 2018-01-17 DIAGNOSIS — N401 Enlarged prostate with lower urinary tract symptoms: Secondary | ICD-10-CM

## 2018-01-17 DIAGNOSIS — R809 Proteinuria, unspecified: Secondary | ICD-10-CM

## 2018-01-17 DIAGNOSIS — E785 Hyperlipidemia, unspecified: Secondary | ICD-10-CM

## 2018-01-17 DIAGNOSIS — E1129 Type 2 diabetes mellitus with other diabetic kidney complication: Secondary | ICD-10-CM

## 2018-01-17 MED ORDER — SIMVASTATIN 40 MG PO TABS
20.00 mg | ORAL_TABLET | Freq: Every evening | ORAL | 1 refills | Status: AC
Start: 2018-01-17 — End: ?

## 2018-01-17 MED ORDER — ATROPINE SULFATE 1 % OP SOLN
1.00 [drp] | Freq: Four times a day (QID) | OPHTHALMIC | 0 refills | Status: DC
Start: 2018-01-17 — End: 2018-02-03

## 2018-01-17 MED ORDER — APIXABAN 5 MG PO TABS
5.00 mg | ORAL_TABLET | Freq: Two times a day (BID) | ORAL | 0 refills | Status: AC
Start: 2018-01-17 — End: 2018-02-01

## 2018-01-17 MED ORDER — GLIMEPIRIDE 1 MG PO TABS
1.00 mg | ORAL_TABLET | Freq: Every morning | ORAL | 2 refills | Status: AC
Start: 2018-01-17 — End: ?

## 2018-01-17 MED ORDER — AMLODIPINE BESYLATE 5 MG PO TABS
5.0000 mg | ORAL_TABLET | Freq: Every day | ORAL | 2 refills | Status: DC
Start: ? — End: 2018-01-17

## 2018-01-17 MED ORDER — SODIUM BICARBONATE 650 MG PO TABS
650.00 mg | ORAL_TABLET | Freq: Two times a day (BID) | ORAL | 2 refills | Status: AC
Start: 2018-01-17 — End: ?

## 2018-01-17 MED ORDER — FINASTERIDE 5 MG PO TABS
5.00 mg | ORAL_TABLET | Freq: Every morning | ORAL | 2 refills | Status: AC
Start: 2018-01-17 — End: ?

## 2018-01-17 NOTE — Progress Notes (Signed)
Progress Note      Patient Name:  [name]    Subjective/HPI Comments:    Patient is a  78 y.o. male who presents with with his niece here today.  The patient is nonverbal and he has had a previous stroke; his niece says that he  has increased dementia and increased anger issues now.  He is requiring 24 hours a day care and she is doing this herself and she requests that he be put in a nursing home.  She also inquired as to whether the Eliquis could be changed to something else as it is too expensive.  He was an inpatient in the hospital 12/29/17 - 01/01/18, with primary diagnosis of: acute bilateral pulmonary emboli with acute metabolic encephalopathy-resolved.  Secondary diagnoses were: history of CVA, diabetes mellitus type 2, hypoglycemia, stage IV CKD, hypertension, hyperkalemia, and  CAD.  He also has a history of chronic BPH, dyslipidemia, glaucoma, and history of a right cataract.  Gait uses a cane to walk and has weakness and atrophy of his right arm.      Chief Complaint   Patient presents with    Follow-up     f/u from hospital       Outpatient Medications Marked as Taking for the 01/17/18 encounter (Office Visit) with Aviyah Swetz, Clarita Crane, MD   Medication Sig Dispense Refill    amLODIPine (NORVASC) 5 MG tablet Take 1 tablet (5 mg total) by mouth daily 30 tablet 2    apixaban (ELIQUIS) 5 MG Take 1 tablet (5 mg total) by mouth every 12 (twelve) hours for 15 days 30 tablet 0    atropine 1 % ophthalmic solution Place 1 drop into the right eye 4 (four) times daily      clopidogrel (PLAVIX) 75 mg tablet Take 1 tablet (75 mg total) by mouth daily 30 tablet 2    finasteride (PROSCAR) 5 MG tablet Take 5 mg by mouth every morning         glimepiride (AMARYL) 1 MG tablet Take 1 mg by mouth every morning before breakfast      lisinopril-hydroCHLOROthiazide (PRINZIDE,ZESTORETIC) 20-12.5 MG per tablet Take 1 tablet by mouth daily 30 tablet 2    Multiple Vitamins-Minerals (CENTRUM ADULTS) Tab Take 1  tablet by mouth every morning         Omega-3 Fatty Acids (FISH OIL) 1000 MG Cap capsule Take 1 capsule by mouth every morning      simvastatin (ZOCOR) 40 MG tablet Take 20 mg by mouth nightly Take 1/2 tablet (20mg ) every night at bedtine      sodium bicarbonate 650 MG tablet Take 650 mg by mouth 2 (two) times daily Taking 10 grain 2 times a day        vitamin D (CHOLECALCIFEROL) 25 MCG (1000 UT) tablet Take 25 mcg by mouth every morning          No Known Allergies  Past Medical History:   Diagnosis Date    Cataracts, bilateral     Diabetes mellitus     Hypertension     Kidney disorder     stage IV kidney disease    Stroke      Past Surgical History:   Procedure Laterality Date    EYE SURGERY      LUNG LOBECTOMY      right eye transplant      unsuccessful     Social History     Occupational History    Not on  file   Tobacco Use    Smoking status: Never Smoker    Smokeless tobacco: Never Used   Substance and Sexual Activity    Alcohol use: Never     Frequency: Never    Drug use: Never    Sexual activity: Not Currently     Family History   Problem Relation Age of Onset    Heart attack Mother     Heart attack Father     Heart attack Sister     Cancer Brother     Heart attack Maternal Aunt     Heart attack Maternal Uncle     No known problems Paternal Aunt     No known problems Paternal Uncle     No known problems Maternal Grandmother     No known problems Maternal Grandfather     No known problems Paternal Grandmother     No known problems Paternal Grandfather        Review of Systems:  Review of Systems   Constitutional: Negative for chills, diaphoresis, fever, malaise/fatigue and weight loss.        Patient is nonverbal.   HENT: Negative for congestion, ear discharge, ear pain, sinus pain and sore throat.    Eyes: Negative for blurred vision, pain, discharge and redness.   Respiratory: Negative for cough, shortness of breath and wheezing.    Cardiovascular: Negative for chest pain,  palpitations and orthopnea.   Gastrointestinal: Negative for abdominal pain, diarrhea, nausea and vomiting.   Genitourinary: Negative for dysuria, frequency and urgency.   Musculoskeletal: Negative for joint pain and myalgias.   Skin: Negative for rash.   Neurological: Negative for dizziness, tingling, weakness and headaches.   Endo/Heme/Allergies: Negative for environmental allergies and polydipsia. Does not bruise/bleed easily.   Psychiatric/Behavioral: Negative for depression and suicidal ideas. The patient is not nervous/anxious and does not have insomnia.        Objective/ Physical Exam:  Vitals:    01/17/18 1037   BP: 130/60   BP Site: Right arm   Patient Position: Sitting   Cuff Size: Large   Pulse: (!) 58   Resp: 18   Temp: 97.2 F (36.2 C)   TempSrc: Tympanic   SpO2: 92%   Weight: 64.9 kg (143 lb 1.6 oz)   Height: 1.778 m (5\' 10" )       Body mass index is 20.53 kg/m.    Physical Exam  Vitals signs reviewed.   Constitutional:       Appearance: He is well-developed.   HENT:      Head: Normocephalic and atraumatic.      Right Ear: External ear normal.      Left Ear: External ear normal.      Mouth/Throat:      Pharynx: No oropharyngeal exudate.   Eyes:      Conjunctiva/sclera: Conjunctivae normal.      Pupils: Pupils are equal, round, and reactive to light.      Comments: Right cataract   Neck:      Thyroid: No thyromegaly.   Cardiovascular:      Rate and Rhythm: Regular rhythm. Bradycardia present.      Heart sounds: Normal heart sounds. No murmur. No friction rub. No gallop.    Pulmonary:      Effort: Pulmonary effort is normal. No respiratory distress.      Breath sounds: Normal breath sounds. No wheezing or rales.   Abdominal:      General: Bowel sounds are normal.  There is no distension.      Palpations: Abdomen is soft.      Tenderness: There is no abdominal tenderness.   Musculoskeletal: Normal range of motion.   Skin:     General: Skin is warm and dry.   Neurological:      Mental Status: He is alert.       Motor: Weakness present.      Gait: Gait abnormal.   Psychiatric:         Behavior: Behavior normal.         Thought Content: Thought content normal.         Judgment: Judgment normal.       Assessment:  1. Benign essential HTN     2. Dyslipidemia     3. Altered mental status, unspecified altered mental status type     4. Bradycardia     5. Glaucoma, unspecified glaucoma type, unspecified laterality     6. History of stroke with residual deficit     7. Nonverbal     8. Benign prostatic hyperplasia with urinary hesitancy     9. CKD (chronic kidney disease), stage IV     10. Type 2 diabetes mellitus with microalbuminuria, without long-term current use of insulin     11. Bilateral pulmonary embolism             Plan:  I discussed the case with Kyra Leyland nurse navigator who will start working on getting the patient in a nursing home.  The patient has multiple consults pending  Medicine refills done  Outpatient Encounter Medications as of 01/17/2018   Medication Sig Dispense Refill    amLODIPine (NORVASC) 5 MG tablet Take 1 tablet (5 mg total) by mouth daily 30 tablet 2    apixaban (ELIQUIS) 5 MG Take 1 tablet (5 mg total) by mouth every 12 (twelve) hours for 15 days 30 tablet 0    atropine 1 % ophthalmic solution Place 1 drop into the right eye 4 (four) times daily 5 mL 0    clopidogrel (PLAVIX) 75 mg tablet Take 1 tablet (75 mg total) by mouth daily 30 tablet 2    finasteride (PROSCAR) 5 MG tablet Take 1 tablet (5 mg total) by mouth every morning 30 tablet 2    glimepiride (AMARYL) 1 MG tablet Take 1 tablet (1 mg total) by mouth every morning before breakfast 30 tablet 2    Multiple Vitamins-Minerals (CENTRUM ADULTS) Tab Take 1 tablet by mouth every morning         Omega-3 Fatty Acids (FISH OIL) 1000 MG Cap capsule Take 1 capsule by mouth every morning      simvastatin (ZOCOR) 40 MG tablet Take 0.5 tablets (20 mg total) by mouth nightly Take 1/2 tablet (20mg ) every night at bedtine 30 tablet 1    sodium  bicarbonate 650 MG tablet Take 1 tablet (650 mg total) by mouth 2 (two) times daily Taking 10 grain 2 times a day 60 tablet 2    vitamin D (CHOLECALCIFEROL) 25 MCG (1000 UT) tablet Take 25 mcg by mouth every morning         [DISCONTINUED] amLODIPine (NORVASC) 5 MG tablet Take 1 tablet (5 mg total) by mouth daily 30 tablet 2    [DISCONTINUED] apixaban (ELIQUIS) 5 MG Take 1 tablet (5 mg total) by mouth every 12 (twelve) hours for 15 days 30 tablet 0    [DISCONTINUED] atropine 1 % ophthalmic solution Place 1 drop into the right eye  4 (four) times daily      [DISCONTINUED] finasteride (PROSCAR) 5 MG tablet Take 5 mg by mouth every morning         [DISCONTINUED] glimepiride (AMARYL) 1 MG tablet Take 1 mg by mouth every morning before breakfast      [DISCONTINUED] lisinopril-hydroCHLOROthiazide (PRINZIDE,ZESTORETIC) 20-12.5 MG per tablet Take 1 tablet by mouth daily 30 tablet 2    [DISCONTINUED] simvastatin (ZOCOR) 40 MG tablet Take 20 mg by mouth nightly Take 1/2 tablet (20mg ) every night at bedtine      [DISCONTINUED] sodium bicarbonate 650 MG tablet Take 650 mg by mouth 2 (two) times daily Taking 10 grain 2 times a day        [DISCONTINUED] Multiple Vitamins-Minerals (ICAPS) Cap Take 1 capsule by mouth every morning          No facility-administered encounter medications on file as of 01/17/2018.            The patient has been informed of all ordered tests and/or consults, if applicable.  All patient concerns and questions have been addressed.      Melvyn Neth, MD  [facility]  [facility]  [phone]  [place] [address]  [phone]    This note was generated by the Epic EMR system/ Dragon speech recognition and may contain inherent errors or omissions not intended by the user. Grammatical errors, random word insertions, deletions, pronoun errors and incomplete sentences are occasional consequences of this  technology due to software limitations. Not all errors are caught or corrected. If there are questions or concerns about the content of this note or information contained within the body of this dictation they should be addressed directly with the author for clarification
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
## 2018-01-18 ENCOUNTER — Telehealth (RURAL_HEALTH_CENTER): Payer: Self-pay

## 2018-01-18 NOTE — Telephone Encounter (Signed)
Ok I just wanted to make sure.  So I can call home health back and let them know what is being requested.

## 2018-01-18 NOTE — Telephone Encounter (Signed)
The patient's niece requested that the patient be placed in a nursing home as soon as possible as she is unable to care for him at home by herself.  Kyra Leyland, Nurse Navigator,was consulted consulted and she is working on getting the patient placed in a nursing home and also will try to get him some help with his Eliquis as it is too expensive.

## 2018-01-18 NOTE — Telephone Encounter (Signed)
[name] from [place] on nurse line pt was suppose to be evaluated at  [facility] appt for home health, PT and OT, home health is waiting on a face to face (office visit notes) phone: [phone] Fax number: [phone].  I do not see in yesterdays notes anything about these. I did see a note regarding a nursing home.  Please advise.

## 2018-01-20 NOTE — Telephone Encounter (Signed)
Pt has been seen since. Closing encounter.

## 2018-01-20 NOTE — Telephone Encounter (Signed)
PCP participates in Epic. Updates requested in Care Everywhere.

## 2018-01-25 ENCOUNTER — Other Ambulatory Visit (RURAL_HEALTH_CENTER): Payer: Self-pay

## 2018-01-25 NOTE — Progress Notes (Signed)
[facility] to return my phone call.     Kyra Leyland, MSN BSN, [place], [facility]  Nurse Navigator   Phone: [phone]  [phone]  Fax [phone]  [email or web address]

## 2018-01-26 ENCOUNTER — Ambulatory Visit: Payer: Self-pay | Admitting: Cardiovascular Disease

## 2018-01-31 ENCOUNTER — Other Ambulatory Visit (RURAL_HEALTH_CENTER): Payer: Self-pay

## 2018-01-31 DIAGNOSIS — R4701 Aphasia: Secondary | ICD-10-CM

## 2018-01-31 DIAGNOSIS — I10 Essential (primary) hypertension: Secondary | ICD-10-CM

## 2018-01-31 DIAGNOSIS — R4182 Altered mental status, unspecified: Secondary | ICD-10-CM

## 2018-01-31 DIAGNOSIS — I6930 Unspecified sequelae of cerebral infarction: Secondary | ICD-10-CM

## 2018-01-31 DIAGNOSIS — N183 Chronic kidney disease, stage 3 unspecified: Secondary | ICD-10-CM
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
## 2018-02-03 ENCOUNTER — Emergency Department: Payer: Medicare Other

## 2018-02-03 ENCOUNTER — Inpatient Hospital Stay
Admission: EM | Admit: 2018-02-03 | Discharge: 2018-03-12 | DRG: 177 | Disposition: E | Payer: Medicare Other | Attending: Internal Medicine | Admitting: Internal Medicine

## 2018-02-03 ENCOUNTER — Observation Stay: Payer: Medicare Other

## 2018-02-03 DIAGNOSIS — N289 Disorder of kidney and ureter, unspecified: Secondary | ICD-10-CM

## 2018-02-03 DIAGNOSIS — R402222 Coma scale, best verbal response, incomprehensible words, at arrival to emergency department: Secondary | ICD-10-CM | POA: Diagnosis present

## 2018-02-03 DIAGNOSIS — W19XXXA Unspecified fall, initial encounter: Secondary | ICD-10-CM

## 2018-02-03 DIAGNOSIS — I639 Cerebral infarction, unspecified: Secondary | ICD-10-CM | POA: Diagnosis present

## 2018-02-03 DIAGNOSIS — Z515 Encounter for palliative care: Secondary | ICD-10-CM | POA: Diagnosis not present

## 2018-02-03 DIAGNOSIS — H269 Unspecified cataract: Secondary | ICD-10-CM | POA: Diagnosis present

## 2018-02-03 DIAGNOSIS — S065X0A Traumatic subdural hemorrhage without loss of consciousness, initial encounter: Secondary | ICD-10-CM | POA: Diagnosis present

## 2018-02-03 DIAGNOSIS — S0101XA Laceration without foreign body of scalp, initial encounter: Secondary | ICD-10-CM | POA: Diagnosis present

## 2018-02-03 DIAGNOSIS — E1122 Type 2 diabetes mellitus with diabetic chronic kidney disease: Secondary | ICD-10-CM | POA: Diagnosis present

## 2018-02-03 DIAGNOSIS — D72829 Elevated white blood cell count, unspecified: Secondary | ICD-10-CM

## 2018-02-03 DIAGNOSIS — Z7902 Long term (current) use of antithrombotics/antiplatelets: Secondary | ICD-10-CM

## 2018-02-03 DIAGNOSIS — R1312 Dysphagia, oropharyngeal phase: Secondary | ICD-10-CM | POA: Diagnosis present

## 2018-02-03 DIAGNOSIS — N183 Chronic kidney disease, stage 3 unspecified: Secondary | ICD-10-CM | POA: Diagnosis present

## 2018-02-03 DIAGNOSIS — R402142 Coma scale, eyes open, spontaneous, at arrival to emergency department: Secondary | ICD-10-CM | POA: Diagnosis present

## 2018-02-03 DIAGNOSIS — I2510 Atherosclerotic heart disease of native coronary artery without angina pectoris: Secondary | ICD-10-CM | POA: Diagnosis present

## 2018-02-03 DIAGNOSIS — R197 Diarrhea, unspecified: Secondary | ICD-10-CM | POA: Diagnosis not present

## 2018-02-03 DIAGNOSIS — S065XAA Traumatic subdural hemorrhage with loss of consciousness status unknown, initial encounter: Secondary | ICD-10-CM | POA: Diagnosis present

## 2018-02-03 DIAGNOSIS — Z902 Acquired absence of lung [part of]: Secondary | ICD-10-CM

## 2018-02-03 DIAGNOSIS — Z86711 Personal history of pulmonary embolism: Secondary | ICD-10-CM

## 2018-02-03 DIAGNOSIS — N184 Chronic kidney disease, stage 4 (severe): Secondary | ICD-10-CM | POA: Diagnosis present

## 2018-02-03 DIAGNOSIS — J9811 Atelectasis: Secondary | ICD-10-CM | POA: Diagnosis present

## 2018-02-03 DIAGNOSIS — I129 Hypertensive chronic kidney disease with stage 1 through stage 4 chronic kidney disease, or unspecified chronic kidney disease: Secondary | ICD-10-CM | POA: Diagnosis present

## 2018-02-03 DIAGNOSIS — J90 Pleural effusion, not elsewhere classified: Secondary | ICD-10-CM | POA: Diagnosis not present

## 2018-02-03 DIAGNOSIS — Y92009 Unspecified place in unspecified non-institutional (private) residence as the place of occurrence of the external cause: Secondary | ICD-10-CM

## 2018-02-03 DIAGNOSIS — J690 Pneumonitis due to inhalation of food and vomit: Principal | ICD-10-CM | POA: Diagnosis present

## 2018-02-03 DIAGNOSIS — F0150 Vascular dementia without behavioral disturbance: Secondary | ICD-10-CM | POA: Diagnosis present

## 2018-02-03 DIAGNOSIS — R402362 Coma scale, best motor response, obeys commands, at arrival to emergency department: Secondary | ICD-10-CM | POA: Diagnosis present

## 2018-02-03 DIAGNOSIS — T18108A Unspecified foreign body in esophagus causing other injury, initial encounter: Secondary | ICD-10-CM | POA: Diagnosis present

## 2018-02-03 DIAGNOSIS — R4701 Aphasia: Secondary | ICD-10-CM | POA: Diagnosis present

## 2018-02-03 DIAGNOSIS — W109XXA Fall (on) (from) unspecified stairs and steps, initial encounter: Secondary | ICD-10-CM | POA: Diagnosis present

## 2018-02-03 DIAGNOSIS — Z8673 Personal history of transient ischemic attack (TIA), and cerebral infarction without residual deficits: Secondary | ICD-10-CM

## 2018-02-03 DIAGNOSIS — F0390 Unspecified dementia without behavioral disturbance: Secondary | ICD-10-CM

## 2018-02-03 DIAGNOSIS — E1165 Type 2 diabetes mellitus with hyperglycemia: Secondary | ICD-10-CM

## 2018-02-03 DIAGNOSIS — I10 Essential (primary) hypertension: Secondary | ICD-10-CM | POA: Diagnosis present

## 2018-02-03 DIAGNOSIS — Z66 Do not resuscitate: Secondary | ICD-10-CM | POA: Diagnosis not present

## 2018-02-03 DIAGNOSIS — N179 Acute kidney failure, unspecified: Secondary | ICD-10-CM | POA: Diagnosis present

## 2018-02-03 DIAGNOSIS — Z8249 Family history of ischemic heart disease and other diseases of the circulatory system: Secondary | ICD-10-CM

## 2018-02-03 DIAGNOSIS — Z809 Family history of malignant neoplasm, unspecified: Secondary | ICD-10-CM

## 2018-02-03 DIAGNOSIS — W108XXA Fall (on) (from) other stairs and steps, initial encounter: Secondary | ICD-10-CM | POA: Diagnosis present

## 2018-02-03 LAB — VH I-STAT CHEM 8 NOTIFICATION

## 2018-02-03 LAB — VH URINALYSIS WITH MICROSCOPIC AND CULTURE IF INDICATED
Bilirubin, UA: NEGATIVE
Blood, UA: NEGATIVE
Glucose, UA: >=500 mg/dL — AB
Ketones UA: 5 mg/dL
Leukocyte Esterase, UA: NEGATIVE Leu/uL
Nitrite, UA: NEGATIVE
Protein, UR: NEGATIVE mg/dL
RBC, UA: 1 /hpf (ref 0–4)
Urine Specific Gravity: 1.011 (ref 1.001–1.040)
Urobilinogen, UA: NORMAL mg/dL
pH, Urine: 7 pH (ref 5.0–8.0)

## 2018-02-03 LAB — I-STAT CHEM 8 CARTRIDGE
Anion Gap I-Stat: 16 (ref 7.0–16.0)
BUN I-Stat: 24 mg/dL — ABNORMAL HIGH (ref 7–22)
Calcium Ionized I-Stat: 4.3 mg/dL — ABNORMAL LOW (ref 4.35–5.10)
Chloride I-Stat: 105 mMol/L (ref 98–110)
Creatinine I-Stat: 2.4 mg/dL — ABNORMAL HIGH (ref 0.80–1.30)
EGFR: 29 mL/min/{1.73_m2} — ABNORMAL LOW (ref 60–150)
Glucose I-Stat: 346 mg/dL — ABNORMAL HIGH (ref 71–99)
Hematocrit I-Stat: 39 % (ref 39.0–52.5)
Hemoglobin I-Stat: 13.3 gm/dL (ref 13.0–17.5)
Potassium I-Stat: 5 mMol/L (ref 3.5–5.3)
Sodium I-Stat: 135 mMol/L — ABNORMAL LOW (ref 136–147)
TCO2 I-Stat: 20 mMol/L — ABNORMAL LOW (ref 24–29)

## 2018-02-03 LAB — CBC AND DIFFERENTIAL
Basophils %: 0.3 % (ref 0.0–3.0)
Basophils Absolute: 0 10*3/uL (ref 0.0–0.3)
Eosinophils %: 0.2 % (ref 0.0–7.0)
Eosinophils Absolute: 0 10*3/uL (ref 0.0–0.8)
Hematocrit: 41.3 % (ref 39.0–52.5)
Hemoglobin: 12.9 gm/dL — ABNORMAL LOW (ref 13.0–17.5)
Lymphocytes Absolute: 1.7 10*3/uL (ref 0.6–5.1)
Lymphocytes: 10.8 % — ABNORMAL LOW (ref 15.0–46.0)
MCH: 30 pg (ref 28–35)
MCHC: 31 gm/dL — ABNORMAL LOW (ref 32–36)
MCV: 96 fL (ref 80–100)
MPV: 8.4 fL (ref 6.0–10.0)
Monocytes Absolute: 0.7 10*3/uL (ref 0.1–1.7)
Monocytes: 4.5 % (ref 3.0–15.0)
Neutrophils %: 84.3 % — ABNORMAL HIGH (ref 42.0–78.0)
Neutrophils Absolute: 13 10*3/uL — ABNORMAL HIGH (ref 1.7–8.6)
PLT CT: 202 10*3/uL (ref 130–440)
RBC: 4.31 10*6/uL (ref 4.00–5.70)
RDW: 11.7 % (ref 11.0–14.0)
WBC: 15.4 10*3/uL — ABNORMAL HIGH (ref 4.0–11.0)

## 2018-02-03 LAB — CK: Creatine Kinase (CK): 375 U/L — ABNORMAL HIGH (ref 30–230)

## 2018-02-03 LAB — PT/INR
PT INR: 1.2 (ref 0.5–1.3)
PT: 12.3 s — ABNORMAL HIGH (ref 9.5–11.5)

## 2018-02-03 LAB — I-STAT TROPONIN: Troponin I I-Stat: 0.05 ng/mL (ref 0.00–0.08)

## 2018-02-03 LAB — VH DEXTROSE STICK GLUCOSE: Glucose POCT: 160 mg/dL — ABNORMAL HIGH (ref 71–99)

## 2018-02-03 LAB — HEMOGLOBIN A1C: Hgb A1C, %: 5.7 %

## 2018-02-03 LAB — VH I-STAT TROPONIN NOTIFICATION

## 2018-02-03 MED ORDER — INSULIN LISPRO (1 UNIT DIAL) 100 UNIT/ML SC SOPN
1.00 [IU] | PEN_INJECTOR | Freq: Every day | SUBCUTANEOUS | Status: DC | PRN
Start: 2018-02-03 — End: 2018-02-15
  Administered 2018-02-05 – 2018-02-07 (×3): 1 [IU] via SUBCUTANEOUS

## 2018-02-03 MED ORDER — ONDANSETRON HCL 4 MG/2ML IJ SOLN
INTRAMUSCULAR | Status: AC
Start: 2018-02-03 — End: ?
  Filled 2018-02-03: qty 2

## 2018-02-03 MED ORDER — INSULIN LISPRO (1 UNIT DIAL) 100 UNIT/ML SC SOPN
1.00 [IU] | PEN_INJECTOR | Freq: Three times a day (TID) | SUBCUTANEOUS | Status: DC
Start: 2018-02-04 — End: 2018-02-15
  Administered 2018-02-04: 12:00:00 2 [IU] via SUBCUTANEOUS
  Administered 2018-02-05 (×2): 1 [IU] via SUBCUTANEOUS
  Administered 2018-02-06 – 2018-02-07 (×4): 2 [IU] via SUBCUTANEOUS
  Administered 2018-02-07 – 2018-02-10 (×3): 1 [IU] via SUBCUTANEOUS
  Administered 2018-02-12: 12:00:00 2 [IU] via SUBCUTANEOUS
  Administered 2018-02-14 – 2018-02-15 (×2): 1 [IU] via SUBCUTANEOUS
  Administered 2018-02-15: 18:00:00 2 [IU] via SUBCUTANEOUS

## 2018-02-03 MED ORDER — SODIUM CHLORIDE 0.9 % IV BOLUS
1000.0000 mL | Freq: Once | INTRAVENOUS | Status: AC
Start: 2018-02-03 — End: 2018-02-03
  Administered 2018-02-03: 1000 mL via INTRAVENOUS

## 2018-02-03 MED ORDER — GLIMEPIRIDE 2 MG PO TABS
1.0000 mg | ORAL_TABLET | Freq: Every morning | ORAL | Status: DC
Start: 2018-02-04 — End: 2018-02-12
  Administered 2018-02-04 – 2018-02-11 (×8): 1 mg via ORAL
  Filled 2018-02-03 (×9): qty 1

## 2018-02-03 MED ORDER — AMLODIPINE BESYLATE 5 MG PO TABS
5.0000 mg | ORAL_TABLET | Freq: Every morning | ORAL | Status: DC
Start: 2018-02-04 — End: 2018-02-12
  Administered 2018-02-04 – 2018-02-11 (×8): 5 mg via ORAL
  Filled 2018-02-03 (×9): qty 1

## 2018-02-03 MED ORDER — ONDANSETRON HCL 4 MG/2ML IJ SOLN
4.0000 mg | Freq: Once | INTRAMUSCULAR | Status: AC
Start: 2018-02-03 — End: 2018-02-03
  Administered 2018-02-03: 4 mg via INTRAVENOUS

## 2018-02-03 MED ORDER — ONDANSETRON 4 MG PO TBDP
ORAL_TABLET | ORAL | Status: AC
Start: 2018-02-03 — End: ?
  Filled 2018-02-03: qty 1

## 2018-02-03 MED ORDER — SODIUM BICARBONATE 650 MG PO TABS
650.0000 mg | ORAL_TABLET | Freq: Two times a day (BID) | ORAL | Status: DC
Start: 2018-02-03 — End: 2018-02-14
  Administered 2018-02-03 – 2018-02-14 (×22): 650 mg via ORAL
  Filled 2018-02-03 (×23): qty 1

## 2018-02-03 MED ORDER — ONDANSETRON 4 MG PO TBDP
4.0000 mg | ORAL_TABLET | Freq: Once | ORAL | Status: AC
Start: 2018-02-03 — End: 2018-02-03
  Administered 2018-02-03: 4 mg via ORAL

## 2018-02-03 MED ORDER — SODIUM CHLORIDE (PF) 0.9 % IJ SOLN
0.40 mg | INTRAMUSCULAR | Status: DC | PRN
Start: 2018-02-03 — End: 2018-02-21

## 2018-02-03 MED ORDER — HYDROCHLOROTHIAZIDE 12.5 MG PO TABS
12.5000 mg | ORAL_TABLET | Freq: Every day | ORAL | Status: DC
Start: 2018-02-04 — End: 2018-02-07
  Administered 2018-02-04 – 2018-02-07 (×4): 12.5 mg via ORAL
  Filled 2018-02-03 (×4): qty 1

## 2018-02-03 MED ORDER — HYDRALAZINE HCL 20 MG/ML IJ SOLN
10.00 mg | Freq: Four times a day (QID) | INTRAMUSCULAR | Status: DC | PRN
Start: 2018-02-03 — End: 2018-02-16
  Administered 2018-02-06 – 2018-02-12 (×5): 10 mg via INTRAVENOUS
  Filled 2018-02-03 (×5): qty 1

## 2018-02-03 MED ORDER — BACITRACIN +/- ZINC 500 UNIT/GM EX OINT (WRAP)
TOPICAL_OINTMENT | CUTANEOUS | Status: AC
Start: 2018-02-03 — End: ?
  Filled 2018-02-03: qty 1

## 2018-02-03 MED ORDER — DEXTROSE 10 % IV BOLUS
125.00 mL | INTRAVENOUS | Status: DC | PRN
Start: 2018-02-03 — End: 2018-02-16

## 2018-02-03 MED ORDER — FINASTERIDE 5 MG PO TABS
5.0000 mg | ORAL_TABLET | Freq: Every morning | ORAL | Status: DC
Start: 2018-02-04 — End: 2018-02-21
  Administered 2018-02-04 – 2018-02-21 (×18): 5 mg via ORAL
  Filled 2018-02-03 (×18): qty 1

## 2018-02-03 MED ORDER — LISINOPRIL 10 MG PO TABS
20.0000 mg | ORAL_TABLET | Freq: Every day | ORAL | Status: DC
Start: 2018-02-04 — End: 2018-02-07
  Administered 2018-02-04 – 2018-02-07 (×4): 20 mg via ORAL
  Filled 2018-02-03 (×4): qty 2

## 2018-02-03 MED ORDER — GLUCAGON 1 MG IJ SOLR (WRAP)
1.00 mg | INTRAMUSCULAR | Status: DC | PRN
Start: 2018-02-03 — End: 2018-02-16

## 2018-02-03 MED ORDER — INSULIN LISPRO (1 UNIT DIAL) 100 UNIT/ML SC SOPN
1.00 [IU] | PEN_INJECTOR | Freq: Every evening | SUBCUTANEOUS | Status: DC
Start: 2018-02-03 — End: 2018-02-15
  Administered 2018-02-03 – 2018-02-11 (×9): 1 [IU] via SUBCUTANEOUS
  Administered 2018-02-12: 22:00:00 2 [IU] via SUBCUTANEOUS
  Administered 2018-02-14: 22:00:00 1 [IU] via SUBCUTANEOUS
  Filled 2018-02-03 (×2): qty 3

## 2018-02-03 MED ORDER — SODIUM CHLORIDE (PF) 0.9 % IJ SOLN
3.00 mL | Freq: Three times a day (TID) | INTRAMUSCULAR | Status: DC
Start: 2018-02-03 — End: 2018-02-25
  Administered 2018-02-04 – 2018-02-25 (×37): 3 mL via INTRAVENOUS

## 2018-02-03 MED ORDER — VITAMIN D 25 MCG (1000 UT) PO TABS
25.0000 ug | ORAL_TABLET | Freq: Every morning | ORAL | Status: DC
Start: 2018-02-04 — End: 2018-02-14
  Administered 2018-02-04 – 2018-02-14 (×11): 25 ug via ORAL
  Filled 2018-02-03 (×11): qty 1

## 2018-02-03 MED ORDER — SIMVASTATIN 10 MG PO TABS
20.0000 mg | ORAL_TABLET | Freq: Every evening | ORAL | Status: DC
Start: 2018-02-03 — End: 2018-02-14
  Administered 2018-02-03 – 2018-02-13 (×11): 20 mg via ORAL
  Filled 2018-02-03 (×12): qty 2

## 2018-02-03 NOTE — ED Notes (Signed)
PT still has urinal but has still been unable to void at this time. PT has received 1L of NS.

## 2018-02-03 NOTE — Consults (Addendum)
[place]    Name:  DELRICK, DEHART    MR#:    [id number]   DOB:  [birth year]     Date/Time: 01/19/2018 2:10 PM Patient Location: [facility]  Attending:  Wynelle Cleveland, MD     For questions contact Dr. Robyne Peers (pager [phone]) via [employer] or pager.  After hours and on weekends, please contact the neurosurgeon on call.    I have reviewed the medical record including pertinent notes, laboratory results, and radiographic studies.   I have discussed my assessment and recommendations with the requesting care provider.  Please see below for details.   ______________________________________________________________________     Neurosurgical Diagnosis:  SDH (subdural hematoma)      Assessment/Plan:  78 year old man with a history of dementia presents to the ER after an unwitnessed fall at home.  CT head read as showing a tiny, left frontal SDH measuring 1 mm in thichness.  (I do not appreciate any SDH on review of the imaging).      This patient has suffered a traumatic brain injury.  Surgery is not indicated at this time.  Admission is most likely also not needed.  A repeat CT in 4 - 6 hours can be considered but is not necessary before they will be cleared to be discharged.  If the patient has a GCS of 15, they can be discharged without repeat CT.  If the GCS is 13-14 a repeat CT can be done in 4 - 6 hours and the patient can be discharged if stable.        Plan (if admitted)  1. Q 4 hour neurochecks  2. Repeat CT of the brain without contrast in 4-6 hours or PRN decline in neurologic exam not caused by pharmacologic sedatives  3. If the patient experiences a decline in their neurologic exam not caused by pharmacologic sedatives, a stat head CT without contrast will be done and neurosurgery notified when it is completed  4. No antiepileptic drug (AED) prophylaxis is necessary.  5. Elevate HOB 30 degrees  6. Mobilize as able  7. If the repeat CT in 4 - 6  hours is stable, the patient can be discharged to home with concussion instructions.  There is no need for neurosurgical follow up.  8. May start pharmacologic DVT prophylaxis (if needed) in 24 hours if the follow up CT is stable.  If it is not stable, hold DVT prophylaxis until a subsequent CT is stable.             Active Problems:  Active Hospital Problems    Diagnosis    Fall (on) (from) other stairs and steps, initial encounter    SDH (subdural hematoma)    Essential hypertension    Type 2 diabetes mellitus with stage 3 chronic kidney disease, without long-term current use of insulin    Nonverbal     ______________________________________________________________________     Clinical Summary:  Non-verbal patient with history of dementia and strokes presents to the emergency department after a fall that was unwitnessed in his garage when he apparent fell down 2 wooden steps striking his head at approximately 1100 today in his home.  Patient lives with his niece who is his primary caregiver.  Patient normally ambulates with a cane.  Patient has been doing well recently otherwise.  There was blood on the steps where he apparently struck his head and he  was awake and conscious on the caregivers arrival.      Past Medical/Surgical History:  Past Medical History:   Diagnosis Date    Cataracts, bilateral     Diabetes mellitus     Hypertension     Kidney disorder     stage IV kidney disease    Stroke       Past Surgical History:   Procedure Laterality Date    EYE SURGERY      LUNG LOBECTOMY      right eye transplant      unsuccessful         Home Medications:  Prior to Admission medications    Medication Sig Start Date End Date Taking? Authorizing Provider   amLODIPine (NORVASC) 5 MG tablet Take 1 tablet (5 mg total) by mouth daily 01/17/18  Yes Dressler, Clarita Crane, MD   clopidogrel (PLAVIX) 75 mg tablet Take 1 tablet (75 mg total) by mouth daily 11/29/17  Yes Elijio Miles, Leary Roca, PA   finasteride (PROSCAR)  5 MG tablet Take 1 tablet (5 mg total) by mouth every morning 01/17/18  Yes Dressler, Clarita Crane, MD   glimepiride (AMARYL) 1 MG tablet Take 1 tablet (1 mg total) by mouth every morning before breakfast 01/17/18  Yes Dressler, Clarita Crane, MD   lisinopril-hydroCHLOROthiazide (PRINZIDE,ZESTORETIC) 20-12.5 MG per tablet Take 1 tablet by mouth daily   Yes [provider]   simvastatin (ZOCOR) 40 MG tablet Take 0.5 tablets (20 mg total) by mouth nightly Take 1/2 tablet (20mg ) every night at bedtine 01/17/18  Yes Dressler, Clarita Crane, MD   sodium bicarbonate 650 MG tablet Take 1 tablet (650 mg total) by mouth 2 (two) times daily Taking 10 grain 2 times a day 01/17/18  Yes Dressler, Clarita Crane, MD   atropine 1 % ophthalmic solution Place 1 drop into the right eye 4 (four) times daily 01/17/18   Dressler, Clarita Crane, MD   Multiple Vitamins-Minerals (CENTRUM ADULTS) Tab Take 1 tablet by mouth every morning       [provider]   Omega-3 Fatty Acids (FISH OIL) 1000 MG Cap capsule Take 1 capsule by mouth every morning    [provider]   vitamin D (CHOLECALCIFEROL) 25 MCG (1000 UT) tablet Take 25 mcg by mouth every morning       [provider]        Radiological Studies:  Ct Head Wo- (rad Read)    Result Date: 02/10/2018  1.  Possible tiny acute subdural hematoma on the left, without mass effect: Dr. Norman Clay was informed of this by telephone at 1354 hours on 03 February 2018. 2.  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 3.  Other chronic intracranial changes as above. 4.  Frontal scalp contusion. ReadingStation:[id number]    Ct Cervical Spine Wo    Result Date: 02/02/2018  No fractures are identified. Multilevel degenerative facet disease. There is at least mild spinal stenosis at C3-4 and C4-5, and moderate spinal stenosis at C5-6. Some food or a mass is visible in the upper thoracic esophagus,  filling the lumen. ReadingStation:[email or web address]    Xr Chest Ap Portable    Result Date: 01/27/2018  No acute findings. ReadingStation:[id number]       Laboratory Results:  Results     Procedure Component Value Units Date/Time    Prothrombin time/INR [[id number]]  (Abnormal) Collected:  02/02/2018 1300    Specimen:  Blood Updated:  02/01/2018  1343     PT 12.3 sec      PT INR 1.2    Creatine Kinase (CK) [[id number]]  (Abnormal) Collected:  01/15/2018 1300    Specimen:  Plasma Updated:  01/12/2018 1334     Creatine Kinase (CK) 375 U/L     CBC and differential [[id number]]  (Abnormal) Collected:  01/26/2018 1300    Specimen:  Blood Updated:  01/13/2018 1328     WBC 15.4 K/cmm      RBC 4.31 M/cmm      Hemoglobin 12.9 gm/dL      Hematocrit 41.3 %      MCV 96 fL      MCH 30 pg      MCHC 31 gm/dL      RDW 11.7 %      PLT CT 202 K/cmm      MPV 8.4 fL      NEUTROPHIL % 84.3 %      Lymphocytes 10.8 %      Monocytes 4.5 %      Eosinophils % 0.2 %      Basophils % 0.3 %      Neutrophils Absolute 13.0 K/cmm      Lymphocytes Absolute 1.7 K/cmm      Monocytes Absolute 0.7 K/cmm      Eosinophils Absolute 0.0 K/cmm      BASO Absolute 0.0 K/cmm     i-Stat Troponin [[id number]] Collected:  01/24/2018 1311    Specimen:  Blood Updated:  01/22/2018 1324     Trop I, ISTAT 0.05 ng/mL     i-Stat Chem 8 CartrIDge [[id number]]  (Abnormal) Collected:  01/20/2018 1313    Specimen:  Blood Updated:  02/01/2018 1316     i-STAT Sodium 135 mMol/L      i-STAT Potassium 5.0 mMol/L      i-STAT Chloride 105 mMol/L      TCO2, ISTAT 20 mMol/L      Ionized Ca, ISTAT 4.30 mg/dL      i-STAT Glucose 346 mg/dL      i-STAT Creatinine 2.40 mg/dL      i-STAT BUN 24 mg/dL      Anion Gap, ISTAT 16.0     EGFR 29 mL/min/1.79m2      i-STAT Hematocrit 39.0 %      i-STAT Hemoglobin 13.3 gm/dL     I-Stat Troponin Notification [[id number]] Collected:  02/02/2018 1258    Specimen:  ISTAT Updated:  02/10/2018 1311     I-STAT Notification Istat Notification    I-Stat Chem 8 Notification [[id number]] Collected:   01/29/2018 1258    Specimen:  ISTAT Updated:  01/20/2018 1311     I-STAT Notification Istat Notification          ______________________________________________________________________    Billing: Interprofessional telephone assessment and management services:               [phone]: 11-20 minutes     Signed by: Robyne Peers, MD  Neurosurgery

## 2018-02-03 NOTE — H&P (Signed)
Medicine History & Physical   [facility]   Patient Name: [name], [name] LOS: 0 days   Attending Physician: Salome Arnt, MD PCP: Melvyn Neth, MD      Assessment and Plan:                                                                Possible tiny acute subdural hematoma on the left, without mass effect  Due to accidental fall  No neuro deficit  Neurosurgery consulted recommend repeat a CT scan if CT scan no increase of hematoma patient can be discharged  Control blood pressure  Hold Plavix and an aspirin      Scalp laceration due to accidental fall  S/P suture  CT of the neck neck did not show to fracture or dislocation      Esophageal foreign body possible food or mass  This is detected on the CT of the neck  Will order CT of chest without contrast      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  Hold aspirin and Plavix due to hematoma, continue statin    Stage 4 CKD  Creatinine at baseline  Avoid unnecessary nephrotoxic medication  Recent renal ultrasound showed medical renal disease      Hypertension   Continue home BP medications  IV hydralazine for blood pressure greater than 160      Diabetes mellitus  hold oral hypoglycemics  Add sliding scale insulin      Single-vessel CAD continue aspirin statin       Acute Bilateral PE  No acute pulmonary issue  Hold Eliquis        DVT PPx: Contraindicated because of hematoma    Dispo: Observation    Healthcare Proxy: Family    Code: Full code         History of Presenting Illness                                CC: Accidental fall with a skin laceration and possible tiny subdural hematoma    [name] is a 78 y.o. male Ms. past medical history old stroke, CAD, diabetes mellitus, hypertension,stage 4 CKD and dementia who came in for extended for and skin laceration.  Patient has dementia patient cannot provide any medical information history obtained from the ER  physician and the chart.  Patient was found to have possible tiny subdural hematoma.  Seen by neurosurgical service, recommend observation overnight and repeat a CT scan of the head if there is no hematoma patient can be discharged.  And hold on Plavix.  The scalp laceration been sutured.        Past Medical History:   Diagnosis Date    Cataracts, bilateral     Diabetes mellitus     Hypertension     Kidney disorder     stage IV kidney disease    Stroke      Past Surgical History:   Procedure Laterality Date    EYE SURGERY      LUNG LOBECTOMY      right eye transplant      unsuccessful  Family History   Problem Relation Age of Onset    Heart attack Mother     Heart attack Father     Heart attack Sister     Cancer Brother     Heart attack Maternal Aunt     Heart attack Maternal Uncle     No known problems Paternal Aunt     No known problems Paternal Uncle     No known problems Maternal Grandmother     No known problems Maternal Grandfather     No known problems Paternal Grandmother     No known problems Paternal Grandfather      Social History     Tobacco Use    Smoking status: Never Smoker    Smokeless tobacco: Never Used   Substance Use Topics    Alcohol use: Never     Frequency: Never    Drug use: Never       Subjective   Review of Systems:  Unable to obtain due to dementia        Objective   Physical Exam:     Vitals: T:97.1 F (36.2 C) (Oral),  BP:122/58, HR:(!) 106, RR:(!) 29, SaO2:97%    1) General Appearance:  In no acute distress.   2) Eyes: Pink conjunctiva, anicteric sclera.  Right-sided cataract.  3) ENT: Frontal scalp laceration S/P suture and a dressing . oral mucosa moist with no pharyngeal congestion, erythema or swelling.  4) Neck: Supple, with full range of motion. Trachea is central, no JVD noted  5) Chest: Clear to auscultation bilaterally, no wheezes or rhonchi.  6) CVS: normal rate and regular rhythm, with no murmurs.  7) Abdomen: Soft, no palpable mass. Bowel sounds  normal. No CVA tenderness  8) Extremities: No pitting edema, pulses palpable, no calf swelling.   9) Skin: Warm, dry with normal skin turgor, no rash  10) Lymphatics: No lymphadenopathy in axillary, cervical and inguinal area.   11) Neurological: Cranial nerves II-XII intact.  Right arm contracture status  12) Psychiatric: Calm not agitated    EKG normal sinus rhythm no ST-T change  Chest Xray: Per my review shows no acute change    Patient Vitals for the past 12 hrs:   BP Temp Pulse Resp   02/02/2018 1731 122/58  (!) 106 (!) 29   02/01/2018 1702 121/66   (!) 29   01/15/2018 1601 123/67  (!) 102    01/24/2018 1532 149/79   14   01/21/2018 1502 147/84      02/01/2018 1444   99 22   01/27/2018 1431 160/77  (!) 105 17   01/22/2018 1401 (!) 137/38  (!) 105 (!) 32   01/23/2018 1351 147/66  (!) 103 (!) 42   01/31/2018 1342  97.1 F (36.2 C)     02/10/2018 1302 148/81  97 (!) 24   01/26/2018 1301 179/84  (!) 101 19   01/23/2018 1257 179/84  (!) 103 13     Weight Monitoring 11/28/2017 12/29/2017 12/29/2017 12/31/2017 01/01/2018 01/17/2018   Height 167.6 cm - 167.6 cm - - 177.8 cm   Weight 66.679 kg 64 kg 64 kg 63.2 kg 62.1 kg 64.91 kg   Weight Method - Actual - Standing Scale Standing Scale -   BMI (calculated) 23.8 kg/m2 - 22.8 kg/m2 - - 20.6 kg/m2          Recent Results (from the past 24 hour(s))   ECG 12 lead (Specify reason for exam)    Collection  Time: 02/09/2018 12:57 PM   Result Value Ref Range    Patient Age 78 years    Patient DOB 1940/03/27     Patient Height      Patient Weight      Interpretation Text       Sinus rhythm  Nonspecific intraventricular conduction delay  Compared to ECG 12/29/2017 14:48:41  Intraventricular conduction delay now present  Atrial premature complex(es) no longer present      Physician Interpreter      Ventricular Rate 97 //min    QRS Duration 170 ms    P-R Interval 185 ms    Q-T Interval 335 ms    Q-T Interval(Corrected) 426 ms    P Wave Axis 74 deg    QRS Axis 2 deg    T Axis 59 years    I-Stat Chem 8 Notification    Collection Time: 02/04/2018 12:58 PM   Result Value Ref Range    I-STAT Notification Istat Notification    I-Stat Troponin Notification    Collection Time: 01/28/2018 12:58 PM   Result Value Ref Range    I-STAT Notification Istat Notification    CBC and differential    Collection Time: 02/02/2018  1:00 PM   Result Value Ref Range    WBC 15.4 (H) 4.0 - 11.0 K/cmm    RBC 4.31 4.00 - 5.70 M/cmm    Hemoglobin 12.9 (L) 13.0 - 17.5 gm/dL    Hematocrit 41.3 39.0 - 52.5 %    MCV 96 80 - 100 fL    MCH 30 28 - 35 pg    MCHC 31 (L) 32 - 36 gm/dL    RDW 11.7 11.0 - 14.0 %    PLT CT 202 130 - 440 K/cmm    MPV 8.4 6.0 - 10.0 fL    NEUTROPHIL % 84.3 (H) 42.0 - 78.0 %    Lymphocytes 10.8 (L) 15.0 - 46.0 %    Monocytes 4.5 3.0 - 15.0 %    Eosinophils % 0.2 0.0 - 7.0 %    Basophils % 0.3 0.0 - 3.0 %    Neutrophils Absolute 13.0 (H) 1.7 - 8.6 K/cmm    Lymphocytes Absolute 1.7 0.6 - 5.1 K/cmm    Monocytes Absolute 0.7 0.1 - 1.7 K/cmm    Eosinophils Absolute 0.0 0.0 - 0.8 K/cmm    BASO Absolute 0.0 0.0 - 0.3 K/cmm   Creatine Kinase (CK)    Collection Time: 01/24/2018  1:00 PM   Result Value Ref Range    Creatine Kinase (CK) 375 (H) 30 - 230 U/L   Prothrombin time/INR    Collection Time: 01/23/2018  1:00 PM   Result Value Ref Range    PT 12.3 (H) 9.5 - 11.5 sec    PT INR 1.2 0.5 - 1.3   i-Stat Troponin    Collection Time: 01/18/2018  1:11 PM   Result Value Ref Range    Trop I, ISTAT 0.05 0.00 - 0.08 ng/mL   i-Stat Chem 8 CartrIDge    Collection Time: 01/31/2018  1:13 PM   Result Value Ref Range    i-STAT Sodium 135 (L) 136 - 147 mMol/L    i-STAT Potassium 5.0 3.5 - 5.3 mMol/L    i-STAT Chloride 105 98 - 110 mMol/L    TCO2, ISTAT 20 (L) 24 - 29 mMol/L    Ionized Ca, ISTAT 4.30 (L) 4.35 - 5.10 mg/dL    i-STAT Glucose 346 (H) 71 - 99 mg/dL  i-STAT Creatinine 2.40 (H) 0.80 - 1.30 mg/dL    i-STAT BUN 24 (H) 7 - 22 mg/dL    Anion Gap, ISTAT 16.0 7.0 - 16.0    EGFR 29 (L) 60 - 150 mL/min/1.60m2    i-STAT Hematocrit 39.0 39.0 -  52.5 %    i-STAT Hemoglobin 13.3 13.0 - 17.5 gm/dL   Urinalysis w Microscopic and Culture if Indicated    Collection Time: 01/23/2018  6:39 PM   Result Value Ref Range    Color, UA Yellow Colorless,Yellow,Straw    Clarity, UA Slightly Cloudy (A) Clear    Specific Gravity, UR 1.011 1.001 - 1.040    pH, Urine 7.0 5.0 - 8.0 pH    Protein, UR Negative Negative mg/dL    Glucose, UA >=500 (A) Negative mg/dL    Ketones UA 5 Negative,5 mg/dL    Bilirubin, UA Negative Negative    Blood, UA Negative Negative    Nitrite, UA Negative Negative    Urobilinogen, UA Normal Normal mg/dL    Leukocyte Esterase, UA Negative Negative Leu/uL    UR Micro Performed     RBC, UA 1 0 - 4 /hpf        No Known Allergies   Ct Head Wo- (rad Read)    Result Date: 01/13/2018  1.  Possible tiny acute subdural hematoma on the left, without mass effect: Dr. Norman Clay was informed of this by telephone at 1354 hours on 03 February 2018. 2.  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 3.  Other chronic intracranial changes as above. 4.  Frontal scalp contusion. ReadingStation:[email or web address]    Ct Cervical Spine Wo    Result Date: 01/27/2018  No fractures are identified. Multilevel degenerative facet disease. There is at least mild spinal stenosis at C3-4 and C4-5, and moderate spinal stenosis at C5-6. Some food or a mass is visible in the upper thoracic esophagus, filling the lumen. ReadingStation:[email or web address]    Xr Chest Ap Portable    Result Date: 02/08/2018  No acute findings. [place] Medications     Med List Status:  Pharmacy Completed Set By: Jeb Levering, PharmD at 01/25/2018  3:05 PM                amLODIPine (NORVASC) 5 MG tablet     Take 5 mg by mouth every morning     clopidogrel (PLAVIX) 75 mg tablet     Take 75 mg by mouth every morning     finasteride (PROSCAR) 5 MG tablet     Take 1 tablet (5 mg total) by mouth every morning     glimepiride  (AMARYL) 1 MG tablet     Take 1 tablet (1 mg total) by mouth every morning before breakfast     lisinopril-hydroCHLOROthiazide (PRINZIDE,ZESTORETIC) 20-12.5 MG per tablet     Take 1 tablet by mouth every morning        Multiple Vitamins-Minerals (CENTRUM ADULTS) Tab     Take 1 tablet by mouth every evening        Omega-3 Fatty Acids (FISH OIL) 1000 MG Cap capsule     Take 1 capsule by mouth every morning     simvastatin (ZOCOR) 40 MG tablet     Take 0.5 tablets (20 mg total) by mouth nightly Take 1/2 tablet (20mg ) every night at bedtine     sodium bicarbonate 650 MG tablet     Take 1 tablet (  650 mg total) by mouth 2 (two) times daily Taking 10 grain 2 times a day     vitamin D (CHOLECALCIFEROL) 25 MCG (1000 UT) tablet     Take 25 mcg by mouth every morning           Ongoing Comment    Stottlemyer, Randall Hiss, PharmD    01/20/2018  2:40 PM             Meds given in the ED:  Medications   lisinopril (PRINIVIL,ZESTRIL) tablet 20 mg (has no administration in time range)   hydroCHLOROthiazide (HYDRODIURIL) tablet 12.5 mg (has no administration in time range)   sodium chloride (PF) 0.9 % injection 3 mL (has no administration in time range)   naloxone (NARCAN) injection 0.4 mg (has no administration in time range)   hydrALAZINE (APRESOLINE) injection 10 mg (has no administration in time range)   dextrose (D10W) 10% bolus 125 mL (has no administration in time range)   glucagon (rDNA) (GLUCAGEN) injection 1 mg (has no administration in time range)   insulin lispro (1 Unit Dial) (HumaLOG) injection pen 1-9 Units (has no administration in time range)     And   insulin lispro (1 Unit Dial) (HumaLOG) injection pen 1-7 Units (has no administration in time range)     And   insulin lispro (1 Unit Dial) (HumaLOG) injection pen 1-7 Units (has no administration in time range)   sodium chloride 0.9 % bolus 1,000 mL (0 mLs Intravenous Stopped 02/09/2018 1449)   ondansetron (ZOFRAN) injection 4 mg (4 mg Intravenous Given 01/27/2018 1307)    ondansetron (ZOFRAN-ODT) disintegrating tablet 4 mg (4 mg Oral Given 01/16/2018 1301)      Time Spent:     Salome Arnt, MD     01/22/2018,7:15 PM   MRN: [id number]                                      CSN: [id number] DOB: March 06, 1940

## 2018-02-03 NOTE — ED Triage Notes (Signed)
PT reports by EMS. PT reported to have been found in garage with legs under a car by caregiver. PT bleeding from head with hematoma, apparent vomiting episode. PT is non verbal. Previous stroke to right side of body. PT aware and alert.

## 2018-02-03 NOTE — ED Provider Notes (Signed)
Physician/Midlevel provider first contact with patient: 01/11/2018 1248         History     Chief Complaint   Patient presents with    Fall     NonVerbal patient with history of dementia and CVA presents to the emergency department after a fall that was unwitnessed in his garage when he apparently fell down 2 wooden steps striking his head at approximately 1100 today in his home.  Patient lives with his niece who is his primary caregiver.  Patient normally ambulates with a cane.  Patient has been doing well recently otherwise.  There was blood on the steps where he apparently struck his head and he was awake and conscious on the caregivers arrival.  Patient has had no recent symptoms including chest pain, shortness of breath, fever, cough, nausea, vomiting, bloody or black stools, abdominal pain, headaches, urinary symptoms.  His weight has been stable and his appetite has been normal.  There have been no recent medication changes.  No new symptoms of weakness or numbness, visual gait or speech changes    The history is provided by the patient. 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   Fall   The accident occurred less than 1 hour ago. The fall occurred in unknown circumstances. He fell from an unknown height. Impact surface: uncertain  The volume of blood lost was minimal. The point of impact was the head. Pain location: uncertain  He was ambulatory at the scene. There was no entrapment after the fall. There was no drug use involved in the accident. There was no alcohol use involved in the accident. Associated symptoms include nausea and vomiting. Pertinent negatives include no fever, no numbness, no abdominal pain, no headaches, no hearing loss, no loss of consciousness and no tingling. Treatment on scene includes a c-collar and a backboard. He has tried nothing for the symptoms. The treatment provided no relief.            Past Medical History:   Diagnosis Date    Cataracts, bilateral      Diabetes mellitus     Hypertension     Kidney disorder     stage IV kidney disease    Stroke        Past Surgical History:   Procedure Laterality Date    EYE SURGERY      LUNG LOBECTOMY      right eye transplant      unsuccessful       Family History   Problem Relation Age of Onset    Heart attack Mother     Heart attack Father     Heart attack Sister     Cancer Brother     Heart attack Maternal Aunt     Heart attack Maternal Uncle     No known problems Paternal Aunt     No known problems Paternal Uncle     No known problems Maternal Grandmother     No known problems Maternal Grandfather     No known problems Paternal Grandmother     No known problems Paternal Grandfather        Social  Social History     Tobacco Use    Smoking status: Never Smoker    Smokeless tobacco: Never Used   Substance Use Topics    Alcohol use: Never     Frequency: Never    Drug use: Never       .  No Known Allergies    Home Medications     Med List Status:  Pharmacy Completed Set By: Jeb Levering, PharmD at 01/26/2018  3:05 PM                amLODIPine (NORVASC) 5 MG tablet     Take 5 mg by mouth every morning     clopidogrel (PLAVIX) 75 mg tablet     Take 75 mg by mouth every morning     finasteride (PROSCAR) 5 MG tablet     Take 1 tablet (5 mg total) by mouth every morning     glimepiride (AMARYL) 1 MG tablet     Take 1 tablet (1 mg total) by mouth every morning before breakfast     lisinopril-hydroCHLOROthiazide (PRINZIDE,ZESTORETIC) 20-12.5 MG per tablet     Take 1 tablet by mouth every morning        Multiple Vitamins-Minerals (CENTRUM ADULTS) Tab     Take 1 tablet by mouth every evening        Omega-3 Fatty Acids (FISH OIL) 1000 MG Cap capsule     Take 1 capsule by mouth every morning     simvastatin (ZOCOR) 40 MG tablet     Take 0.5 tablets (20 mg total) by mouth nightly Take 1/2 tablet (20mg ) every night at bedtine     sodium bicarbonate 650 MG tablet     Take 1 tablet (650 mg total) by mouth 2 (two)  times daily Taking 10 grain 2 times a day     vitamin D (CHOLECALCIFEROL) 25 MCG (1000 UT) tablet     Take 25 mcg by mouth every morning           Ongoing Comment    Jeb Levering, PharmD    02/05/2018  2:40 PM               Review of Systems   Constitutional: Negative for fever.   HENT: Negative for congestion, ear pain, hearing loss, rhinorrhea, sore throat and tinnitus.    Eyes: Negative for discharge and redness.   Respiratory: Negative for cough, chest tightness and shortness of breath.    Cardiovascular: Negative for chest pain and leg swelling.   Gastrointestinal: Positive for nausea and vomiting. Negative for abdominal pain, blood in stool, constipation and diarrhea.   Genitourinary: Negative for decreased urine volume, dysuria, flank pain, frequency, penile swelling, scrotal swelling, testicular pain and urgency.   Musculoskeletal: Negative for joint swelling, myalgias and neck stiffness.   Skin: Negative for rash.   Neurological: Negative for dizziness, tingling, seizures, loss of consciousness, weakness, numbness and headaches.   Hematological: Negative for adenopathy. Does not bruise/bleed easily.   Psychiatric/Behavioral: Negative for suicidal ideas.       Physical Exam    BP: 179/84, Heart Rate: (!) 103, Temp: 97.1 F (36.2 C), Resp Rate: 13, SpO2: 94 %    Physical Exam  Vitals signs and nursing note reviewed.   Constitutional:       General: He is not in acute distress.     Appearance: He is well-developed. He is not diaphoretic.   HENT:      Head: Normocephalic and atraumatic.      Right Ear: External ear normal.      Left Ear: External ear normal.      Nose: Nose normal.      Mouth/Throat:      Pharynx: No oropharyngeal exudate.   Eyes:      General: No scleral  icterus.        Right eye: No discharge.         Left eye: No discharge.      Conjunctiva/sclera: Conjunctivae normal.      Pupils: Pupils are equal, round, and reactive to light.   Neck:      Musculoskeletal: Normal range of motion and  neck supple.      Thyroid: No thyroid mass or thyromegaly.      Vascular: No JVD.      Trachea: No tracheal deviation.   Cardiovascular:      Rate and Rhythm: Normal rate and regular rhythm.      Heart sounds: Normal heart sounds. No murmur. No friction rub. No gallop.    Pulmonary:      Effort: Pulmonary effort is normal. No tachypnea, accessory muscle usage or respiratory distress.      Breath sounds: Normal breath sounds. No stridor. No wheezing or rales.   Chest:      Chest wall: No tenderness.   Abdominal:      General: Bowel sounds are normal. There is no distension.      Palpations: Abdomen is soft. There is no mass.      Tenderness: There is no abdominal tenderness. There is no guarding or rebound.   Musculoskeletal: Normal range of motion.         General: No tenderness.   Lymphadenopathy:      Cervical: No cervical adenopathy.   Skin:     General: Skin is warm and dry.      Coloration: Skin is not pale.      Findings: Abrasion present. No ecchymosis, erythema, lesion, petechiae or rash.      Nails: There is no clubbing.            Neurological:      Mental Status: He is alert and oriented to person, place, and time.      GCS: GCS eye subscore is 4. GCS verbal subscore is 2. GCS motor subscore is 6.      Cranial Nerves: Cranial nerves are intact. No cranial nerve deficit.      Motor: Motor function is intact. No abnormal muscle tone.      Coordination: Coordination normal.   Psychiatric:         Behavior: Behavior normal.           MDM and ED Course     ED Medication Orders (From admission, onward)    Start Ordered     Status Ordering Provider    02/04/2018 1301 01/20/2018 1300  ondansetron (ZOFRAN-ODT) disintegrating tablet 4 mg  Once in ED     Route: Oral  Ordered Dose: 4 mg     Last MAR action:  Given Wynelle Cleveland    01/11/2018 1259 02/02/2018 1258  sodium chloride 0.9 % bolus 1,000 mL  Once in ED     Route: Intravenous  Ordered Dose: 1,000 mL     Last MAR action:  Stopped Sheila Oats R    01/26/2018  1259 01/24/2018 1258  ondansetron (ZOFRAN) injection 4 mg  Once in ED     Route: Intravenous  Ordered Dose: 4 mg     Last MAR action:  Given Sheila Oats R             MDM  Number of Diagnoses or Management Options  Dementia without behavioral disturbance, unspecified dementia type: established and worsening  Fall at home, initial encounter: new and requires  workup  History of CVA (cerebrovascular accident): established and improving  Leukocytosis, unspecified type: new and requires workup  Renal insufficiency: established and worsening  Scalp laceration, initial encounter: new and requires workup  Traumatic subdural hematoma without loss of consciousness, initial encounter: new and requires workup  Type 2 diabetes mellitus with hyperglycemia, unspecified whether long term insulin use:   Diagnosis management comments: This patient presents to the Emergency Department following a serious fall or traumatic event.   Evaluation and treatment has been initiated in the ER in conjunction with the surgical specialist, but the patient has not had significant improvement in symptoms, has sustained a serious injury and/or has finding and/or co-morbidities that make admission for observation/stabilization and possible surgical intervention the most appropriate disposition. The patient may also be at risk for possible delayed serious injury. Differential diagnosis has included but is not limited to significant closed head injury, organ injury, fracture, abrasion, contusion, spinal injury, and laceration.  Diagnostic impression and plan were discussed and agreed upon with the patient and/or family.  Results of lab/radiology tests were reviewed and discussed with the patient and/or family. Questions were answered and concerns addressed.  Appropriate surgical consultation was made for admission and further treatment of this patient.           Amount and/or Complexity of Data Reviewed  Clinical lab tests: ordered and reviewed   Tests in the radiology section of CPT: ordered and reviewed  Tests in the medicine section of CPT: ordered and reviewed  Decide to obtain previous medical records or to obtain history from someone other than the patient: yes  Obtain history from someone other than the patient: yes  Discuss the patient with other providers: yes  Independent visualization of images, tracings, or specimens: yes    Risk of Complications, Morbidity, and/or Mortality  Presenting problems: moderate  Diagnostic procedures: low  Management options: moderate    Patient Progress  Patient progress: stable      Patient with some vomiting in the emergency department alleviated with Zofran here.  Patient seen and evaluated by trauma surgery.  Case discussed with Dr. Davis Gourd of neurosurgery.  Patient on Plavix, difficult exam to follow for closed head injury.             Procedures    Clinical Impression & Disposition     Clinical Impression  Final diagnoses:   Fall at home, initial encounter   Traumatic subdural hematoma without loss of consciousness, initial encounter   Scalp laceration, initial encounter   Dementia without behavioral disturbance, unspecified dementia type   History of CVA (cerebrovascular accident)   Renal insufficiency   Leukocytosis, unspecified type   Type 2 diabetes mellitus with hyperglycemia, unspecified whether long term insulin use        ED Disposition     ED Disposition Condition Date/Time Comment    Admit  Fri Feb 03, 2018  2:00 PM            New Prescriptions    No medications on file        Treatment Team: Scribe: Lincoln Maxin, MD  01/19/2018 [phone]

## 2018-02-03 NOTE — ED Notes (Signed)
Bed: S25-A  Expected date:   Expected time:   Means of arrival:   Comments:  Ems

## 2018-02-03 NOTE — ED Notes (Signed)
Int found on the floor, pt gown and blanket changed. Primary RN made aware. Vs stable.

## 2018-02-03 NOTE — ED Notes (Signed)
Tried to obtain patients urine sample and patient missed urinal and peed all over self and bed. Changed linens and reiterated trying to pee in the urinal when he needs to pee again

## 2018-02-03 NOTE — Consults (Signed)
Consults   ACCESS TRAUMA HISTORY AND PHYSICAL    Date Time: 02/06/2018 3:05 PM  Patient Name: [name]  Attending Physician: Dr. Lucita Ferrara  Primary Care Physician: Berdine Dance Clarita Crane, MD      Assessment:   The patient has the following active problems:      Patient [facility] Problem List:   SDH (subdural hematoma) (01/28/2018)  Agree with the neurosurgeon, I could not appreciate the hematoma on my review of the scan.   He will be admitted to ED obs and will have a repeat CT. If normal, will discharge home.        Plan:   As above    Samara Deist, MD   [facility] ACCESS Program  - Phone [phone] or Pager [phone]            Trauma Level:   DELTA    Scene Report:    Scene GCS: Eye opening 4 - spontaneous, Verbal Response 5 - alert/oriented, He is at the baseline as he is mute Motor Response 6 - obeys commands, moves spontaneously and purposefully. Total GCS: 15   Transport: BLS,    Transferred from: Scene   Hemodynamically: Stable   C-spine immobilized pre-hospital: Yes. Cleared in ED.    CC:   fall    HPI:   [name] is a 78 y.o. male who presents to the hospital after unwitness fall . He was found down 2 steps. Normally ambulate with a cane.     Review of Systems      (10+):   Pertinent details are included in HPI.    ROS unobtainable from patient due to nonverbal    Allergies:   No Known Allergies    Medication:     Outpatient Medications Marked as Taking for the 01/17/2018 encounter [facility] Encounter)   Medication Sig Dispense Refill    amLODIPine (NORVASC) 5 MG tablet Take 5 mg by mouth every morning      clopidogrel (PLAVIX) 75 mg tablet Take 75 mg by mouth every morning      finasteride (PROSCAR) 5 MG tablet Take 1 tablet (5 mg total) by mouth every morning 30 tablet 2    glimepiride (AMARYL) 1 MG tablet Take 1 tablet (1 mg total) by mouth every morning before breakfast 30 tablet 2    lisinopril-hydroCHLOROthiazide (PRINZIDE,ZESTORETIC) 20-12.5 MG per tablet Take 1 tablet by  mouth every morning         Multiple Vitamins-Minerals (CENTRUM ADULTS) Tab Take 1 tablet by mouth every evening         Omega-3 Fatty Acids (FISH OIL) 1000 MG Cap capsule Take 1 capsule by mouth every morning      simvastatin (ZOCOR) 40 MG tablet Take 0.5 tablets (20 mg total) by mouth nightly Take 1/2 tablet (20mg ) every night at bedtine 30 tablet 1    sodium bicarbonate 650 MG tablet Take 1 tablet (650 mg total) by mouth 2 (two) times daily Taking 10 grain 2 times a day 60 tablet 2    vitamin D (CHOLECALCIFEROL) 25 MCG (1000 UT) tablet Take 25 mcg by mouth every morning         [DISCONTINUED] amLODIPine (NORVASC) 5 MG tablet Take 1 tablet (5 mg total) by mouth daily (Patient taking differently: Take 5 mg by mouth every morning   ) 30 tablet 2    [DISCONTINUED] clopidogrel (PLAVIX) 75 mg tablet Take 1 tablet (75 mg total) by mouth daily (Patient taking differently: Take 75  mg by mouth every morning   ) 30 tablet 2     Histories:     Past Medical History:   Diagnosis Date    Cataracts, bilateral     Diabetes mellitus     Hypertension     Kidney disorder     stage IV kidney disease    Stroke      Past Surgical History:   Procedure Laterality Date    EYE SURGERY      LUNG LOBECTOMY      right eye transplant      unsuccessful     Social History     Socioeconomic History    Marital status: Divorced     Spouse name: Not on file    Number of children: 0    Years of education: Not on file    Highest education level: 12th grade   Occupational History    Not on file   Social Needs    Financial resource strain: Not hard at all    Food insecurity:     Worry: Never true     Inability: Never true    Transportation needs:     Medical: No     Non-medical: No   Tobacco Use    Smoking status: Never Smoker    Smokeless tobacco: Never Used   Substance and Sexual Activity    Alcohol use: Never     Frequency: Never    Drug use: Never    Sexual activity: Not Currently   Lifestyle    Physical activity:      Days per week: 0 days     Minutes per session: 0 min    Stress: Not at all   Relationships    Social connections:     Talks on phone: Never     Gets together: Twice a week     Attends religious service: More than 4 times per year     Active member of club or organization: No     Attends meetings of clubs or organizations: Never     Relationship status: Divorced    Intimate partner violence:     Fear of current or ex partner: No     Emotionally abused: No     Physically abused: No     Forced sexual activity: No   Other Topics Concern    Not on file   Social History Narrative    Not on file     Family history has been reviewed and has no pertinent positives to this evaluation.     Vaccination:   Tetanus up to date: Unknown    Physical Exam      (8+):     Vitals:    01/28/2018 1431   BP: 160/77   Pulse: (!) 105   Resp: 17   Temp:    SpO2: 93%     General: in no apparent distress, appears comfortable, non-toxic, elderly  GUYQI(3-KVQ/+): normocephalic, atraumatic, pupils reactive, EOMI, superficial abrasion at the forehead that is less than 1 cm   Neck(0): supple  Chest: unlabored respirations  Cardiac: regular rate and rhythm  Abdomen: soft, nontender, non-distended  GU/Pelvis(0): normal  Extremities: no tenderness or deformities noted  Neuro: alert, moves all extremities well, no involuntary movements  Skin: normothermic, warm    Labs:   Lab results have been reviewed as follows:  [facility] labs as follows:  Results     Procedure Component Value Units Date/Time    Prothrombin  time/INR [[id number]]  (Abnormal) Collected:  01/30/2018 1300    Specimen:  Blood Updated:  01/15/2018 1343     PT 12.3 sec      PT INR 1.2    Creatine Kinase (CK) [[id number]]  (Abnormal) Collected:  01/15/2018 1300    Specimen:  Plasma Updated:  01/21/2018 1334     Creatine Kinase (CK) 375 U/L     CBC and differential [[id number]]  (Abnormal) Collected:  01/24/2018 1300    Specimen:  Blood Updated:  02/01/2018 1328     WBC 15.4 K/cmm      RBC 4.31 M/cmm       Hemoglobin 12.9 gm/dL      Hematocrit 41.3 %      MCV 96 fL      MCH 30 pg      MCHC 31 gm/dL      RDW 11.7 %      PLT CT 202 K/cmm      MPV 8.4 fL      NEUTROPHIL % 84.3 %      Lymphocytes 10.8 %      Monocytes 4.5 %      Eosinophils % 0.2 %      Basophils % 0.3 %      Neutrophils Absolute 13.0 K/cmm      Lymphocytes Absolute 1.7 K/cmm      Monocytes Absolute 0.7 K/cmm      Eosinophils Absolute 0.0 K/cmm      BASO Absolute 0.0 K/cmm     i-Stat Troponin [[id number]] Collected:  02/06/2018 1311    Specimen:  Blood Updated:  02/08/2018 1324     Trop I, ISTAT 0.05 ng/mL     i-Stat Chem 8 CartrIDge [[id number]]  (Abnormal) Collected:  01/31/2018 1313    Specimen:  Blood Updated:  01/28/2018 1316     i-STAT Sodium 135 mMol/L      i-STAT Potassium 5.0 mMol/L      i-STAT Chloride 105 mMol/L      TCO2, ISTAT 20 mMol/L      Ionized Ca, ISTAT 4.30 mg/dL      i-STAT Glucose 346 mg/dL      i-STAT Creatinine 2.40 mg/dL      i-STAT BUN 24 mg/dL      Anion Gap, ISTAT 16.0     EGFR 29 mL/min/1.32m2      i-STAT Hematocrit 39.0 %      i-STAT Hemoglobin 13.3 gm/dL     I-Stat Troponin Notification [[id number]] Collected:  02/02/2018 1258    Specimen:  ISTAT Updated:  01/14/2018 1311     I-STAT Notification Istat Notification    I-Stat Chem 8 Notification [[id number]] Collected:  02/05/2018 1258    Specimen:  ISTAT Updated:  01/28/2018 1311     I-STAT Notification Istat Notification          Rads:   Ct Head Wo- (rad Read)    Result Date: 01/27/2018  1.  Possible tiny acute subdural hematoma on the left, without mass effect: Dr. Norman Clay was informed of this by telephone at 1354 hours on 03 February 2018. 2.  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 3.  Other chronic intracranial changes as above. 4.  Frontal scalp contusion. ReadingStation:[email or web address]    Ct Cervical Spine Wo    Result Date: 01/27/2018  No fractures are identified. Multilevel degenerative facet disease. There is at  least mild spinal stenosis at C3-4 and C4-5, and moderate spinal  stenosis at C5-6. Some food or a mass is visible in the upper thoracic esophagus, filling the lumen. ReadingStation:[email or web address]    Xr Chest Ap Portable    Result Date: 01/15/2018  No acute findings. ReadingStation:[email or web address]      Outside films: none    Consulting Services:   Neurosurgery - Dr Willette Alma

## 2018-02-03 NOTE — ED Notes (Addendum)
PT is non verbal and patient family was at bedside but left. PT IV was pulled out and line was not able to placed at this time. He has right arm extremity due to previous CVA. PT has small lac on forehead that he continues to pick at and make bleed, have tried to reorient to have him leave it alone multiple times with no success. It is wrapped but continues to move it. PT able to groan to ask for urinal (points to private area).

## 2018-02-03 NOTE — ED Notes (Signed)
PT successful bowel movement on bedpan at this time.

## 2018-02-04 ENCOUNTER — Observation Stay: Payer: Medicare Other

## 2018-02-04 LAB — BASIC METABOLIC PANEL
Anion Gap: 11.9 mMol/L (ref 7.0–18.0)
BUN / Creatinine Ratio: 11.7 Ratio (ref 10.0–30.0)
BUN: 27 mg/dL — ABNORMAL HIGH (ref 7–22)
CO2: 23 mMol/L (ref 20–30)
Calcium: 9.2 mg/dL (ref 8.5–10.5)
Chloride: 105 mMol/L (ref 98–110)
Creatinine: 2.3 mg/dL — ABNORMAL HIGH (ref 0.80–1.30)
EGFR: 31 mL/min/{1.73_m2} — ABNORMAL LOW (ref 60–150)
Glucose: 152 mg/dL — ABNORMAL HIGH (ref 71–99)
Osmolality Calculated: 278 mOsm/kg (ref 275–300)
Potassium: 4.9 mMol/L (ref 3.5–5.3)
Sodium: 135 mMol/L — ABNORMAL LOW (ref 136–147)

## 2018-02-04 LAB — CBC AND DIFFERENTIAL
Basophils %: 0.1 % (ref 0.0–3.0)
Basophils Absolute: 0 10*3/uL (ref 0.0–0.3)
Eosinophils %: 0 % (ref 0.0–7.0)
Eosinophils Absolute: 0 10*3/uL (ref 0.0–0.8)
Hematocrit: 35.5 % — ABNORMAL LOW (ref 39.0–52.5)
Hemoglobin: 11.7 gm/dL — ABNORMAL LOW (ref 13.0–17.5)
Lymphocytes Absolute: 1.2 10*3/uL (ref 0.6–5.1)
Lymphocytes: 8.4 % — ABNORMAL LOW (ref 15.0–46.0)
MCH: 31 pg (ref 28–35)
MCHC: 33 gm/dL (ref 32–36)
MCV: 93 fL (ref 80–100)
MPV: 8.9 fL (ref 6.0–10.0)
Monocytes Absolute: 1.1 10*3/uL (ref 0.1–1.7)
Monocytes: 7.4 % (ref 3.0–15.0)
Neutrophils %: 84.1 % — ABNORMAL HIGH (ref 42.0–78.0)
Neutrophils Absolute: 12.3 10*3/uL — ABNORMAL HIGH (ref 1.7–8.6)
PLT CT: 165 10*3/uL (ref 130–440)
RBC: 3.84 10*6/uL — ABNORMAL LOW (ref 4.00–5.70)
RDW: 11.7 % (ref 11.0–14.0)
WBC: 14.7 10*3/uL — ABNORMAL HIGH (ref 4.0–11.0)

## 2018-02-04 LAB — VH INFLUENZA A/B RAPID TEST
Influenza A: NEGATIVE
Influenza B: NEGATIVE

## 2018-02-04 LAB — VH DEXTROSE STICK GLUCOSE
Glucose POCT: 125 mg/dL — ABNORMAL HIGH (ref 71–99)
Glucose POCT: 206 mg/dL — ABNORMAL HIGH (ref 71–99)
Glucose POCT: 153 mg/dL — ABNORMAL HIGH (ref 71–99)
Glucose POCT: 151 mg/dL — ABNORMAL HIGH (ref 71–99)

## 2018-02-04 MED ORDER — APIXABAN 5 MG PO TABS
5.0000 mg | ORAL_TABLET | Freq: Two times a day (BID) | ORAL | Status: DC
Start: 2018-02-04 — End: 2018-02-14
  Administered 2018-02-04 – 2018-02-14 (×20): 5 mg via ORAL
  Filled 2018-02-04 (×22): qty 1

## 2018-02-04 MED ORDER — CLOPIDOGREL BISULFATE 75 MG PO TABS
75.0000 mg | ORAL_TABLET | Freq: Every morning | ORAL | Status: DC
Start: 2018-02-05 — End: 2018-02-14
  Administered 2018-02-05 – 2018-02-14 (×10): 75 mg via ORAL
  Filled 2018-02-04 (×10): qty 1

## 2018-02-04 MED ORDER — SODIUM CHLORIDE 0.9 % IV SOLN
INTRAVENOUS | Status: DC
Start: 2018-02-04 — End: 2018-02-10

## 2018-02-04 MED ORDER — SODIUM CHLORIDE 0.9 % IV SOLN
INTRAVENOUS | Status: DC
Start: 2018-02-04 — End: 2018-02-04

## 2018-02-04 MED ORDER — VH BIO-K PLUS PROBIOTIC 50 BIL CFU CAPSULE
50.00 | DELAYED_RELEASE_CAPSULE | Freq: Every day | ORAL | Status: DC
Start: 2018-02-04 — End: 2018-02-15
  Administered 2018-02-04 – 2018-02-12 (×8): 50 via ORAL
  Filled 2018-02-04 (×9): qty 1

## 2018-02-04 MED ORDER — PIPERACILLIN-TAZOBACTAM IN DEX 2-0.25 GM/50ML IV SOLN
2.25 g | Freq: Four times a day (QID) | INTRAVENOUS | Status: DC
Start: 2018-02-04 — End: 2018-02-07
  Administered 2018-02-04 – 2018-02-07 (×11): 2.25 g via INTRAVENOUS
  Filled 2018-02-04 (×15): qty 50

## 2018-02-04 NOTE — PT Eval Note (Signed)
ATTENTION Provider(s):  Thank you for allowing [place] to participate in the care of  [facility]  Regulations from the [place] and Medicaid Services ([place]) require your review and approval for this Plan of Care.  Please co-sign this note indicating you are in agreement with the Physical Therapy Plan of Care.    [name]  Patient: [name]     CSN: [id number]    Bed: 4505/4505  Physical Therapy EVALUATION  Visit#: 1   Treatment Frequency: 2-3x/wk  Last seen by a physical therapist vs. Physical therapist assistant: 02/04/2018    PT Assessment:  PATIENT WAS INDEPENDENT IN MOBILITY AND USED A CANE FOR AMB PRIOR TO ADMISSION, PER NIECE'S REPORT TODAY. CURRENTLY REQUIRING MOD A FOR BED MOBILITY. MOD/MAX A X2 FOR TRANSFERS AND AMB WITH HANDHELD ASSIST. PREVIOUS RECORDS FROM 1/7-1/8 INDICATE THAT NIECE WAS REQUESTING NURSING HOME PLACEMENT DUE TO DECLINING FUNCTION, WORSENING DEMENTIA, AND ANGER ISSUES.     DISCHARGE RECOMMENDATIONS   Discharge Recommendations:   LTC       *Discharge recommendations are subject to change based on patient's progress and/or home support changes - please refer to most recent PT note for current recommendation    DME recommended for Discharge:   To be determined closer to discharge    PMP (Progressive Mobility Program) Recommendations:   Recommend patient  transfer to chair/BSC 2-3 times/day with Hand held assist and Gait belt and physical assist and/or supervision of 2 staff as tolerated.     Precautions and Contraindications:   Falls    PT Assessment and Plan of Care (Treatment frequency noted above):     HPI (per physician charting) and Pertinent Medical Details:  Admitted 01/27/2018 with/for "past medical history old stroke, CAD,diabetes mellitus, hypertension,stage 4 CKD and dementia who came in for extended for and skin laceration.  Patient has dementia patient cannot provide any medical information history obtained from the ER physician  and the chart.  Patient was found to have possible tiny subdural hematoma.  Seen by neurosurgical service, recommend observation overnight and repeat a CT scan of the head if there is no hematoma patient can be discharged.  And hold on Plavix.  The scalp laceration been sutured." PER DR WU  H&P  02/04/18    Goals:    LTGs: (By d/c)  1. Patient will perform bed mobility with MIN A in prep for out of bed activity.NEW   2. Patient will perform sit to stand transfers MIN A X2 in prep for gait. NEW   3. Patient will ambulate 10 feet with MOD A X2 in prep for home mobility. NEW       Patient presenting with the following PT Impairments:decreased ROM, decreased strength, decreased safety/judgement during functional mobility, decreased activity tolerance, visual deficit, impaired coordination, decreased functional mobility, decreased balance, gait deficits, impaired cognition    Patient will benefit from skilled PT services in order to MAXIMIZE MOBILITY, ROM, STRENGTH, AND ACTIVITY TOLERANCE     Treatment/interventions: Exercise, Gait training, Stair training, Functional transfer training, LE strengthening/ROM, Patient/caregiver training, Bed mobility    Due to the presence of a limited number of treatment options and several comorbidities or personal factors that affect performance, as well as patient's evolving clinical presentation with changing characteristics, significant modifications of mobility and/or assistance were necessary to complete evaluation when examining total of 3 elements (includes body structures and functions, activity limitations and/or participation restriction) determines the degree of complexity for this patient is  LOW    Rehabilitation Potential:Fair LIMITED ABILITY TO PARTICIPATE AND QUESTIONABLE STATUS OF RECENT MOBILITY; 24 hour supervision recommended.    Discussed risk, benefits and Plan of Care with: Family    *note: Clinical Presentation and Decision Making includes the following sections:  Goals, PT assessment, treatment frequency and treatment/interventions):    History Based on physician charted EPIC/EMR information:     Medical Diagnosis: Renal insufficiency [[name]]  History of CVA (cerebrovascular accident) [[name]]  Scalp laceration, initial encounter [[name]]  Fall at home, initial encounter [[name], [name]]  Dementia without behavioral disturbance, unspecified dementia type [[name]]  Leukocytosis, unspecified type [[name]]  Traumatic subdural hematoma without loss of consciousness, initial encounter [[name]]  Type 2 diabetes mellitus with hyperglycemia, unspecified whether long term insulin use [[name]]    Problem list:  Patient Active Problem List   Diagnosis    Glaucoma, unspecified glaucoma type, unspecified laterality    Pure hypercholesterolemia    Essential hypertension    Type 2 diabetes mellitus with stage 3 chronic kidney disease, without long-term current use of insulin    History of stroke with residual deficit    Bradycardia    Heme positive stool    Nonverbal    Benign prostatic hyperplasia with urinary hesitancy    Microalbuminuria    Altered mental status    Bilateral pulmonary embolism    Fall (on) (from) other stairs and steps, initial encounter    Stroke        Past Medical/Surgical History:  Past Medical History:   Diagnosis Date    Cataracts, bilateral     Diabetes mellitus     Hypertension     Kidney disorder     stage IV kidney disease    Stroke       Past Surgical History:   Procedure Laterality Date    EYE SURGERY      LUNG LOBECTOMY      right eye transplant      unsuccessful       Social History    Information per Family, Chart review:    Home Living Arrangements:  Living Arrangements: NIECE   Assistance Available: FULL TIME  Type of Home: House  Home Layout: 2 STORY    Prior Level of Function:  Household ambulation  Mobility:  Modified independence  with  Single point cane  Fall history: 1 LEADING TO THIS ADMISSION    DME available at  home:  Single point cane    Subjective   PER NIECE: "THIS IS NOT MY [name]. HE WAS ABLE TO WALK THROUGH THE HOUSE WITH HIS CANE AND GO UP A FLIGHT OF STEPS BY HIMSELF. HE DRESSED HIMSELF. HE FED HIMSELF. HE WAS NOT LIKE THIS. I CAN'T TAKE HIM HOME LIKE THIS. I'VE HAD BACK SURGERY."   Patient/family/caregiver consent to therapy session is noted by the participation in the therapy session.    Patient/caregiver goal for PT: RETURN TO NIECE'S REPORT OF BASELINE FUNCTION.    Pain:  At Rest: 0/10  With Activity: 0/10  FACES SCALE  Location: N/A  Interventions: None required    Examination of Body Systems (Structures, Function, Activity and Participation)   Patients medical condition is appropriate for Physical therapy intervention at this time    Observation of patient:  Patient is in bed with Bed/chair alarm on, peripheral IV     Cognition:  UNABLE TO ASSESS WITH PATIENT'S APHASIA. PATIENT RESPONDED OCCASIONALLY TO GESTURES, BUT MORE OFTEN WITH PHYSICAL CUES. HE ATTEMPTED TO GET  OOB AND TO STAND AND WALK. HE SPONTANEOUSLY ATTEMPTED TO DON SHOES WHEN THEY WERE PRESENTED. HE ATTEMPTED TO REACH A [place] ON THE FLOOR DESPITE BEING RESTRAINED FROM BENDING TOO FAR OVER IN SITTING.    Vital Signs (Cardiovascular):  STABLE WITHOUT SYMPTOMS    Sensation: UNABLE TO ASSESS.     Balance:  Static Sitting:  Fair+  Dynamic Sitting:  Fair  Static Standing:  Poor+  Dynamic Standing:  Poor            Musculoskeletal Examination:            Range of motion:  LE'S. PATIENT DID NOT FOLLOW VERBAL INSTRUCTIONS OR GESTURES. NOTED SPONTANEOUS MOVEMENT THROUGH ABOUT HALF RANGE.       Strength:  LE WEAKNESS NOTED IN FUNCTIONAL ACTIVITIES. INITIALLY ABLE TO MAINTAIN STATIC STANDING BALANCE WITH MIN A X2, BUT WORSENED WITH FATIGUE.    Tone:         Functional Mobility:    Bed Mobility:  Supine to Sit:   Moderate assist (assist x2 for safety).   Cues for Sequencing., Cues for Hand placement., HOB elevated, Bed rail used, assist at trunk, assist  at LE(s)  Sit to Supine:   Moderate assist (assist x2 for safety).   Cues for Sequencing., HOB flat, assist at trunk, assist at LE(s)    Transfers:  Sit to Stand:  Moderate assist Maximal assist (assist x2 for safety) with Hand held assist.    SLOW TO STAND FROM BED. MUCH WORSE TO STAND FROM A CHAIR.  Stand to Sit:  Moderate assist (assist x2 for safety).    Cues for Sequencing, Cues for Hand Placement, POOR CONTROL.  PATIENT SAT OOB IN A CHAIR FOR 10 MINUTES. HE FED HIMSELF APPLESAUCE. HE WAS ABLE TO SLOWLY PUT ON SHOES. REQUIRED ASSIST TO CLOSE VELCRO STRAPS.    Locomotion:  LEVEL AMBULATION:  Distance: 3'  X2  Assistance level:  Moderate assist Maximal assist (assist x2 for safety)  Device:  Hand held assist  Pattern:  Step to, Decreased cadence, Decreased step length:  bilaterally, Decreased stance time:  right, Decreased clearance:  Bilaterally. MILD DECREASE IN BALANCE BED TO CHAIR. MODERATE DECREASE IN BALANCE CHAIR TO BED WITH DECREASED LE STABILITY.      Treatment Interventions this session:   Evaluation  Therapeutic activity  Gait training  Patient/family/caregiver education    Education Provided:   TOPICS: role of physical therapy, plan of care, goals of therapy and safety with mobility and ADLs     Learner educated: Family  Method: Explanation and Demonstration  Response to education: Verbalized understanding    Patient Position at End of Treatment:   Supine, in bed, Family/visitors present, Needs in reach, Bed/chair alarm set and No distress    Team Communication:     Spoke to: RN  Regarding: Pre-session re: patient status  Whiteboard updated: No  PT/PTA communication: via written note and verbal communication as needed.    Time of treatment:  Time Calculation  PT Received On: 02/04/18  Start Time: 1633  Stop Time: 1701  Time Calculation (min): 28 min    [place]

## 2018-02-04 NOTE — Progress Notes (Signed)
Neurosurgery    Length of Stay:0    Imaging:  CT Head (02/04/2018)    IMPRESSION:     1. Previous possible tiny acute left subdural hematoma no longer well visualized. No enlarging extra-axial fluid collections.    2. 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 No significant change or mass effect.    3. Frontal superficial injury with swelling and subcutaneous gas.      Assessment/Plan:    Repeat CT head shows no evidence of Subdural Hematoma or acute intracranial injury.  I do not believe that the patient has suffered a SDH as reported on the initial scan.    May resume Plavix and/or ASA as deemed necessary by the primary service.  There is no need for further imaging or Neurosurgical follow up.        Robyne Peers, MD

## 2018-02-04 NOTE — Plan of Care (Addendum)
NURSE NOTE SUMMARY  [facility]   Patient Name: [name]   Attending Physician: Salome Arnt, MD   Today's date:   02/04/2018 LOS: 0 days   Shift Summary:                                                              Pt arrived to unit around 2000, VSS, pt then went down to CT. New IV was placed when pt returned to room. Will continue to monitor.    Provider Notifications:      Rapid Response Notifications:  Mobility:      PMP Activity: Step 4 - Dangle at Bedside (02/04/2018 12:00 AM)     Weight tracking:  Family Dynamic:   Last 3 Weights for the past 72 hrs (Last 3 readings):   Weight   01/26/2018 2000 63.7 kg (140 lb 6.9 oz)             Recent Vitals Last Bowel Movement   BP: 156/84 (02/04/2018  4:16 AM)  Heart Rate: 100 (02/04/2018  4:16 AM)  Temp: 99 F (37.2 C) (02/04/2018  4:16 AM)  Resp Rate: 16 (02/04/2018  4:16 AM)  Height: 1.778 m (5\' 10" ) (5 ft 10 in) (02/06/2018  8:00 PM)  Weight: 63.7 kg (140 lb 6.9 oz) (02/06/2018  8:00 PM)  SpO2: 93 % (02/04/2018  4:16 AM)   Last BM Date: 01/14/2018         Problem: Moderate/High Fall Risk Score >5  Goal: Patient will remain free of falls  Outcome: Progressing  Flowsheets (Taken 02/04/2018 0000)  VH High Risk (Greater than 13): ALL REQUIRED LOW INTERVENTIONS;ALL REQUIRED MODERATE INTERVENTIONS;RED "HIGH FALL RISK" SIGNAGE;BED ALARM WILL BE ACTIVATED WHEN THE PATEINT IS IN BED WITH SIGNAGE "RESET BED ALARM";A CHAIR PAD ALARM WILL BE USED WHEN PATIENT IS UP SITTING IN A CHAIR;PATIENT IS TO BE SUPERVISED FOR ALL TOILETING ACTIVITIES

## 2018-02-04 NOTE — Progress Notes (Signed)
Per the [facility] Automatic Renal Dosing Protocol:    The following medication was adjusted based on the patient's creatinine clearance:    Medication: zosyn  Indication: Nosocomial, ventilator-acquired, healthcare associated, or aspiration pneumonia  SCr: 2.3 mg/dL  CrCl: 24.6 ml/min  Previous Dosing Regimen: 3.375g IV q6h  New Dosing Regimen: 2.25g IV q6h      Carley Hammed, PharmD  [facility] Department

## 2018-02-04 NOTE — Progress Notes (Signed)
Medicine Progress Note   [facility]   Patient Name: [name] LOS: 0 days   Attending Physician: Arman Filter, DO PCP: Berdine Dance Clarita Crane, MD      Hospital Course:                                                            [name] is a 78 y.o. male Ms. past medical history old stroke, CAD,diabetes mellitus, hypertension,stage 4 CKD and dementia who came in for extended for and skin laceration.  Patient has dementia patient cannot provide any medical information history obtained from the ER physician and the chart.  Patient was found to have possible tiny subdural hematoma.  Seen by neurosurgical service, recommend observation overnight and repeat a CT scan of the head if there is no hematoma patient can be discharged.  And hold on Plavix.  The scalp laceration been sutured. Repeat CT showed no evidence of bleed. Meds resumed. Placement pending.     Assessment and Plan:      Possible tiny acute subdural hematoma on the left, without mass effect  Due to accidental fall  No neuro deficit  Neurosurgery consulted recommend repeat a CT scan if CT scan no increase of hematoma patient can be discharged  Control blood pressure  Hold Plavix and an aspirin  --> normal repeat CT, can resume meds      Scalp laceration due to accidental fall  S/P suture  CT of the neck neck did not show to fracture or dislocation      Esophageal foreign body possible food or mass  This is detected on the CT of the neck  Will order CT of chest without contrast  --> normal CT      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  Hold aspirin and Plavix due to hematoma, continue statin  --> resume meds    Stage 4 CKD  Creatinine at baseline  Avoid unnecessary nephrotoxic medication  Recent renal ultrasound showed medical renal disease      Hypertension   Continue home BP medications  IV hydralazine for blood pressure greater than  160      Diabetesmellitus  hold oral hypoglycemics  Add sliding scale insulin      Single-vessel CAD continue aspirin statin       Acute Bilateral PE Dec 2019  No acute pulmonary issue  Resume Eliquis    Leukocytosis with possible aspiration PNA on CT  - zosyn        DVT PPx: Eliquis    Dispo: Observation -- family says cannot take care of him at home, PT/OT eval ordered    Healthcare Proxy: Family    Code: Full code   Subjective   Demented        Objective   Physical Exam:     Vitals: T:98.1 F (36.7 C) (Oral), BP:174/79, HR:(!) 101, RR:16, SaO2:96%    General: Patient is awake. In no acute distress.  HEENT: No conjunctival drainage, vision is intact, anicteric sclera.  Neck: Supple, no thyromegaly.  Chest: CTA bilaterally. No rhonchi, no wheezing. No use of accessory muscles.  CVS: Normal rate and regular rhythm no murmurs, without JVD, no pitting edema, pulses palpable.  Abdomen: Soft, non-tender,  no guarding or rigidity, with normal bowel sounds.  Extremities: No calf swelling and no gross deformity.  Skin: Warm, dry, no rash and no worrisome lesions.  NEURO: No motor or sensory deficits.  Psychiatric: Alert, interactive, appropriate, normal affect.  Weight Monitoring 11/28/2017 12/29/2017 12/29/2017 12/31/2017 01/01/2018 01/17/2018 01/12/2018   Height 167.6 cm - 167.6 cm - - 177.8 cm 177.8 cm   Weight 66.679 kg 64 kg 64 kg 63.2 kg 62.1 kg 64.91 kg 63.7 kg   Weight Method - Actual - Standing Scale Standing Scale - Bed Scale   BMI (calculated) 23.8 kg/m2 - 22.8 kg/m2 - - 20.6 kg/m2 20.2 kg/m2         Intake/Output Summary (Last 24 hours) at 02/04/2018 1921  Last data filed at 02/04/2018 1300  Gross per 24 hour   Intake 220 ml   Output 500 ml   Net -280 ml     Body mass index is 20.15 kg/m.     Meds:     Current Facility-Administered Medications   Medication Dose Route Frequency    amLODIPine  5 mg Oral QAM    finasteride  5 mg Oral QAM    glimepiride  1 mg Oral QAM AC    hydroCHLOROthiazide  12.5 mg  Oral Daily    insulin lispro (1 Unit Dial)  1-9 Units Subcutaneous TID AC    And    insulin lispro (1 Unit Dial)  1-7 Units Subcutaneous QHS    lactobacillus species  50 Billion CFU Oral Daily    lisinopril  20 mg Oral Daily    piperacillin-tazobactam  2.25 g Intravenous Q6H    simvastatin  20 mg Oral QHS    sodium bicarbonate  650 mg Oral BID    sodium chloride (PF)  3 mL Intravenous Q8H    vitamin D  25 mcg Oral QAM      sodium chloride 50 mL/hr at 02/04/18 1614     PRN Meds: dextrose, glucagon (rDNA), hydrALAZINE, NSG Communication: Glucose POCT order (AC, HS) **AND** insulin lispro (1 Unit Dial) **AND** insulin lispro (1 Unit Dial) **AND** insulin lispro (1 Unit Dial), naloxone.     LABS:     Estimated Creatinine Clearance: 24.6 mL/min (A) (based on SCr of 2.3 mg/dL (H)).  Recent Labs   Lab 02/04/18  0339 01/24/2018  1300   WBC 14.7* 15.4*   RBC 3.84* 4.31   Hemoglobin 11.7* 12.9*   Hematocrit 35.5* 41.3   MCV 93 96   PLT CT 165 202     Recent Labs   Lab 01/28/2018  1300   PT 12.3*   PT INR 1.2     Recent Labs   Lab 01/26/2018  1300   Creatine Kinase (CK) 375*     Lab Results   Component Value Date    HGBA1CPERCNT 5.7 01/19/2018     Recent Labs   Lab 02/04/18  0339 02/09/2018  1313   Glucose 152*  --    Sodium 135*  --    Potassium 4.9  --    Chloride 105  --    CO2 23  --    BUN 27*  --    Creatinine 2.30*  --    i-STAT Creatinine  --  2.40*   EGFR 31* 29*   Calcium 9.2  --          Recent Labs   Lab 01/11/2018  1839   Specific Gravity, UR 1.011   pH, Urine 7.0  Protein, UR Negative   Glucose, UA >=500*   Ketones UA 5   Bilirubin, UA Negative   Blood, UA Negative   Nitrite, UA Negative   Urobilinogen, UA Normal   Leukocyte Esterase, UA Negative   RBC, UA 1      Patient Lines/Drains/Airways Status    Active PICC Line / CVC Line / PIV Line / Drain / Airway / Intraosseous Line / Epidural Line / ART Line / Line / Wound / Pressure Ulcer / NG/OG Tube     Name:   Placement date:   Placement time:   Site:   Days:     Peripheral IV 02/04/18 Anterior;Left Upper Arm   02/04/18    1610    Upper Arm   less than 1               Ct Head Wo Contrast    Result Date: 02/04/2018  1. Previous possible tiny acute left subdural hematoma no longer well visualized. No enlarging extra-axial fluid collections. 2. 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 No significant change or mass effect. 3. Frontal superficial injury with swelling and subcutaneous gas. ReadingStation:[email or web address]    Ct Head Wo- (rad Read)    Result Date: 01/11/2018  1.  Possible tiny acute subdural hematoma on the left, without mass effect: Dr. Norman Clay was informed of this by telephone at 1354 hours on 03 February 2018. 2.  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 3.  Other chronic intracranial changes as above. 4.  Frontal scalp contusion. ReadingStation:[email or web address]    Ct Chest Wo Contrast    Result Date: 01/13/2018  1. No radiopaque foreign body within the esophagus, previously noted mass or ingested material in the upper thoracic esophagus has resolved. 2. Nodular infiltrate of the right lung base along the dependent aspect likely the basis of aspiration pneumonia. Would recommend follow-up to resolution. 3. Evidence for granulomatous disease. There is a 3 mm nonspecific right upper lobe nodule. Would recommend a 3-6 month follow-up to evaluate stability. 4. Multiple ill-defined hypodensities within the liver. These are nonspecific however metastatic lesions cannot be excluded. Would recommend dedicated contrasted triphasic CT of the abdomen(include delayed postcontrast evaluation), or contrasted MRI for more definitive evaluation. ReadingStation:[email or web address]    Ct Cervical Spine Wo    Result Date: 02/09/2018  No fractures are identified. Multilevel degenerative facet disease. There is at least mild spinal stenosis at C3-4 and C4-5, and moderate  spinal stenosis at C5-6. Some food or a mass is visible in the upper thoracic esophagus, filling the lumen. ReadingStation:[email or web address]    Xr Chest Ap Portable    Result Date: 01/20/2018  No acute findings. [place] Needs:  There are no questions and answers to display.       Nutrition assessment done in collaboration with Registered Dietitians:     Time spent:      Arman Filter, DO     02/04/18,7:21 PM   MRN: [id number]                                      CSN: [id number] DOB: [birth year]

## 2018-02-04 NOTE — SLP Eval Note (Signed)
Thank you for allowing [place] to participate in the care of  [facility].  Regulations from the Center for Medicare and Medicaid Services ([place]) require your review and approval for this Plan of Care.  Please co-sign this note indicating you are in agreement with the Speech Language Pathology Plan of Care.    [facility]  Speech Language Pathology   Bedside Swallow Evaluation    Patient: [name]     CSN#: [id number]   Referring Physician: Salome Arnt, MD Date of Referral: 02/08/2018    SLP reason:  Consult received for SLP Bedside Swallow Evaluation and Treatment for dysphagia. Pt did not pass 3oz water screen as he was unable to finish entire 3oz. Pt w/ hx of SLP notes in epic.     ASSESSMENT/PLAN/RECOMMENDATIONS:     SLP diagnosis:  Mild Oropharyngeal Dysphagia    Diet Recommendations:  Soft and Bite Sized diet/Thin liquids   Additional restrictions:  Consistent Carbohydrate, Cardiac per pt diagnosis/medical hx and discussion with nurse.    Administration of Medications: Meds whole in applesauce/pureed    Precautions/Compensations:  Feed only when awake/alert, Position patient upright 90 degrees for all po intake/meds, Keep patient upright 30 minutes after meals, Alternate liquids and solids (liquid wash), Take small bites/sips, Eat/feed slowly, 1:1 Supervision with meals. Pt needs to be feed.   Suggestions for Feeding:  1:1 supervision (next to patient to supervise each bite/sip, provide hands-on assist/cues prn, 100% of meal visualized)  Prognosis:  Good  Aspiration Risk: cognitive deficits/confusion  Modified Barium Swallow Study recommended: no;Not indicated at this time  SLP Follow-Up Inpatient Treatment needs: f/u x 1 fo diet tolerance   Duration of Inpatient Treatment: 1 visit for diet tolerance   Projected Discharge Recommendations:  No further SLP needs  Other referrals:  n/a    Goals:  Pt will:  STG:  (In 3-4 days)  1. Pt will tolerate a Soft and Bite Sized diet with THIN liquids free  of signs/symptoms of aspiration. (NEW)  2. Pt/family/staff education (NEW)      LTG: (By discharge)  1. Pt will tolerate the least restrictive diet free of signs/symptoms of aspiration. (NEW)        HISTORY OF PRESENT ILLNESS:       Medical Diagnosis: Renal insufficiency [N28.9]  History of CVA (cerebrovascular accident) [Z86.73]  Scalp laceration, initial encounter [S01.01XA]  Fall at home, initial encounter [W19.XXXA, M35.361]  Dementia without behavioral disturbance, unspecified dementia type [F03.90]  Leukocytosis, unspecified type [D72.829]  Traumatic subdural hematoma without loss of consciousness, initial encounter [S06.5X0A]  Type 2 diabetes mellitus with hyperglycemia, unspecified whether long term insulin use [E11.65]    History of Present Illness: [name] is a 78 y.o. male admitted on 01/13/2018 with   Patient Active Problem List   Diagnosis    Glaucoma, unspecified glaucoma type, unspecified laterality    Pure hypercholesterolemia    Essential hypertension    Type 2 diabetes mellitus with stage 3 chronic kidney disease, without long-term current use of insulin    History of stroke with residual deficit    Bradycardia    Heme positive stool    Nonverbal    Benign prostatic hyperplasia with urinary hesitancy    Microalbuminuria    Altered mental status    Bilateral pulmonary embolism    Fall (on) (from) other stairs and steps, initial encounter    Stroke        Past Medical/Surgical History:  Past Medical History:  Diagnosis Date    Cataracts, bilateral     Diabetes mellitus     Hypertension     Kidney disorder     stage IV kidney disease    Stroke       Past Surgical History:   Procedure Laterality Date    EYE SURGERY      LUNG LOBECTOMY      right eye transplant      unsuccessful         Baseline cognitive-communication/swallowing status:  Pt w/ hx of dementia/ aphasia and is nonverbal at baseline. Pt w/ soft/bite sized diet w/ thin liquids at baseline.     SUBJECTIVE:       Subjective: Patient is agreeable to participation in the therapy session. Nursing clears patient for therapy. Patients medical condition is appropriate for Speech therapy intervention at this time.    S: pt nonverbal but gestures in attempts to communicate.       OBJECTIVE:     Observation of Patient/Vital Signs:    Positioning: Patient is upright in bed.  Respiratory status: Room air  Source of nutrition at time of eval:  Regular diet, thin liquids  Other medical equipment in place: IV   O:    Oral Motor/Swallow Skills:    Dentition: Incomplete dentition  Strength:  Impaired, Generalized weakness, Slowed/effortful movements  Coordination:  WFL  ROM: Unable to follow commands for directions   Symmetry:  WFL   Oral Apraxia:  N/A  Sensation:  WFL  Gag Reflex:  Not assessed  Laryngeal Elev:  WFL, Timely initiation of the swallow reflex, +4 of 4 fingers width laryngeal elevation palpated  Cough/Grunt:  reflexive cough not elicited  Motor Speech/Voice:  Pt nonverbal/non vocal     Consistencies presented:  ICE CHIPS: Single ice chip x2  No signs/symptoms of aspiration, no change in respiratory status, no vocal change, timely initiation of the swallow reflex  THIN LIQUID: straw sip x10   No signs/symptoms of aspiration, no change in respiratory status, no vocal change, timely initiation of the swallow reflex  PUREED: 1 tsp x5   No signs/symptoms of aspiration, no change in respiratory status, no vocal change, timely initiation of the swallow reflex  MECHANICAL SOFT:  1/2 tsp x4  No signs/symptoms of aspiration, no change in respiratory status, no vocal change, timely initiation of the swallow reflex, prolonged but functional mastication, anterior munching pattern of mastication    Deferred regular trial d/t baseline diet of Soft/Bite Sized with THIN and lack of dentition.     Pt/Family/Caregiver Education Provided:     Patient was/were educated re: role of slp, purpose of evaluation, sign/symptoms/risks of  aspiration, compensatory swallowing strategies (Position upright 90 degrees for all po intake/meds, Keep patient upright 30 minutes after meals, Alternate liquids and solids, Take small bites/sips, Eat/feed slowly), results/recommendations of today's evaluation  Good understanding was verbalized/demonstrated: Unable to assess   Comprehension limited by: dementia    Team Communication:     Spoke to : RN/LPN - [place]  Regarding: sign/symptoms/risks of aspiration, compensatory swallowing strategies (Position upright 90 degrees for all po intake/meds, Keep patient upright 30 minutes after meals, Alternate liquids and solids, Take small bites/sips, Eat/feed slowly), results/recommendations of today's evaluation  Good understanding was verbalized: yes      Time of Treatment:   SLP Received On: 02/04/18  Start Time: 3818  Stop Time: 1110  Time Calculation (min): 27 min    SLP Visit Number: 1  Evaluation was completed by:  Jobin Montelongo L. Mead Slane, M.S., [place]

## 2018-02-04 NOTE — Progress Notes (Signed)
02/04/18 Weekend RNCM RP: Bedside nurse called saying that niece cannot take back home. His function has declined since his fall. Niece said she has a bad back and cannot lift him. Observation status. [employer]. Bedside nurse obtained order for PT/OT evals. Clinical course to determine discharge plan.     Marco Collie RN, BSN/Case Manager  [phone]

## 2018-02-04 NOTE — Plan of Care (Signed)
Patient placed on a soft and bite size diet with thin liquids by speech therapy.  Patient pleasant.  Took all meds whole with applesauce.  Patient resting in bed.

## 2018-02-05 LAB — BASIC METABOLIC PANEL
Anion Gap: 15.6 mMol/L (ref 7.0–18.0)
BUN / Creatinine Ratio: 16 Ratio (ref 10.0–30.0)
BUN: 41 mg/dL — ABNORMAL HIGH (ref 7–22)
CO2: 19 mMol/L — ABNORMAL LOW (ref 20–30)
Calcium: 8.5 mg/dL (ref 8.5–10.5)
Chloride: 102 mMol/L (ref 98–110)
Creatinine: 2.57 mg/dL — ABNORMAL HIGH (ref 0.80–1.30)
EGFR: 27 mL/min/{1.73_m2} — ABNORMAL LOW (ref 60–150)
Glucose: 174 mg/dL — ABNORMAL HIGH (ref 71–99)
Osmolality Calculated: 279 mOsm/kg (ref 275–300)
Potassium: 4.6 mMol/L (ref 3.5–5.3)
Sodium: 132 mMol/L — ABNORMAL LOW (ref 136–147)

## 2018-02-05 LAB — CBC AND DIFFERENTIAL
Basophils Absolute: 0 10*3/uL (ref 0.0–0.3)
Eosinophils Absolute: 0 10*3/uL (ref 0.0–0.8)
Hematocrit: 37.2 % — ABNORMAL LOW (ref 39.0–52.5)
Hemoglobin: 12 gm/dL — ABNORMAL LOW (ref 13.0–17.5)
Lymphocytes Absolute: 1.6 10*3/uL (ref 0.6–5.1)
Lymphocytes: 9 % — ABNORMAL LOW (ref 15.0–46.0)
MCH: 30 pg (ref 28–35)
MCHC: 32 gm/dL (ref 32–36)
MCV: 93 fL (ref 80–100)
MPV: 9.7 fL (ref 6.0–10.0)
Monocytes Absolute: 1.2 10*3/uL (ref 0.1–1.7)
Monocytes: 7 % (ref 3.0–15.0)
Neutrophils %: 84 % — ABNORMAL HIGH (ref 42.0–78.0)
Neutrophils Absolute: 14.5 10*3/uL — ABNORMAL HIGH (ref 1.7–8.6)
PLT CT: 175 10*3/uL (ref 130–440)
RBC: 4 10*6/uL (ref 4.00–5.70)
RDW: 11.8 % (ref 11.0–14.0)
WBC: 17.3 10*3/uL — ABNORMAL HIGH (ref 4.0–11.0)

## 2018-02-05 LAB — VH DEXTROSE STICK GLUCOSE
Glucose POCT: 168 mg/dL — ABNORMAL HIGH (ref 71–99)
Glucose POCT: 169 mg/dL — ABNORMAL HIGH (ref 71–99)
Glucose POCT: 184 mg/dL — ABNORMAL HIGH (ref 71–99)
Glucose POCT: 142 mg/dL — ABNORMAL HIGH (ref 71–99)
Glucose POCT: 188 mg/dL — ABNORMAL HIGH (ref 71–99)

## 2018-02-05 LAB — VH MRSA DETECTION BY DNA AMPLIFICATION (NARES): MRSA: NOT DETECTED

## 2018-02-05 NOTE — Plan of Care (Signed)
NURSE NOTE SUMMARY  [facility] [place]   Patient Name: [name]   Attending Physician: Arman Filter, DO   Today's date:   02/05/2018 LOS: 0 days   Shift Summary:                                                              Pt is nonverbal and moans when needing things and follows commands.  Uses call bell to call staff.  Lungs are clear/decreased in the bases.  Blind in right eye.  Abrasions to forehead, bridge of nose and back of head: OTA.  RUE: contracted.   Right side is weak from previous CVA.  RLE: flicker of movements and non-purposeful movements.   Voiding in the urinal.  Resting in bed at this time.  Will continue to monitor.     Provider Notifications:      Rapid Response Notifications:  Mobility:      PMP Activity: Step 3 - Bed Mobility (02/04/2018  8:55 PM)     Weight tracking:  Family Dynamic:   Last 3 Weights for the past 72 hrs (Last 3 readings):   Weight   01/11/2018 2000 63.7 kg (140 lb 6.9 oz)             Recent Vitals Last Bowel Movement   BP: 139/74 (02/04/2018 10:53 PM)  Heart Rate: (!) 106 (02/04/2018 10:53 PM)  Temp: 99 F (37.2 C) (02/04/2018 10:53 PM)  Resp Rate: 18 (02/04/2018 10:53 PM)  SpO2: 94 % (02/04/2018 10:53 PM)   Last BM Date: 02/01/2018        Problem: Moderate/High Fall Risk Score >5  Goal: Patient will remain free of falls  Outcome: Progressing  Flowsheets (Taken 02/04/2018 2055)  VH High Risk (Greater than 13): ALL REQUIRED LOW INTERVENTIONS;RED "HIGH FALL RISK" SIGNAGE;ALL REQUIRED MODERATE INTERVENTIONS;BED ALARM WILL BE ACTIVATED WHEN THE PATEINT IS IN BED WITH SIGNAGE "RESET BED ALARM";A CHAIR PAD ALARM WILL BE USED WHEN PATIENT IS UP SITTING IN A CHAIR;PATIENT IS TO BE SUPERVISED FOR ALL TOILETING ACTIVITIES

## 2018-02-05 NOTE — Progress Notes (Signed)
02/05/18 Weekend RNCM RP: PT recommending LTC. Family cannot take pt home-see previous CM note. Referral to [facility] to screen for LTC Medicaid and [employer] requested.       Marco Collie RN, BSN/Case Manager  [phone]

## 2018-02-05 NOTE — Progress Notes (Signed)
Medicine Progress Note   [facility]   Patient Name: [name],[name] LOS: 0 days   Attending Physician: Arman Filter, DO PCP: Berdine Dance Clarita Crane, MD      Hospital Course:                                                            Hendricks Milo Hoffman Hoffman is a 78 y.o. male Ms. past medical history old stroke, CAD,diabetes mellitus, hypertension,stage 4 CKD and dementia who came in for extended for and skin laceration.  Patient has dementia patient cannot provide any medical information history obtained from the ER physician and the chart.  Patient was found to have possible tiny subdural hematoma.  Seen by neurosurgical service, recommend observation overnight and repeat a CT scan of the head if there is no hematoma patient can be discharged.  And hold on Plavix.  The scalp laceration been sutured. Repeat CT showed no evidence of bleed. Meds resumed. Placement pending.     Assessment and Plan:      Possible tiny acute subdural hematoma on the left, without mass effect  Due to accidental fall  No neuro deficit  Neurosurgery consulted recommend repeat a CT scan if CT scan no increase of hematoma patient can be discharged  Control blood pressure  Hold Plavix and an aspirin  --> normal repeat CT, can resume meds      Scalp laceration due to accidental fall  S/P suture  CT of the neck neck did not show to fracture or dislocation      Esophageal foreign body possible food or mass  This is detected on the CT of the neck  Will order CT of chest without contrast  --> normal CT      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  Hold aspirin and Plavix due to hematoma, continue statin  --> resume meds    Stage 4 CKD  Creatinine at baseline  Avoid unnecessary nephrotoxic medication  Recent renal ultrasound showed medical renal disease      Hypertension   Continue home BP medications  IV hydralazine for blood pressure greater than  160      Diabetesmellitus  hold oral hypoglycemics  Add sliding scale insulin      Single-vessel CAD continue aspirin statin       Acute Bilateral PE Dec 2019  No acute pulmonary issue  Resume Eliquis    Leukocytosis with possible aspiration PNA on CT  - cont zosyn        DVT PPx: Eliquis    Dispo: Observation -- family says cannot take care of him at home, PT/OT eval ordered, recs LTC    Healthcare Proxy: Family    Code: Full code   Subjective   Demented        Objective   Physical Exam:     Vitals: T:97.3 F (36.3 C) (Axillary), BP:151/71, HR:97, RR:14, SaO2:93%    General: Patient is awake. In no acute distress.  HEENT: No conjunctival drainage, vision is intact, anicteric sclera.  Neck: Supple, no thyromegaly.  Chest: CTA bilaterally. No rhonchi, no wheezing. No use of accessory muscles.  CVS: Normal rate and regular rhythm no murmurs, without JVD, no pitting edema, pulses palpable.  Abdomen:  Soft, non-tender, no guarding or rigidity, with normal bowel sounds.  Extremities: No calf swelling and no gross deformity.  Skin: Warm, dry, no rash and no worrisome lesions.  NEURO: No motor or sensory deficits.  Psychiatric: Alert, interactive, appropriate, normal affect.  Weight Monitoring 11/28/2017 12/29/2017 12/29/2017 12/31/2017 01/01/2018 01/17/2018 02/01/2018   Height 167.6 cm - 167.6 cm - - 177.8 cm 177.8 cm   Weight 66.679 kg 64 kg 64 kg 63.2 kg 62.1 kg 64.91 kg 63.7 kg   Weight Method - Actual - Standing Scale Standing Scale - Bed Scale   BMI (calculated) 23.8 kg/m2 - 22.8 kg/m2 - - 20.6 kg/m2 20.2 kg/m2         Intake/Output Summary (Last 24 hours) at 02/05/2018 1401  Last data filed at 02/05/2018 1355  Gross per 24 hour   Intake 220 ml   Output 500 ml   Net -280 ml     Body mass index is 20.15 kg/m.     Meds:     Current Facility-Administered Medications   Medication Dose Route Frequency    amLODIPine  5 mg Oral QAM    apixaban  5 mg Oral Q12H [facility]    clopidogrel  75 mg Oral QAM    finasteride  5  mg Oral QAM    glimepiride  1 mg Oral QAM AC    hydroCHLOROthiazide  12.5 mg Oral Daily    insulin lispro (1 Unit Dial)  1-9 Units Subcutaneous TID AC    And    insulin lispro (1 Unit Dial)  1-7 Units Subcutaneous QHS    lactobacillus species  50 Billion CFU Oral Daily    lisinopril  20 mg Oral Daily    piperacillin-tazobactam  2.25 g Intravenous Q6H    simvastatin  20 mg Oral QHS    sodium bicarbonate  650 mg Oral BID    sodium chloride (PF)  3 mL Intravenous Q8H    vitamin D  25 mcg Oral QAM      sodium chloride 50 mL/hr at 02/04/18 1614     PRN Meds: dextrose, glucagon (rDNA), hydrALAZINE, NSG Communication: Glucose POCT order (AC, HS) **AND** insulin lispro (1 Unit Dial) **AND** insulin lispro (1 Unit Dial) **AND** insulin lispro (1 Unit Dial), naloxone.     LABS:     Estimated Creatinine Clearance: 22 mL/min (A) (based on SCr of 2.57 mg/dL (H)).  Recent Labs   Lab 02/05/18  0336 02/04/18  0339   WBC 17.3* 14.7*   RBC 4.00 3.84*   Hemoglobin 12.0* 11.7*   Hematocrit 37.2* 35.5*   MCV 93 93   PLT CT 175 165     Recent Labs   Lab 01/25/2018  1300   PT 12.3*   PT INR 1.2     Recent Labs   Lab 02/04/2018  1300   Creatine Kinase (CK) 375*     Lab Results   Component Value Date    HGBA1CPERCNT 5.7 02/10/2018     Recent Labs   Lab 02/05/18  0336 02/04/18  0339 01/22/2018  1313   Glucose 174* 152*  --    Sodium 132* 135*  --    Potassium 4.6 4.9  --    Chloride 102 105  --    CO2 19* 23  --    BUN 41* 27*  --    Creatinine 2.57* 2.30*  --    i-STAT Creatinine  --   --  2.40*   EGFR 27* 31*  29*   Calcium 8.5 9.2  --          Recent Labs   Lab 02/09/2018  1839   Specific Gravity, UR 1.011   pH, Urine 7.0   Protein, UR Negative   Glucose, UA >=500*   Ketones UA 5   Bilirubin, UA Negative   Blood, UA Negative   Nitrite, UA Negative   Urobilinogen, UA Normal   Leukocyte Esterase, UA Negative   RBC, UA 1      Patient Lines/Drains/Airways Status    Active PICC Line / CVC Line / PIV Line / Drain / Airway / Intraosseous Line  / Epidural Line / ART Line / Line / Wound / Pressure Ulcer / NG/OG Tube     Name:   Placement date:   Placement time:   Site:   Days:    Peripheral IV 02/04/18 Anterior;Left Upper Arm   02/04/18    1610    Upper Arm   less than 1               Ct Head Wo Contrast    Result Date: 02/04/2018  1. Previous possible tiny acute left subdural hematoma no longer well visualized. No enlarging extra-axial fluid collections. 2. 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 No significant change or mass effect. 3. Frontal superficial injury with swelling and subcutaneous gas. ReadingStation:[email or web address]    Ct Head Wo- (rad Read)    Result Date: 01/16/2018  1.  Possible tiny acute subdural hematoma on the left, without mass effect: Dr. Norman Clay was informed of this by telephone at 1354 hours on 03 February 2018. 2.  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 3.  Other chronic intracranial changes as above. 4.  Frontal scalp contusion. ReadingStation:[email or web address]    Ct Chest Wo Contrast    Result Date: 01/25/2018  1. No radiopaque foreign body within the esophagus, previously noted mass or ingested material in the upper thoracic esophagus has resolved. 2. Nodular infiltrate of the right lung base along the dependent aspect likely the basis of aspiration pneumonia. Would recommend follow-up to resolution. 3. Evidence for granulomatous disease. There is a 3 mm nonspecific right upper lobe nodule. Would recommend a 3-6 month follow-up to evaluate stability. 4. Multiple ill-defined hypodensities within the liver. These are nonspecific however metastatic lesions cannot be excluded. Would recommend dedicated contrasted triphasic CT of the abdomen(include delayed postcontrast evaluation), or contrasted MRI for more definitive evaluation. ReadingStation:[email or web address]    Ct Cervical Spine Wo    Result Date: 02/05/2018  No  fractures are identified. Multilevel degenerative facet disease. There is at least mild spinal stenosis at C3-4 and C4-5, and moderate spinal stenosis at C5-6. Some food or a mass is visible in the upper thoracic esophagus, filling the lumen. ReadingStation:[email or web address]    Xr Chest Ap Portable    Result Date: 02/06/2018  No acute findings. [place] Needs:  There are no questions and answers to display.       Nutrition assessment done in collaboration with Registered Dietitians:     Time spent:      Arman Filter, DO     02/05/18,2:01 PM   MRN: [id number]  CSN: [id number] DOB: 1940-06-14

## 2018-02-05 NOTE — Plan of Care (Signed)
NURSE NOTE SUMMARY  [facility] [name]   Patient Name: [name]   Attending Physician: Arman Filter, DO   Today's date:   02/05/2018 LOS: 0 days   Shift Summary:                                                              Patient lives with niece and nephew.  At baseline he ambulates with a cane, dresses and feeds himself, makes his bed, and ambulates around the house including navigating a flight of stairs.  Niece says she cannot take him home and continue to care for him if he is going to be dependent on her, especially for mobility needs.  She says she has lifting restrictions.  Niece would like patient to be assisted in getting medicaid and placement in a SNF.      Provider Notifications:      Rapid Response Notifications:  Mobility:      PMP Activity: Step 3 - Bed Mobility (02/05/2018  6:00 PM)     Weight tracking:  Family Dynamic:   Last 3 Weights for the past 72 hrs (Last 3 readings):   Weight   01/28/2018 2000 63.7 kg (140 lb 6.9 oz)             Recent Vitals Last Bowel Movement   BP: 154/70 (02/05/2018  4:20 PM)  Heart Rate: (!) 102 (02/05/2018  4:20 PM)  Temp: 98.2 F (36.8 C) (02/05/2018  4:20 PM)  Resp Rate: 16 (02/05/2018  4:20 PM)  SpO2: 98 % (02/05/2018  4:20 PM)   Last BM Date: 01/22/2018

## 2018-02-06 LAB — VH DEXTROSE STICK GLUCOSE
Glucose POCT: 224 mg/dL — ABNORMAL HIGH (ref 71–99)
Glucose POCT: 215 mg/dL — ABNORMAL HIGH (ref 71–99)
Glucose POCT: 250 mg/dL — ABNORMAL HIGH (ref 71–99)
Glucose POCT: 238 mg/dL — ABNORMAL HIGH (ref 71–99)
Glucose POCT: 230 mg/dL — ABNORMAL HIGH (ref 71–99)

## 2018-02-06 LAB — CBC AND DIFFERENTIAL
Basophils %: 0.2 % (ref 0.0–3.0)
Basophils Absolute: 0 10*3/uL (ref 0.0–0.3)
Eosinophils %: 0 % (ref 0.0–7.0)
Eosinophils Absolute: 0 10*3/uL (ref 0.0–0.8)
Hematocrit: 40 % (ref 39.0–52.5)
Hemoglobin: 13 gm/dL (ref 13.0–17.5)
Lymphocytes Absolute: 0.8 10*3/uL (ref 0.6–5.1)
Lymphocytes: 4.9 % — ABNORMAL LOW (ref 15.0–46.0)
MCH: 31 pg (ref 28–35)
MCHC: 33 gm/dL (ref 32–36)
MCV: 94 fL (ref 80–100)
MPV: 8.9 fL (ref 6.0–10.0)
Monocytes Absolute: 1 10*3/uL (ref 0.1–1.7)
Monocytes: 6.4 % (ref 3.0–15.0)
Neutrophils %: 88.5 % — ABNORMAL HIGH (ref 42.0–78.0)
Neutrophils Absolute: 14.3 10*3/uL — ABNORMAL HIGH (ref 1.7–8.6)
PLT CT: 162 10*3/uL (ref 130–440)
RBC: 4.25 10*6/uL (ref 4.00–5.70)
RDW: 11.9 % (ref 11.0–14.0)
WBC: 16.1 10*3/uL — ABNORMAL HIGH (ref 4.0–11.0)

## 2018-02-06 LAB — BASIC METABOLIC PANEL
Anion Gap: 16.4 mMol/L (ref 7.0–18.0)
BUN / Creatinine Ratio: 14.7 Ratio (ref 10.0–30.0)
BUN: 42 mg/dL — ABNORMAL HIGH (ref 7–22)
CO2: 19 mMol/L — ABNORMAL LOW (ref 20–30)
Calcium: 8.6 mg/dL (ref 8.5–10.5)
Chloride: 105 mMol/L (ref 98–110)
Creatinine: 2.85 mg/dL — ABNORMAL HIGH (ref 0.80–1.30)
EGFR: 24 mL/min/{1.73_m2} — ABNORMAL LOW (ref 60–150)
Glucose: 223 mg/dL — ABNORMAL HIGH (ref 71–99)
Osmolality Calculated: 289 mOsm/kg (ref 275–300)
Potassium: 4.4 mMol/L (ref 3.5–5.3)
Sodium: 136 mMol/L (ref 136–147)

## 2018-02-06 MED ORDER — LANTUS SOLOSTAR 100 UNIT/ML SC SOPN
7.00 [IU] | PEN_INJECTOR | Freq: Every evening | SUBCUTANEOUS | Status: DC
Start: 2018-02-06 — End: 2018-02-07
  Administered 2018-02-06: 22:00:00 7 [IU] via SUBCUTANEOUS
  Filled 2018-02-06: qty 3

## 2018-02-06 MED ORDER — ONDANSETRON HCL 4 MG/2ML IJ SOLN
4.00 mg | INTRAMUSCULAR | Status: DC | PRN
Start: 2018-02-06 — End: 2018-02-21
  Administered 2018-02-06 – 2018-02-13 (×5): 4 mg via INTRAVENOUS
  Filled 2018-02-06 (×5): qty 2

## 2018-02-06 NOTE — Progress Notes (Signed)
Readmission Risk  [facility] [place]   Patient Name: [name]   Attending Physician: Arman Filter, DO   Today's date:   02/06/2018 LOS: 0 days   Expected Discharge Date Expected Discharge Date: (pending placement)    Readmission Assessment:                                                              Discharge Planning  Expected Discharge Date: (pending placement)  Does the patient have perscription coverage?: Yes  Utilize [facility]: No  Confirmed PCP with Pt: Yes  Confirmed PCP name: Dr. Berdine Dance  Last PCP Visit Date: About 2 weeks ago.  Confirm Transport to F/U Appt.: Self/Private Vehicle/Friend  Social Work Referral: Not Applicable  Anticipated Home Health at [place]: No  Anticipated Placement at [place]: Yes  CM Comments: 02/06/18 RNCM: Pt adm s/p fall and has subdural hematoma.  PTA lived with family and niece is his pcg.  Pt ambulates with a cane and needs help with bathing, and grooming and indep with other adls.  Per pcg pt has behaviors and can be aggressive at times.  IVF's.  Abx.  N/V.  Labs.  Pain mgmt. Neuro.  NCM met with niece for screening and is looking to have pt placed for LTC.  Niece has applied for Medicaid and this was done about 2 weeks.  NCM will ask [facility] to come and speak with niece.  D/C plan TBD.  NCM will continue to follow.       IDPA:      Healthcare Decisions  Interviewed:: Family  Name of interviewee if other than the pt:: [name] (niece)  POA  Advance Directive: Patient has advance directive, copy in chart  Healthcare Agent Appointed: No  Prior to admission  Prior level of function: Needs assistance with ADLs, Ambulates with assistive device(Uses a cane.)  Type of Residence: Private residence  Home Layout: Multi-level(4 steps to enter the front door.)  Have running water, electricity, heat, etc?: Yes  Living Arrangements: Family members  How do you get to your MD appointments?: Family  How do you get your groceries?: Family  Who fixes  your meals?: Family  Who does your laundry?: Family  Who picks up your prescriptions?: Family  Dressing: Unable to assess  Grooming: Unable to assess  Feeding: Unable to assess  Bathing: Unable to assess  Toileting: Unable to assess  Home Care/Community Services: None  Adult Scientist, forensic (APS) involved?: No  Discharge Planning  Support Systems: Family members  Patient expects to be discharged to:: TBD with family  Anticipated  plan discussed with:: Same as interviewed  Mode of transportation:: Other  Does the patient have perscription coverage?: Yes  Consults/Providers  PT Evaluation Needed: Yes (Comment)  OT Evalulation Needed: Yes (Comment)  SLP Evaluation Needed: Yes (Comment)  Correct PCP listed in Epic?: Yes  PCP  PCP on file was verified as the current PCP?: Yes   30 Day Readmission:       Provider Notifications:        Eusebio Me RN,BSN  Case Management  Phone number: [phone]

## 2018-02-06 NOTE — Progress Notes (Addendum)
Medicine Progress Note   [facility]   Patient Name: [name],[name]: 0 days   Attending Physician: Arman Filter, DO PCP: Berdine Dance Clarita Crane, MD      Hospital Course:                                                            Hendricks Milo Junior Vacha is a 78 y.o. male Ms. past medical history old stroke, CAD,diabetes mellitus, hypertension,stage 4 CKD and dementia who came in for extended for and skin laceration.  Patient has dementia patient cannot provide any medical information history obtained from the ER physician and the chart.  Patient was found to have possible tiny subdural hematoma.  Seen by neurosurgical service, recommend observation overnight and repeat a CT scan of the head if there is no hematoma patient can be discharged.  And hold on Plavix.  The scalp laceration been sutured. Repeat CT showed no evidence of bleed. Meds resumed. Placement pending.     Assessment and Plan:      Possible tiny acute subdural hematoma on the left, without mass effect  Due to accidental fall  No neuro deficit  Neurosurgery consulted recommend repeat a CT scan if CT scan no increase of hematoma patient can be discharged  Control blood pressure  Hold Plavix and an aspirin  --> normal repeat CT, can resume meds      Scalp laceration due to accidental fall  S/P suture  CT of the neck neck did not show to fracture or dislocation      Esophageal foreign body possible food or mass  This is detected on the CT of the neck  Will order CT of chest without contrast  --> normal CT      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  Hold aspirin and Plavix due to hematoma, continue statin  --> resume meds    Stage 4 CKD  Creatinine at baseline  Avoid unnecessary nephrotoxic medication  Recent renal ultrasound showed medical renal disease      Hypertension   Continue home BP medications  IV hydralazine for blood pressure greater than  160      Diabetesmellitus  Cont oral meds  Cont sliding scale insulin  Adding basal insulin for hyperglycemia        Single-vessel CAD continue aspirin statin       Acute Bilateral PE Dec 2019  No acute pulmonary issue  Resume Eliquis    Leukocytosis with possible aspiration PNA on CT  - cont zosyn        DVT PPx: Eliquis    Dispo: Observation -- family says cannot take care of him at home, PT/OT eval ordered, recs LTC    Healthcare Proxy: Family    Code: Full code   Subjective   Demented        Objective   Physical Exam:     Vitals: T:99.9 F (37.7 C) (Oral), BP:155/80, HR:78, RR:19, SaO2:96%    General: Patient is awake. In no acute distress.  HEENT: No conjunctival drainage, vision is intact, anicteric sclera.  Neck: Supple, no thyromegaly.  Chest: CTA bilaterally. No rhonchi, no wheezing. No use of accessory muscles.  CVS: Normal rate and regular rhythm no murmurs, without  JVD, no pitting edema, pulses palpable.  Abdomen: Soft, non-tender, no guarding or rigidity, with normal bowel sounds.  Extremities: No calf swelling and no gross deformity.  Skin: Warm, dry, no rash and no worrisome lesions.  NEURO: No motor or sensory deficits.  Psychiatric: Alert, interactive, appropriate, normal affect.  Weight Monitoring 11/28/2017 12/29/2017 12/29/2017 12/31/2017 01/01/2018 01/17/2018 01/23/2018   Height 167.6 cm - 167.6 cm - - 177.8 cm 177.8 cm   Weight 66.679 kg 64 kg 64 kg 63.2 kg 62.1 kg 64.91 kg 63.7 kg   Weight Method - Actual - Standing Scale Standing Scale - Bed Scale   BMI (calculated) 23.8 kg/m2 - 22.8 kg/m2 - - 20.6 kg/m2 20.2 kg/m2         Intake/Output Summary (Last 24 hours) at 02/06/2018 1434  Last data filed at 02/06/2018 1423  Gross per 24 hour   Intake 120 ml   Output 175 ml   Net -55 ml     Body mass index is 20.15 kg/m.     Meds:     Current Facility-Administered Medications   Medication Dose Route Frequency    amLODIPine  5 mg Oral QAM    apixaban  5 mg Oral Q12H [facility] [place]    clopidogrel  75  mg Oral QAM    finasteride  5 mg Oral QAM    glimepiride  1 mg Oral QAM AC    hydroCHLOROthiazide  12.5 mg Oral Daily    insulin lispro (1 Unit Dial)  1-9 Units Subcutaneous TID AC    And    insulin lispro (1 Unit Dial)  1-7 Units Subcutaneous QHS    lactobacillus species  50 Billion CFU Oral Daily    lisinopril  20 mg Oral Daily    piperacillin-tazobactam  2.25 g Intravenous Q6H    simvastatin  20 mg Oral QHS    sodium bicarbonate  650 mg Oral BID    sodium chloride (PF)  3 mL Intravenous Q8H    vitamin D  25 mcg Oral QAM      sodium chloride 50 mL/hr at 02/04/18 1614     PRN Meds: dextrose, glucagon (rDNA), hydrALAZINE, NSG Communication: Glucose POCT order (AC, HS) **AND** insulin lispro (1 Unit Dial) **AND** insulin lispro (1 Unit Dial) **AND** insulin lispro (1 Unit Dial), naloxone, ondansetron.     LABS:     Estimated Creatinine Clearance: 19.9 mL/min (A) (based on SCr of 2.85 mg/dL (H)).  Recent Labs   Lab 02/06/18  0344 02/05/18  0336   WBC 16.1* 17.3*   RBC 4.25 4.00   Hemoglobin 13.0 12.0*   Hematocrit 40.0 37.2*   MCV 94 93   PLT CT 162 175     Recent Labs   Lab 01/19/2018  1300   PT 12.3*   PT INR 1.2     Recent Labs   Lab 01/20/2018  1300   Creatine Kinase (CK) 375*     Lab Results   Component Value Date    HGBA1CPERCNT 5.7 02/02/2018     Recent Labs   Lab 02/06/18  0344 02/05/18  0336 02/04/18  0339   Glucose 223* 174* 152*   Sodium 136 132* 135*   Potassium 4.4 4.6 4.9   Chloride 105 102 105   CO2 19* 19* 23   BUN 42* 41* 27*   Creatinine 2.85* 2.57* 2.30*   EGFR 24* 27* 31*   Calcium 8.6 8.5 9.2         Recent  Labs   Lab 01/29/2018  1839   Specific Gravity, UR 1.011   pH, Urine 7.0   Protein, UR Negative   Glucose, UA >=500*   Ketones UA 5   Bilirubin, UA Negative   Blood, UA Negative   Nitrite, UA Negative   Urobilinogen, UA Normal   Leukocyte Esterase, UA Negative   RBC, UA 1      Patient Lines/Drains/Airways Status    Active PICC Line / CVC Line / PIV Line / Drain / Airway / Intraosseous  Line / Epidural Line / ART Line / Line / Wound / Pressure Ulcer / NG/OG Tube     Name:   Placement date:   Placement time:   Site:   Days:    Peripheral IV 02/04/18 Anterior;Left Upper Arm   02/04/18    1610    Upper Arm   less than 1               Ct Head Wo Contrast    Result Date: 02/04/2018  1. Previous possible tiny acute left subdural hematoma no longer well visualized. No enlarging extra-axial fluid collections. 2. 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 No significant change or mass effect. 3. Frontal superficial injury with swelling and subcutaneous gas. ReadingStation:[email or web address]    Ct Head Wo- (rad Read)    Result Date: 01/18/2018  1.  Possible tiny acute subdural hematoma on the left, without mass effect: Dr. Norman Clay was informed of this by telephone at 1354 hours on 03 February 2018. 2.  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 3.  Other chronic intracranial changes as above. 4.  Frontal scalp contusion. ReadingStation:[email or web address]    Ct Chest Wo Contrast    Result Date: 01/29/2018  1. No radiopaque foreign body within the esophagus, previously noted mass or ingested material in the upper thoracic esophagus has resolved. 2. Nodular infiltrate of the right lung base along the dependent aspect likely the basis of aspiration pneumonia. Would recommend follow-up to resolution. 3. Evidence for granulomatous disease. There is a 3 mm nonspecific right upper lobe nodule. Would recommend a 3-6 month follow-up to evaluate stability. 4. Multiple ill-defined hypodensities within the liver. These are nonspecific however metastatic lesions cannot be excluded. Would recommend dedicated contrasted triphasic CT of the abdomen(include delayed postcontrast evaluation), or contrasted MRI for more definitive evaluation. ReadingStation:[email or web address]    Ct Cervical Spine Wo    Result Date:  01/19/2018  No fractures are identified. Multilevel degenerative facet disease. There is at least mild spinal stenosis at C3-4 and C4-5, and moderate spinal stenosis at C5-6. Some food or a mass is visible in the upper thoracic esophagus, filling the lumen. ReadingStation:[email or web address]    Xr Chest Ap Portable    Result Date: 02/02/2018  No acute findings. [place] Needs:  There are no questions and answers to display.       Nutrition assessment done in collaboration with Registered Dietitians:     Time spent:      Arman Filter, DO     02/06/18,2:34 PM   MRN: [id number]                                      CSN: [id number] DOB: 1940-08-19

## 2018-02-06 NOTE — Progress Notes (Signed)
This is a recommendation based on chart review only.      Antimicrobial Stewardship Note      [name] is a 78 y.o. admitted with accidental fall with a skin laceration and possible tiny subdural hematoma     PMH: CAD, DM, HTN, stage 4 CKD, dementia, previous stroke    D3 Zosyn for possible aspiration pneumonia. WBC elevated, 24H Tmax 99.52F. Currently on room air.     Micro  1/25 Flu: neg  1/26 MRSA nares: neg    Imaging  1/24 CXR: No acute findings  1/24 CT cervical spine: No fractures are identified. Some food or a mass is visible in the upper thoracic esophagus,  1/24 CT head: Possible tiny acute subdural hematoma on the left, without mass effect  1/24 CT chest: No radiopaque foreign body within the esophagus, previously noted mass or ingested material in the upper thoracic esophagus has resolved. 2. Nodular infiltrate of the right lung base along the dependent aspect likely the basis of aspiration pneumonia. Would recommend follow-up to resolution.       Recommendation:     1. Recommend de-escalation to ceftriaxone, as per CAP guidelines:    Question 10: In the Inpatient Setting, Should Patients with Suspected Aspiration Pneumonia Receive Additional Anaerobic Coverage beyond Standard Empiric Treatment for CAP?  Recommendation  We suggest not routinely adding anaerobic coverage for suspected aspiration pneumonia unless lung abscess or empyema is suspected (conditional recommendation, very low quality of evidence).         Objective:     No Known Allergies     Height: 1.778 m (5\' 10" )  Weight:  63.7 kg (140 lb 6.9 oz)    Temp (24hrs), Avg:99.1 F (37.3 C), Min:98.2 F (36.8 C), Max:99.9 F (37.7 C)      Lab Results   Component Value Date    CREAT 2.85 (H) 02/06/2018        Lab Results   Component Value Date    WBC 16.1 (H) 02/06/2018          Microbiology Results     Procedure Component Value Units Date/Time    MRSA Detection by DNA Amp. (Nares) [[id number]] Collected:  02/05/18 1830    Specimen:   Nares Updated:  02/05/18 1949     MRSA No MRSA detected.     Comment: The above 1 analytes were performed by [facility] [phone])  [address],[place], [address]         Nasal Wash Influenza A and B Rapid Test [[id number]] Collected:  02/04/18 1552    Specimen:  Nasal Wash Updated:  02/04/18 1625     Influenza A Negative     Influenza B Negative     Comment: Method: Collier Bullock    The sensitivity for this method is between 90% and 95% for Influenza A and around 90% for Influenza B. The specificity for both Influenza A and B is around 96%. False positive results may occur, especially when the prevalence of Influenza activity is low. This is more likely with Influenza B due to its lower prevalence. Clinical conditions, including the prevalence of influenza activity, should be considered in the interpretation of results. If clinically indicated, results may be confirmed with PCR testing.  The above 2 analytes were performed by [facility] [phone])  [address] [address]         Narrative:       Influenza A antigen detection tests are  unable to distinquish between novel and seasonal influenza A.    A negative result for either Influenza A or B antigen does not exclude influenza virus infection. Clinical correlation required.    All positive influenza antigen tests (A or B) require placement of patient on droplet precaution isolation.                  Thanks,   The Antimicrobial Stewardship Team   Hollie Salk, PharmD Phone# [phone]  Hardin Negus, MD, Pager# [phone]

## 2018-02-06 NOTE — Progress Notes (Signed)
Patient vomiting. MD called and order for Zofran placed. Zofran given. Suction set up. Patient given bath and oral care preformed.

## 2018-02-06 NOTE — OT Eval Note (Signed)
Thank you for allowing [place] to participate in the care of  [name].  Regulations from the [place] for Medicare and Medicaid Services ([employer]) require your review and approval for this Plan of Care.  Please co-sign this note indicating you are in agreement with the Occupational Therapy Plan of Care.    [facility]: [facility]  Department of Rehabilitation Services: [phone]    [place]    CSN#: [id number]  [facility] [facility] 4505/4505    Occupational Therapy Evaluation    Consult received for [name] for OT Evaluation and Treatment.  Patients medical condition is appropriate for Occupational therapy intervention at this time.    Time of treatment:   Time Calculation  OT Received On: 02/06/18  Start Time: 1116  Stop Time: 1141  Time Calculation (min): 25 min    Visit#: 1    Precautions and Contraindications:   Falls  Mobility protocol   Patient is nonverbal - does best with demonstrated simple cues and automatic movements  Patient's family prefers patient to have shoes donned during mobility.     OT Assessment/Clinical Decision Making:      [name] is a 78 y.o. male admitted 01/12/2018 presenting after fall and striking head at home. Patient diagnosed with possible tiny left frontal SDH, however, not appreciated on imaging by Dr. Willette Alma at time of arrival to [facility] Repeat CT head shows no evidence of Subdural Hematoma or acute intracranial injury.    Patient with past medical history significant for CVA with right sided deficits and dementia. Patient is nonverbal.    During today's evaluation, niece was present to provide PLOF information and reports that she "cannot take him home unless he is back to normal". Per niece, normal includes him "getting himself dressed, walking around with a cane, and not needing me to pull on him". Patient was essentially independent except for bathing.     At this time, patient displaying the following impairments: decreased  strength, balance deficits, decreased activity tolerance, decrease safety awareness, fall risk and judgment impairment. These impairments are affecting the patient's ability to perform in needed/desired occupations resulting in performance deficits in bathing/showering, toileting & toilet hygiene, dressing, functional mobility/transfers and personal hygiene & grooming. Would benefit from continued occupational therapy services for above noted impairments.     Rehabilitation Potential: Fair secondary to dementia With family Ongoing OT assessment needed     Risks/benefits/POC discussed: Patient, Family (neice)    Plan:   Treatment/interventions: ADL retraining, functional transfer training, activity pacing, cognition (safety awareness, problem solving, etc.), patient/family training, equipment eval/education, compensatory technique education, continued evaluation    Treatment Frequency: OT Frequency Recommended: 2-3x/wk    Goals:   In 3 to 4 visits:  1. Patient will donn/doff socks with Moderate assist and use of AE/AT prn. NEW  2. Patient will thread pants/undergarments over knees with Maximal assist and use of AE/AT/AD prn. NEW  3. Patient will stand and arrange pants/undergarments over hips with Maximal assist and use of AE/AT/AD prn.  NEW  4. Patient will donn/doff shirt/gown with Moderate assist and use of AT/AE prn. NEW  5. Patient will perform BSC/chair transfer with Maximal assist (assist x2 for safety) and use of AD/DME prn. NEW  6. Patient will perform 2 grooming tasks with Supervision after set-up assistance while seated at EOB and use of AE/AT prn. NEW    LTG (by d/c):   1. Patient will be Moderate assist for ADLs, Moderate  assist for functional transfers/mobility with use of AD/DME/AT/AE. NEW       DISCHARGE RECOMMENDATIONS   DME recommended for Discharge:   TBD closer to d/c  TBD at next level of care    Discharge Recommendations:   SNF;Other(Comment)(potential need to progress to LTC)   Pending medical  progress  Pending progress in next therapy session    Medical & Therapy History:   Medical Diagnosis: Renal insufficiency [N28.9]  History of CVA (cerebrovascular accident) [Z86.73]  Scalp laceration, initial encounter [S01.01XA]  Fall at home, initial encounter [W19.XXXA, N17.001]  Dementia without behavioral disturbance, unspecified dementia type [F03.90]  Leukocytosis, unspecified type [D72.829]  Traumatic subdural hematoma without loss of consciousness, initial encounter [S06.5X0A]  Type 2 diabetes mellitus with hyperglycemia, unspecified whether long term insulin use [E11.65]    [name] is a 78 y.o. male admitted on 02/06/2018 after fall and striking head at home. Patient diagnosed with possible tiny left frontal SDH, however, not appreciated on imaging by Dr. Willette Alma at time of arrival to [facility]. Repeat CT head shows no evidence of Subdural Hematoma or acute intracranial injury.Marland Kitchen    X-Rays/Tests/Labs:  Ct Head Wo Contrast    Result Date: 02/04/2018  1. Previous possible tiny acute left subdural hematoma no longer well visualized. No enlarging extra-axial fluid collections. 2. 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 No significant change or mass effect. 3. Frontal superficial injury with swelling and subcutaneous gas. ReadingStation:[email or web address]    Ct Head Wo- (rad Read)    Result Date: 01/28/2018  1.  Possible tiny acute subdural hematoma on the left, without mass effect: Dr. Norman Clay was informed of this by telephone at 1354 hours on 03 February 2018. 2.  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 3.  Other chronic intracranial changes as above. 4.  Frontal scalp contusion. ReadingStation:[email or web address]    Ct Chest Wo Contrast    Result Date: 01/20/2018  1. No radiopaque foreign body within the esophagus, previously noted mass or ingested material in the upper thoracic  esophagus has resolved. 2. Nodular infiltrate of the right lung base along the dependent aspect likely the basis of aspiration pneumonia. Would recommend follow-up to resolution. 3. Evidence for granulomatous disease. There is a 3 mm nonspecific right upper lobe nodule. Would recommend a 3-6 month follow-up to evaluate stability. 4. Multiple ill-defined hypodensities within the liver. These are nonspecific however metastatic lesions cannot be excluded. Would recommend dedicated contrasted triphasic CT of the abdomen(include delayed postcontrast evaluation), or contrasted MRI for more definitive evaluation. ReadingStation:[email or web address]    Ct Cervical Spine Wo    Result Date: 01/16/2018  No fractures are identified. Multilevel degenerative facet disease. There is at least mild spinal stenosis at C3-4 and C4-5, and moderate spinal stenosis at C5-6. Some food or a mass is visible in the upper thoracic esophagus, filling the lumen. ReadingStation:[email or web address]    Xr Chest Ap Portable    Result Date: 01/29/2018  No acute findings. ReadingStation:[email or web address]    Discussed with patient/family/caregiver the patient's physical, cognitive and/or psychosocial history related to current functional performance: yes    Previous therapy services: Unknown    Patient Active Problem List   Diagnosis    Glaucoma, unspecified glaucoma type, unspecified laterality    Pure hypercholesterolemia    Essential hypertension    Type 2 diabetes mellitus with stage 3 chronic kidney disease, without long-term current use of insulin    History  of stroke with residual deficit    Bradycardia    Heme positive stool    Nonverbal    Benign prostatic hyperplasia with urinary hesitancy    Microalbuminuria    Altered mental status    Bilateral pulmonary embolism    Fall (on) (from) other stairs and steps, initial encounter    Stroke        Past Medical/Surgical History:  Past Medical History:   Diagnosis Date    Cataracts, bilateral     Diabetes mellitus      Hypertension     Kidney disorder     stage IV kidney disease    Stroke       Past Surgical History:   Procedure Laterality Date    EYE SURGERY      LUNG LOBECTOMY      right eye transplant      unsuccessful         Occupational Profile:   Home Living Arrangements  Living Arrangements: Neice  Assistance Available: Full time   Type of Home: House  Home Layout: Two level, with 2 stair(s) to enter  Bathroom:  standard toilet;  walk-in-shower, neice helps with bathing  DME Currently at Home: Single point cane    Prior Level of Function  Mobility: Household ambulation, with SPT cane  Fall History: 1 fall related to this admission     Activities of Daily Living  Feeding: Independent  Grooming: Independent  UB dressing: Independent  LB dressing: Independent  Bathing: Moderate assist  Toileting: Independent    Instrumental Activities of Daily Living  Patient was dependent for all IADLs at baseline.    Subjective:   Patient is nonverbal  Patient is agreeable to participation in the therapy session. Nursing clears patient for therapy.    Pain:  At Rest: 0 /10  With Activity: 0/10  Location: N/A  Interventions: None required    ASSESSMENT OF OCCUPATIONAL PERFORMANCE:   Observation of Patient:    Patient is in bed with telemetry, peripheral IV            Vital Signs:   Stable with no signs/symptoms of distress     Oriented to: Unable to assess due to nonverbal  Command following: Follows 1 step commands with increased time, with demonstration, 50% of the time  Alertness/Arousal: Delayed responses to stimuli   Attention Span:Attends to task with redirection  Memory: Appears intact, UTA=Unable to assess  Safety Awareness: UTA=unable to assess  Insights: Decreased awareness of deficits  Problem Solving: UTA=unable to assess    Behavior: Cooperative    Musculoskeletal Examination:   Range of motion:  Right UE: contracted from previous stroke  Left UE: Grossly WFL       Strength:  Right UE: not tested - contracted in elbow  flexion from previous stroke  Left UE: Grossly WFL         Sensory/Oculomotor Examination:   Auditory:  WFL=intact  Tactile-Light Touch:  Unable to assess  Visual Acuity:  Unable to assess    Patient is nonverbal    Activities of Daily Living:   Eating:  Supervision/Set up; , seated at edge of bed; , beverage management  Grooming: Moderate assist, Simulated, seated at edge of bed  UB dressing:  Maximal assist, Simulated, seated at edge of bed  LB dressing: Total assist: unable to perform task due to current impairments  Bathing:  Total assist: unable to perform task due to current impairments;  Toileting:  Total assist: unable  to perform task due to current impairments;         Functional Mobility:   Bed Mobility:  Supine scooting:  Maximal assist (assist x2 for safety)   Supine to Sit:   Moderate assist.   Cues for Sequencing., HOB elevated, assist at trunk, assist at LE(s)  Sit to Supine:   Maximal assist.   assist at LE(s)  Seated Scooting:   Minimal assist    Transfers:  Sit to Stand:  Maximal assist with Single point cane.   Cues for Sequencing, Cues for Hand Placement; posterior lean and poor standing balance; difficulty following cues  Stand to Sit:  Maximal assist.          Balance:   Static Sitting:  Fair+  Dynamic Sitting:  Fair  Static Standing:  Poor-    Participation and Activity Tolerance   Participation effort: Good  Activity Tolerance: Tolerates 10-20 minutes of activity with multiple rests    Other Treatment Interventions:   Treatment Activities:   Not applicable          Education Provided:   Topics: Role of occupational therapy, plan of care, goals of therapy and safety with mobility and ADLs, benefits of activity, home safety, bed mobility with use of adaptive equipment or strategy, activity with nursing.    Individuals educated: Patient, Family.  Method: Explanation.  Response to education: Needs reinforcement.    Team Communication:   OT communicated with: RN/LPN - Wells Guiles  OT communicated  regarding: Pre-session re: patient status, Patient position at end of session, Discharge needs, Patient participation with Therapy, Vital signs, Further recommendations  OT/COTA communication: via written note and verbal communication as needed.    Supine, in bed, in the room, Family/visitors present, Needs in reach, Bed/chair alarm set, No distress and Affected Extremity elevated    Recommend client be assisted to sit at EOB as least once a day with assist x1 person outside of and in addition to OT session.    [place] Jennilyn Esteve, OTR/L

## 2018-02-06 NOTE — UM Notes (Signed)
[facility] Utilization Management Review Sheet    Facility: [facility]    Name: [name]    DOB: 1940-12-20     MRN: [id number]     CSN: [id number]     Time and date of admission: 01/29/2018 12:46 PM    Patient Class:  Place (admit) for Observation Services [[id number]]     Electronically signed by: Salome Arnt, MD on 02/07/2018 1912     Payor: Payor: MEDICARE HMO / Plan: AN[employer]M MEDICARE HMO/PPO / Product Type: MANAGED MEDICARE /     ID# [id number]    Auth#     Diagnosis:     ICD-10-CM    1. Fall at home, initial encounter W19.XXXA     Y92.009    2. Traumatic subdural hematoma without loss of consciousness, initial encounter S06.5X0A    3. Scalp laceration, initial encounter S01.01XA    4. Dementia without behavioral disturbance, unspecified dementia type F03.90    5. History of CVA (cerebrovascular accident) Z86.73    6. Renal insufficiency N28.9    7. Leukocytosis, unspecified type D72.829    8. Type 2 diabetes mellitus with hyperglycemia, unspecified whether long term insulin use E11.65         Date of service reviewed: 01/11/2018     MCG criteria set- Sent to [employer]    HPI: "[name] is a 78 y.o. male Ms. past medical history old stroke, CAD,diabetes mellitus, hypertension,stage 4 CKD and dementia who came in for extended for and skin laceration.  Patient has dementia patient cannot provide any medical information history obtained from the ER physician and the chart.  Patient was found to have possible tiny subdural hematoma.  Seen by neurosurgical service, recommend observation overnight and repeat a CT scan of the head if there is no hematoma patient can be discharged.  And hold on Plavix.  The scalp laceration been sutured." per H&P      ED Vitals & Treatment:    LABS:    MEDS:      ASSESSMENT & PLAN    Medicine admit note:  Assessment and Plan:                                                                Possible tiny acute subdural hematoma on the left, without mass effect  Due  to accidental fall  No neuro deficit  Neurosurgery consulted recommend repeat a CT scan if CT scan no increase of hematoma patient can be discharged  Control blood pressure  Hold Plavix and an aspirin    Scalp laceration due to accidental fall  S/P suture  CT of the neck neck did not show to fracture or dislocation    Esophageal foreign body possible food or mass  This is detected on the CT of the neck  Will order CT of chest without contrast    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  Hold aspirin and Plavix due to hematoma, continue statin    Stage 4 CKD  Creatinine at baseline  Avoid unnecessary nephrotoxic medication  Recent renal ultrasound showed medical renal disease    Hypertension   Continue home BP medications  IV hydralazine for blood pressure greater than 160    Diabetesmellitus  hold oral hypoglycemics  Add sliding scale insulin    Single-vessel CAD continue aspirin statin     Acute Bilateral PE  No acute pulmonary issue  Hold Eliquis    DVT PPx: Contraindicated because of hematoma    Dispo: Observation       Admit Observation:         DOS 02/04/18:    Medicine note:  Assessment and Plan:      Possible tiny acute subdural hematoma on the left, without mass effect  Due to accidental fall  No neuro deficit  Neurosurgery consulted recommend repeat a CT scan if CT scan no increase of hematoma patient can be discharged  Control blood pressure  Hold Plavix and an aspirin  --> normal repeat CT, can resume meds    Scalp laceration due to accidental fall  S/P suture  CT of the neck neck did not show to fracture or dislocation    Esophageal foreign body possible food or mass  This is detected on the CT of the neck  Will order CT of chest without contrast  --> normal CT    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  Hold aspirin and Plavix due to hematoma,continuestatin  --> resume meds    Stage 4 CKD   Creatinine at baseline  Avoid unnecessary nephrotoxic medication  Recent renal ultrasound showed medical renal disease    Hypertension  Continue home BP medications  IV hydralazine for blood pressure greater than 160    Diabetesmellitus  hold oral hypoglycemics  Add sliding scale insulin    Single-vessel CAD continue aspirin statin    Acute Bilateral PE Dec 2019  No acute pulmonary issue  Resume Eliquis    Leukocytosis with possible aspiration PNA on CT  - zosyn    DVT BJY:NWGNFAO    Dispo:Observation -- family says cannot take care of him at home, PT/OT eval ordered    Subjective "demented"    Vitals: T:98.1 F (36.7 C) (Oral), BP:174/79, HR:(!) 101, RR:16, SaO2:96%       DOS 02/05/18 - 02/06/18    Continues on IV zosyn    02/05/18 1620 -- 98.2 F (36.8 C) Oral 102 98 % -- 16 154/70     02/06/18 1515 -- 99.9 F (37.7 C) Oral 108 96 % -- 17 157/72     Medicine note:  Assessment and Plan:      Possible tiny acute subdural hematoma on the left, without mass effect  Due to accidental fall  No neuro deficit  Neurosurgery consulted recommend repeat a CT scan if CT scan no increase of hematoma patient can be discharged  Control blood pressure  Hold Plavix and an aspirin  --> normal repeat CT, can resume meds      Scalp laceration due to accidental fall  S/P suture  CT of the neck neck did not show to fracture or dislocation    Esophageal foreign body possible food or mass  This is detected on the CT of the neck  Will order CT of chest without contrast  --> normal CT    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  Hold aspirin and Plavix due to hematoma,continuestatin  --> resume meds    Stage 4 CKD  Creatinine at baseline  Avoid unnecessary nephrotoxic medication  Recent renal ultrasound showed medical renal disease    Hypertension  Continue home BP medications  IV hydralazine for blood pressure greater  than 160    Diabetesmellitus  hold oral hypoglycemics   Add sliding scale insulin    Single-vessel CAD continue aspirin statin    Acute Bilateral PE Dec 2019  No acute pulmonary issue  Resume Eliquis    Leukocytosis with possible aspiration PNA on CT  - cont zosyn       Scheduled Meds:  Current Facility-Administered Medications   Medication Dose Route Frequency    amLODIPine  5 mg Oral QAM    apixaban  5 mg Oral Q12H [facility]    clopidogrel  75 mg Oral QAM    finasteride  5 mg Oral QAM    glimepiride  1 mg Oral QAM AC    hydroCHLOROthiazide  12.5 mg Oral Daily    insulin glargine  7 Units Subcutaneous QHS    insulin lispro (1 Unit Dial)  1-9 Units Subcutaneous TID AC    And    insulin lispro (1 Unit Dial)  1-7 Units Subcutaneous QHS    lactobacillus species  50 Billion CFU Oral Daily    lisinopril  20 mg Oral Daily    piperacillin-tazobactam  2.25 g Intravenous Q6H    simvastatin  20 mg Oral QHS    sodium bicarbonate  650 mg Oral BID    sodium chloride (PF)  3 mL Intravenous Q8H    vitamin D  25 mcg Oral QAM     Continuous Infusions:   sodium chloride 50 mL/hr at 02/04/18 1614       Gabriela Eves, RN BSN  Utilization Review Nurse  Utilization Management  [facility]  [address]  [place], [place] [address]  Phone: [phone], direct/confidential  Fax: [phone]  [email or web address]

## 2018-02-06 NOTE — Plan of Care (Signed)
NURSE NOTE SUMMARY  [facility]   Patient Name: [name]   Attending Physician: Arman Filter, DO   Today's date:   02/06/2018 LOS: 0 days   Shift Summary:                                                              Patient resting comfortably in bed. No complaints of pain noted. Patient nonverbal and makes gestures/ nods head. IV in left upper arm flushed and functional. IVFs running. Urinal within reach. No needs noted. Will continue to monitor.    Provider Notifications:      Rapid Response Notifications:  Mobility:      PMP Activity: Step 3 - Bed Mobility (02/05/2018  7:51 PM)     Weight tracking:  Family Dynamic:   Last 3 Weights for the past 72 hrs (Last 3 readings):   Weight   01/19/2018 2000 63.7 kg (140 lb 6.9 oz)             Recent Vitals Last Bowel Movement   BP: 158/89 (02/05/2018 10:53 PM)  Heart Rate: 61 (02/05/2018 10:53 PM)  Temp: 99.1 F (37.3 C) (02/05/2018 10:53 PM)  Resp Rate: 17 (02/05/2018 10:53 PM)  SpO2: 93 % (02/05/2018 10:53 PM)   Last BM Date: 02/02/2018

## 2018-02-06 NOTE — Plan of Care (Signed)
Problem: Moderate/High Fall Risk Score >5  Goal: Patient will remain free of falls  Outcome: Progressing  Flowsheets (Taken 02/06/2018 0817)  VH High Risk (Greater than 13): ALL REQUIRED LOW INTERVENTIONS;ALL REQUIRED MODERATE INTERVENTIONS;RED "HIGH FALL RISK" SIGNAGE;BED ALARM WILL BE ACTIVATED WHEN THE PATEINT IS IN BED WITH SIGNAGE "RESET BED ALARM";A CHAIR PAD ALARM WILL BE USED WHEN PATIENT IS UP SITTING IN A CHAIR;PATIENT IS TO BE SUPERVISED FOR ALL TOILETING ACTIVITIES     Problem: Compromised Tissue integrity  Goal: Nutritional status is improving  Outcome: Progressing  Flowsheets (Taken 02/06/2018 1020)  Nutritional status is improving: Assist patient with eating; Allow adequate time for meals; Encourage patient to take dietary supplement(s) as ordered; Include patient/patient care companion in decisions related to nutrition

## 2018-02-07 ENCOUNTER — Observation Stay: Payer: Medicare Other

## 2018-02-07 DIAGNOSIS — S065XAA Traumatic subdural hemorrhage with loss of consciousness status unknown, initial encounter: Secondary | ICD-10-CM | POA: Diagnosis present

## 2018-02-07 LAB — BASIC METABOLIC PANEL
Anion Gap: 17 mMol/L (ref 7.0–18.0)
BUN / Creatinine Ratio: 16.7 Ratio (ref 10.0–30.0)
BUN: 53 mg/dL — ABNORMAL HIGH (ref 7–22)
CO2: 21 mMol/L (ref 20–30)
Calcium: 8.1 mg/dL — ABNORMAL LOW (ref 8.5–10.5)
Chloride: 102 mMol/L (ref 98–110)
Creatinine: 3.17 mg/dL — ABNORMAL HIGH (ref 0.80–1.30)
EGFR: 21 mL/min/{1.73_m2} — ABNORMAL LOW (ref 60–150)
Glucose: 222 mg/dL — ABNORMAL HIGH (ref 71–99)
Osmolality Calculated: 293 mOsm/kg (ref 275–300)
Potassium: 4 mMol/L (ref 3.5–5.3)
Sodium: 136 mMol/L (ref 136–147)

## 2018-02-07 LAB — VH URINALYSIS WITH MICROSCOPIC AND CULTURE IF INDICATED
Bilirubin, UA: NEGATIVE
Glucose, UA: NEGATIVE mg/dL
Granular Casts, UA: 5 /lpf — ABNORMAL HIGH (ref 0–0)
Ketones UA: NEGATIVE mg/dL
Leukocyte Esterase, UA: NEGATIVE Leu/uL
Nitrite, UA: NEGATIVE
Protein, UR: 30 mg/dL — AB
RBC, UA: 2 /hpf (ref 0–4)
Urine Specific Gravity: 1.019 (ref 1.001–1.040)
Urobilinogen, UA: NORMAL mg/dL
WBC, UA: 4 /hpf (ref 0–4)
pH, Urine: 5 pH (ref 5.0–8.0)

## 2018-02-07 LAB — CBC AND DIFFERENTIAL
Bands: 1 % (ref 0–10)
Basophils %: 0 % (ref 0.0–3.0)
Basophils Absolute: 0 10*3/uL (ref 0.0–0.3)
Eosinophils %: 0 % (ref 0.0–7.0)
Eosinophils Absolute: 0 10*3/uL (ref 0.0–0.8)
Hematocrit: 38 % — ABNORMAL LOW (ref 39.0–52.5)
Hemoglobin: 12.4 gm/dL — ABNORMAL LOW (ref 13.0–17.5)
Lymphocytes Absolute: 0.7 10*3/uL (ref 0.6–5.1)
Lymphocytes: 4 % — ABNORMAL LOW (ref 15.0–46.0)
MCH: 31 pg (ref 28–35)
MCHC: 33 gm/dL (ref 32–36)
MCV: 94 fL (ref 80–100)
MPV: 9.4 fL (ref 6.0–10.0)
Monocytes Absolute: 0.5 10*3/uL (ref 0.1–1.7)
Monocytes: 3 % (ref 3.0–15.0)
Neutrophils %: 92 % — ABNORMAL HIGH (ref 42.0–78.0)
Neutrophils Absolute: 16.2 10*3/uL — ABNORMAL HIGH (ref 1.7–8.6)
PLT CT: 197 10*3/uL (ref 130–440)
RBC: 4.04 10*6/uL (ref 4.00–5.70)
RDW: 12.1 % (ref 11.0–14.0)
WBC: 17.4 10*3/uL — ABNORMAL HIGH (ref 4.0–11.0)

## 2018-02-07 LAB — VH DEXTROSE STICK GLUCOSE
Glucose POCT: 245 mg/dL — ABNORMAL HIGH (ref 71–99)
Glucose POCT: 203 mg/dL — ABNORMAL HIGH (ref 71–99)
Glucose POCT: 169 mg/dL — ABNORMAL HIGH (ref 71–99)
Glucose POCT: 102 mg/dL — ABNORMAL HIGH (ref 71–99)
Glucose POCT: 185 mg/dL — ABNORMAL HIGH (ref 71–99)

## 2018-02-07 MED ORDER — PIPERACILLIN-TAZOBACTAM IN DEX 2-0.25 GM/50ML IV SOLN
2.25 g | Freq: Three times a day (TID) | INTRAVENOUS | Status: DC
Start: 2018-02-07 — End: 2018-02-15
  Administered 2018-02-07 – 2018-02-15 (×24): 2.25 g via INTRAVENOUS
  Filled 2018-02-07 (×26): qty 50

## 2018-02-07 MED ORDER — LANTUS SOLOSTAR 100 UNIT/ML SC SOPN
12.00 [IU] | PEN_INJECTOR | Freq: Every evening | SUBCUTANEOUS | Status: DC
Start: 2018-02-07 — End: 2018-02-15
  Administered 2018-02-07 – 2018-02-12 (×6): 12 [IU] via SUBCUTANEOUS
  Administered 2018-02-13: 21:00:00 5 [IU] via SUBCUTANEOUS
  Administered 2018-02-14: 22:00:00 12 [IU] via SUBCUTANEOUS

## 2018-02-07 NOTE — SLP Progress Note (Signed)
[facility]  Speech Language Pathology   Daily Progress Note    Patient: [name]    CSN#: [id number]    ASSESSMENT/PLAN/RECOMMENDATIONS:     SLP diagnosis:  Signs/Symptoms of Oropharyngeal Dysphagia complicated by esophageal component - plan for MBSS tomorrow, 1/29, to objectively assess swallow function    Diet recommendations:  Pureed diet, Moderately Thick (Honey thick liquids), Meds crushed in applesauce/pureed    Projected Discharge Recommendations:  To be determined pending medical course    Prognosis:  Fair d/t multiple medical complications, progressive disease    x Continue therapy for: pt/family training, Modified Barium Swallow study    Discontinue therapy      Goals:  Pt will:  STG:  (In 3-4 days)  1.    2. Pt will tolerate a PUREED diet with HONEY THICK liquids free of signs/symptoms of aspiration. (NEW)   3. Pt will participate in a Modified Barium Swallow Study (Cookie Swallow) to further assess swallowing and assist in making most appropriate diet recommendations and efficacy of swallowing strategies. (NEW)   4. Pt/family/staff education (ONGOING)      LTG: (By discharge)  1. Pt will tolerate the least restrictive diet free of signs/symptoms of aspiration. (ONGOING)      SUBJECTIVE:      Subjective: Patient is agreeable to participation in the therapy session. Patients medical condition is appropriate for Speech therapy intervention at this time.    S:   Pt nonverbal, though awake/alert upon SLP arrival.      OBJECTIVE:     O: SLP Treatment:  Pt was seen for SLP tx with progress as follows:   Pt positioned upright in bed upon SLP arrival with lunch tray present.  Significant coughing episode heard from outside of pt's room prior to SLP entering.  NSG reports pt with "gagging" when given pills in applesauce.  Pt seen for diet tolerance assessment.  Thin and nectar thick liquds via straw completed with inconsistent coughing post swallow.  Mechanical soft trials completed with  prolonged/effortful mastication; inconsistent coughing post swallow.  Puree and honey thick liquids tolerated better with no overt signs/symptoms of aspiration.  Pt unable to provide hx re: swallow function d/t nonverbal.  Spoke with pt/NSG re: recommend for Cookie Swallow d/t inconsistent signs/symptoms of aspiration complicated by signs/symptoms of an esophageal component (e.g. belching, CT cervical spine on admission demonstrated "some food or a mass is visible in the upper thoracic esophagus", though was not apparent on follow up CT chest.  CT chest demonstrated "right basilar nobular consolidation and atelectasis").  Recommend 1:1 supervision with PO intake - small bites/sips, slow rate, upright for all PO intake and >60 minutes following PO intake.       Pt/Family/Caregiver Education Provided:     Patient was/were educated re: role of slp, purpose of evaluation, sign/symptoms/risks of aspiration, safe feeding strategies, results/recommendations of today's evaluation, tx goals  Good understanding was verbalized/demonstrated: incomplete  Comprehension limited by: impaired cognitive-communication skills    Team Communication:     Spoke to : RN/LPN - Lucila Maine  Regarding: results/recommendations of today's evaluation  Good understanding was verbalized: yes                          Time of treatment:   SLP Received On: 02/07/18  Start Time: 1234  Stop Time: 4401  Time Calculation (min): 21 min    SLP Visit Number: 2      Completed  by:  Garlan Fair, M.S., [place]  Speech-Language Pathologist, [place] via [place], [place] Phone [phone], or Pager [phone]

## 2018-02-07 NOTE — Plan of Care (Addendum)
NURSE NOTE SUMMARY  [facility] - [facility] [place]   Patient Name: [name]   Attending Physician: Arman Filter, DO   Today's date:   02/07/2018 LOS: 0 days   Shift Summary:                                                              Patient turned and repositioned throughout the day- nonverbal- follows minimal commands- moves left side purposefully- family in to visit and updated on plan of care- assessed by speech therapist and diet changed to pureed with honey thick liquids- recommend putting meds crushed in pudding- applesauce seems to make patient gag- will have barium swallow tomorrow- renal ultrasound completed- bed alarm on- will monitor.    Provider Notifications:      Rapid Response Notifications:  Mobility:      PMP Activity: Step 4 - Dangle at Bedside (02/07/2018  3:00 PM)     Weight tracking:  Family Dynamic:   No data found.          Recent Vitals Last Bowel Movement   BP: 135/60 (02/07/2018  3:52 PM)  Heart Rate: (!) 59 (02/07/2018  3:52 PM)  Temp: 99.9 F (37.7 C) (02/07/2018  3:52 PM)  Resp Rate: 17 (02/07/2018  3:52 PM)  SpO2: 96 % (02/07/2018  3:52 PM)   Last BM Date: 02/02/2018       Problem: Neurological Deficit  Goal: Neurological status is stable or improving  Outcome: Progressing  Flowsheets (Taken 02/07/2018 1521)  Neurological status is stable or improving: Monitor/assess/document neurological assessment (Stroke: every 4 hours)     Problem: Potential for Aspiration  Goal: Risk of aspiration will be minimized  Outcome: Progressing  Flowsheets (Taken 02/07/2018 1521)  Risk of aspiration will be minimized : Assess and monitor ability to swallow; Monitor/assess for signs of aspiration (tachypnea, cough, wheezing, clearing throat, hoarseness after eating, decrease in SaO2); Order modified texture diet as recommended by Speech Pathologist; Thicken liquids as recommended by Speech Pathologist; Head of bed up 90 degrees to eat if unable to be out of bed; Consult/collaborate  with Speech Pathologist for dysphagia     Problem: Compromised Hemodynamic Status  Goal: Vital signs and fluid balance maintained/improved  Outcome: Progressing  Flowsheets (Taken 02/07/2018 1521)  Vital signs and fluid balance are maintained/improved: Monitor/assess vitals and hemodynamic parameters with position changes; Monitor/assess lab values and report abnormal values

## 2018-02-07 NOTE — PT Progress Note (Signed)
Physical Therapy PROGRESS note                             VHS: [facility]  Patient: [name] [place]     CSN: [id number]    Bed: 4505/4505    Visit#: 2   Treatment Frequency: 2-3x/wk  Last seen by a physical therapist vs. Physical therapist assistant: 02/07/2018    DISCHARGE RECOMMENDATIONS   Discharge Recommendations:   LTC       *Discharge recommendations are subject to change based on patient's progress and/or home support changes - please refer to most recent PT note for current recommendation    DME recommended for Discharge:   to be determined pending patient progress - patient will likely require a hospital bed     PMP (Progressive Mobility Program) Recommendations:   Recommend patient  change positions Q2 hours for pressure relief and sit up edge of bed with Ax2; progress mobility as tolerated with Ax2 for safety  as tolerated.     Precautions and Contraindications:   Falls  Mobility protocol   Patient is nonverbal - does best with demonstrated simple cues and automatic movements    Note: Patient's family prefers patient to have shoes donned during mobility.     PT Assessment and Plan of Care (Treatment frequency noted above):   HPI (per physician charting) and Pertinent Medical Details:    Admitted 02/02/2018 with fall with finding of possible tiny acute subdural hematoma on left without mass effect and with scalp laceration. Patient also with noted esophageal foreign body, possible food or mass, on CT of neck. Patient with past medical history significant for CVA with right residual deficits.     Goals:    LTGs: (By d/c)  1. Patient will perform bed mobility with minimal assistance in prep for out of bed activity. ONGOING  2. Patient will perform sit to stand transfers Minimal assistance (assist x2) in prep for gait. ONGOING  3. Patient will ambulate 10 feet with moderate assistance (assist x2) in prep for home mobility. ONGOING    PT Assessment:  Patients progress towards established  goals: Patient demonstrated no progress towards PT goals this date with patient requiring moderate assistance for bed mobility and maximal assistance for sit to stand/stand to sit transfers. Patient requires facilitation for participate with all mobility with minimal to no one step command following noted - patient responded bed to facilitation for mobility during session. Patient was able to take two shuffling sidesteps towards left side with maximal assistance and hand held assistance on left prior to initiating transfer to sitting then immediately transferring back to supine. PT was unable to redirect or facilitate patient to continue seated activities this date. Based upon patient's mobility, limited command following and questionable carryover of education provided during session, PT recommends long term care at discharge.      Treatment plan: Continue plan of care     Subjective:   Patient is non-verbal throughout session. Patient participated with functional mobility and exercises with facilitation from PT.    Patient/family/caregiver consent to therapy session is noted by the participation in the therapy session.    Pain:  At Rest: 0 /10 per PAINAD scale   With Activity: 2/10 per PAINAD scale   Location: Generalized  Interventions: RN/LPN aware     OBJECTIVE:   Observation of Patient/Vital Signs:   Patient is in bed with dressings, Bed/chair alarm on, peripheral IV  Patients medical condition is appropriate for Physical therapy intervention at this time.    Vital Signs:  BP Supine:  139/56 mmHg L forearm   HR Supine: 65 bpm  SpO2 at rest: 92% on RA            Oriented to: Unable to assess due to patient being non-verbal and patient not attempting to answer questions via head nods/shakes   Command following: patient did not demonstrate verbal command following during session, patient was able to participate with PT interventions with facilitation from PT   Alertness/Arousal: Delayed responses to stimuli,  Inconsistent responses to stimuli     Musculoskeletal and Balance Details:   Balance:  Static Sitting:  supervision to minimal assistance for sitting balance at edge of bed with increased posterior lean and left lateral lean which improved with facilitation and change in LUE positioning   Static Standing:  moderate to maximal assistance with LUE support; patient with increased posterior lean and flexed posture which intermittently improved with cues and facilitation           Bed Mobility:   Supine to Sit:   Moderate assist to the left with use of LUE on bed rail to assist; facilitation and assistance required for initiating transfer and for completion of transfer to sitting .        Sit to Supine:   Moderate assist to the left with head of bed flat and use of bed rail to assist; patient initiated transfer to supine with assistance required for completion of LE management and trunk control .        Seated Scooting:   Maximal assist for balance and hip advancement at edge of bed; patient with increased posterior lean during scoot which minimally improved with cues and facilitation     Transfers:  Sit to Stand:  Maximal assist with Hand held assist, on left, facilitaiton at trunk, gluts and LEs to assist with initiating and completing transfer to standing; performed x 2 reps; increased time required to complete transfer to standing due to patient stopping at approximately 50% of transfer initially then able to complete transfer with increased facilitation and assistance .         Stand to Sit:  Moderate assist to control descent to sitting; performed x 2 reps .           Locomotion:  LEVEL AMBULATION:  Side-stepping x 2 steps towards left with maximal assistance, hand held assistance on left and shuffling advancement of feet. Patient with increased posterior lean requiring facilitation and assistance for safety. Patient also required assistance/facilitation for weightshifting and LE advancement. Patient transferred to  sitting after second step and immediately initiated transfer back to supine - PT unable to redirect patient to continue to participate with seated activities.     Other Treatment Interventions this session:   Therapeutic exercise:  Sitting:  Long Arc Quads:  x10 reps bilaterally with AAROM - patient with inconsistent active participation with exercise    Heel/Toe raises:  x10 reps bilateraly with AAROm - patient with inconsistent active participation with exercises     Cues, demonstration and facilitation for exercise participation with minimal carryover   Therapeutic activity  Patient/family/caregiver education     Education Provided:   TOPICS: role of physical therapy, plan of care, goals of therapy and safety with mobility and ADLs, benefits of activity, activity with nursing, need for assistance at all times for mobility     Learner educated: Patient  Method: Explanation  and Demonstration  Response to education: Needs reinforcement and No evidence of learning    Patient Position at End of Treatment:   Supine, in bed, Needs in reach, Bed/chair alarm set, No distress and Heels floating    Team Communication:   Spoke to : RN/LPN - Erin  Regarding: Pre-session re: patient status, Patient position at end of session, Discharge needs, Patient participation with Therapy, Further recommendations  Whiteboard updated: No - appropriate information already on board   PT/PTA communication: via written note and verbal communication as needed.    Time of treatment:   Time Calculation  PT Received On: 02/07/18  Start Time: 0940  Stop Time: 0955  Time Calculation (min): 15 min    Elenor Quinones, PT, DPT

## 2018-02-07 NOTE — Progress Notes (Signed)
Medicine Progress Note   [facility]   Patient Name: [name],[name] JUNIOR LOS: 0 days   Attending Physician: Arman Filter, DO PCP: Berdine Dance Clarita Crane, MD      Hospital Course:                                                            [name] is a 78 y.o. male Ms. past medical history old stroke, CAD,diabetes mellitus, hypertension,stage 4 CKD and dementia who came in for extended for and skin laceration.  Patient has dementia patient cannot provide any medical information history obtained from the ER physician and the chart.  Patient was found to have possible tiny subdural hematoma.  Seen by neurosurgical service, recommend observation overnight and repeat a CT scan of the head if there is no hematoma patient can be discharged.  And hold on Plavix.  The scalp laceration been sutured. Repeat CT showed no evidence of bleed. Meds resumed. Placement pending as family states they cannot take care of him at home.    Has persistent leukocytosis despite zosyn, unclear etiology, possible aspiration. Also renal function worsening for no apparent reason despite fluids and normal renal/bladder US. Consider renal consult if continues to worsen.     Assessment and Plan:      Possible tiny acute subdural hematoma on the left, without mass effect  Due to accidental fall  No neuro deficit  Neurosurgery consulted recommend repeat a CT scan if CT scan no increase of hematoma patient can be discharged  Control blood pressure  Hold Plavix and an aspirin  --> normal repeat CT, can resume meds      Scalp laceration due to accidental fall  S/P suture  CT of the neck neck did not show to fracture or dislocation      Esophageal foreign body possible food or mass  This is detected on the CT of the neck  Will order CT of chest without contrast  --> normal CT      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  Hold aspirin and Plavix due to  hematoma, continue statin  --> resume meds    Stage 4 CKD  Creatinine uptrending despite fluids  Renal ultrasound showed medical renal disease  Cont monitor consider renal consult    Leukocytosis with possible aspiration PNA on CT  - cont zosyn    Hypertension   Continue home BP medications  IV hydralazine for blood pressure greater than 160      Diabetesmellitus  Cont oral meds  Cont sliding scale insulin  Added basal insulin for hyperglycemia        Single-vessel CAD continue aspirin statin       Acute Bilateral PE Dec 2019  No acute pulmonary issue  Resume Eliquis      DVT PPx: Eliquis    Dispo: Observation -- family says cannot take care of him at home, PT/OT eval ordered, recs LTC    Healthcare Proxy: Family    Code: Full code   Subjective   Demented        Objective   Physical Exam:     Vitals: T:99.5 F (37.5 C) (Oral), BP:101/58, HR:61, RR:22, SaO2:94%    General: Patient is awake. In  no acute distress.  HEENT: No conjunctival drainage, vision is intact, anicteric sclera.  Neck: Supple, no thyromegaly.  Chest: CTA bilaterally. No rhonchi, no wheezing. No use of accessory muscles.  CVS: Normal rate and regular rhythm no murmurs, without JVD, no pitting edema, pulses palpable.  Abdomen: Soft, non-tender, no guarding or rigidity, with normal bowel sounds.  Extremities: No calf swelling and no gross deformity.  Skin: Warm, dry, no rash and no worrisome lesions.  NEURO: No motor or sensory deficits.  Psychiatric: Alert, interactive, appropriate, normal affect.  Weight Monitoring 11/28/2017 12/29/2017 12/29/2017 12/31/2017 01/01/2018 01/17/2018 01/19/2018   Height 167.6 cm - 167.6 cm - - 177.8 cm 177.8 cm   Weight 66.679 kg 64 kg 64 kg 63.2 kg 62.1 kg 64.91 kg 63.7 kg   Weight Method - Actual - Standing Scale Standing Scale - Bed Scale   BMI (calculated) 23.8 kg/m2 - 22.8 kg/m2 - - 20.6 kg/m2 20.2 kg/m2         Intake/Output Summary (Last 24 hours) at 02/07/2018 2044  Last data filed at 02/07/2018  1300  Gross per 24 hour   Intake 200 ml   Output 303 ml   Net -103 ml     Body mass index is 20.15 kg/m.     Meds:     Current Facility-Administered Medications   Medication Dose Route Frequency    amLODIPine  5 mg Oral QAM    apixaban  5 mg Oral Q12H [facility]    clopidogrel  75 mg Oral QAM    finasteride  5 mg Oral QAM    glimepiride  1 mg Oral QAM AC    insulin glargine  12 Units Subcutaneous QHS    insulin lispro (1 Unit Dial)  1-9 Units Subcutaneous TID AC    And    insulin lispro (1 Unit Dial)  1-7 Units Subcutaneous QHS    lactobacillus species  50 Billion CFU Oral Daily    piperacillin-tazobactam  2.25 g Intravenous Q8H    simvastatin  20 mg Oral QHS    sodium bicarbonate  650 mg Oral BID    sodium chloride (PF)  3 mL Intravenous Q8H    vitamin D  25 mcg Oral QAM      sodium chloride 50 mL/hr at 02/07/18 0626     PRN Meds: dextrose, glucagon (rDNA), hydrALAZINE, NSG Communication: Glucose POCT order (AC, HS) **AND** insulin lispro (1 Unit Dial) **AND** insulin lispro (1 Unit Dial) **AND** insulin lispro (1 Unit Dial), naloxone, ondansetron.     LABS:     Estimated Creatinine Clearance: 17.9 mL/min (A) (based on SCr of 3.17 mg/dL (H)).  Recent Labs   Lab 02/07/18  0253 02/06/18  0344   WBC 17.4* 16.1*   RBC 4.04 4.25   Hemoglobin 12.4* 13.0   Hematocrit 38.0* 40.0   MCV 94 94   PLT CT 197 162     Recent Labs   Lab 01/12/2018  1300   PT 12.3*   PT INR 1.2     Recent Labs   Lab 02/05/2018  1300   Creatine Kinase (CK) 375*     Lab Results   Component Value Date    HGBA1CPERCNT 5.7 01/30/2018     Recent Labs   Lab 02/07/18  0253 02/06/18  0344 02/05/18  0336   Glucose 222* 223* 174*   Sodium 136 136 132*   Potassium 4.0 4.4 4.6   Chloride 102 105 102   CO2 21 19* 19*  BUN 53* 42* 41*   Creatinine 3.17* 2.85* 2.57*   EGFR 21* 24* 27*   Calcium 8.1* 8.6 8.5         Recent Labs   Lab 02/07/18  1220   Specific Gravity, UR 1.019   pH, Urine 5.0   Protein, UR 30*   Glucose, UA Negative   Ketones UA Negative    Bilirubin, UA Negative   Blood, UA Small*   Nitrite, UA Negative   Urobilinogen, UA Normal   Leukocyte Esterase, UA Negative   WBC, UA 4   RBC, UA 2   Bacteria, UA Rare*      Patient Lines/Drains/Airways Status    Active PICC Line / CVC Line / PIV Line / Drain / Airway / Intraosseous Line / Epidural Line / ART Line / Line / Wound / Pressure Ulcer / NG/OG Tube     Name:   Placement date:   Placement time:   Site:   Days:    Peripheral IV 02/04/18 Anterior;Left Upper Arm   02/04/18    1610    Upper Arm   less than 1               Ct Head Wo Contrast    Result Date: 02/04/2018  1. Previous possible tiny acute left subdural hematoma no longer well visualized. No enlarging extra-axial fluid collections. 2. 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 No significant change or mass effect. 3. Frontal superficial injury with swelling and subcutaneous gas. ReadingStation:[email or web address]    Ct Head Wo- (rad Read)    Result Date: 01/28/2018  1.  Possible tiny acute subdural hematoma on the left, without mass effect: Dr. Norman Clay was informed of this by telephone at 1354 hours on 03 February 2018. 2.  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 3.  Other chronic intracranial changes as above. 4.  Frontal scalp contusion. ReadingStation:[email or web address]    Ct Chest Wo Contrast    Result Date: 01/22/2018  1. No radiopaque foreign body within the esophagus, previously noted mass or ingested material in the upper thoracic esophagus has resolved. 2. Nodular infiltrate of the right lung base along the dependent aspect likely the basis of aspiration pneumonia. Would recommend follow-up to resolution. 3. Evidence for granulomatous disease. There is a 3 mm nonspecific right upper lobe nodule. Would recommend a 3-6 month follow-up to evaluate stability. 4. Multiple ill-defined hypodensities within the liver. These are  nonspecific however metastatic lesions cannot be excluded. Would recommend dedicated contrasted triphasic CT of the abdomen(include delayed postcontrast evaluation), or contrasted MRI for more definitive evaluation. ReadingStation:[email or web address]    Ct Cervical Spine Wo    Result Date: 01/26/2018  No fractures are identified. Multilevel degenerative facet disease. There is at least mild spinal stenosis at C3-4 and C4-5, and moderate spinal stenosis at C5-6. Some food or a mass is visible in the upper thoracic esophagus, filling the lumen. ReadingStation:[email or web address]    US Renal Kidney Bladder Complete    Result Date: 02/07/2018  1. No hydronephrosis. Echogenic kidneys which may be seen with medical renal disease. Possible 6 mm nonobstructing stone in lower pole the left kidney. 2. No abnormalities identified of the urinary bladder. Prostate enlargement. ReadingStation:[email or web address]    Xr Chest Ap Portable    Result Date: 02/08/2018  No acute findings. [place] Needs:  There are no questions and answers to display.  Nutrition assessment done in collaboration with Registered Dietitians:     Time spent:      Arman Filter, DO     02/07/18,8:44 PM   MRN: [id number]                                      CSN: [id number] DOB: 11-13-1940

## 2018-02-07 NOTE — Plan of Care (Addendum)
NURSE NOTE SUMMARY  [facility] [place]   Patient Name: [name]   Attending Physician: Arman Filter, DO   Today's date:   02/07/2018 LOS: 0 days   Shift Summary:                                                              Nonverbal at baseline, vomited tan emesis 3x.    Provider Notifications:      Rapid Response Notifications:  Mobility:      PMP Activity: Step 3 - Bed Mobility (02/06/2018  8:00 PM)     Weight tracking:  Family Dynamic:   No data found.          Recent Vitals Last Bowel Movement   BP: 164/80 (02/07/2018  4:06 AM)  Heart Rate: 63 (02/07/2018  4:06 AM)  Temp: 98.1 F (36.7 C) (02/07/2018  4:06 AM)  Resp Rate: 18 (02/07/2018  4:06 AM)  SpO2: 94 % (02/07/2018  4:06 AM)   Last BM Date: 01/21/2018           Problem: Compromised Tissue integrity  Goal: Damaged tissue is healing and protected  Outcome: Progressing  Goal: Nutritional status is improving  02/07/2018 9021 by Grace Isaac, RN  Outcome: Progressing  Flowsheets (Taken 02/07/2018 0458)  Nutritional status is improving: Assist patient with eating; Allow adequate time for meals; Encourage patient to take dietary supplement(s) as ordered; Collaborate with Clinical Nutritionist; Include patient/patient care companion in decisions related to nutrition  02/07/2018 1155 by Grace Isaac, RN  Outcome: Progressing

## 2018-02-08 ENCOUNTER — Inpatient Hospital Stay: Payer: Medicare Other

## 2018-02-08 LAB — VH DEXTROSE STICK GLUCOSE
Glucose POCT: 72 mg/dL (ref 71–99)
Glucose POCT: 64 mg/dL — ABNORMAL LOW (ref 71–99)
Glucose POCT: 98 mg/dL (ref 71–99)
Glucose POCT: 110 mg/dL — ABNORMAL HIGH (ref 71–99)
Glucose POCT: 61 mg/dL — ABNORMAL LOW (ref 71–99)
Glucose POCT: 103 mg/dL — ABNORMAL HIGH (ref 71–99)
Glucose POCT: 204 mg/dL — ABNORMAL HIGH (ref 71–99)

## 2018-02-08 LAB — BASIC METABOLIC PANEL
Anion Gap: 12.5 mMol/L (ref 7.0–18.0)
BUN / Creatinine Ratio: 20.2 Ratio (ref 10.0–30.0)
BUN: 70 mg/dL — ABNORMAL HIGH (ref 7–22)
CO2: 22 mMol/L (ref 20–30)
Calcium: 7.8 mg/dL — ABNORMAL LOW (ref 8.5–10.5)
Chloride: 107 mMol/L (ref 98–110)
Creatinine: 3.46 mg/dL — ABNORMAL HIGH (ref 0.80–1.30)
EGFR: 19 mL/min/{1.73_m2} — ABNORMAL LOW (ref 60–150)
Glucose: 95 mg/dL (ref 71–99)
Osmolality Calculated: 296 mOsm/kg (ref 275–300)
Potassium: 3.5 mMol/L (ref 3.5–5.3)
Sodium: 138 mMol/L (ref 136–147)

## 2018-02-08 LAB — CBC AND DIFFERENTIAL
Basophils %: 0 % (ref 0.0–3.0)
Basophils Absolute: 0 10*3/uL (ref 0.0–0.3)
Eosinophils %: 0 % (ref 0.0–7.0)
Eosinophils Absolute: 0 10*3/uL (ref 0.0–0.8)
Hematocrit: 34.3 % — ABNORMAL LOW (ref 39.0–52.5)
Hemoglobin: 11.1 gm/dL — ABNORMAL LOW (ref 13.0–17.5)
Lymphocytes Absolute: 0.9 10*3/uL (ref 0.6–5.1)
Lymphocytes: 8 % — ABNORMAL LOW (ref 15.0–46.0)
MCH: 31 pg (ref 28–35)
MCHC: 33 gm/dL (ref 32–36)
MCV: 94 fL (ref 80–100)
MPV: 8.8 fL (ref 6.0–10.0)
Monocytes Absolute: 1.4 10*3/uL (ref 0.1–1.7)
Monocytes: 12 % (ref 3.0–15.0)
Neutrophils %: 80 % — ABNORMAL HIGH (ref 42.0–78.0)
Neutrophils Absolute: 9 10*3/uL — ABNORMAL HIGH (ref 1.7–8.6)
PLT CT: 194 10*3/uL (ref 130–440)
RBC: 3.64 10*6/uL — ABNORMAL LOW (ref 4.00–5.70)
RDW: 11.9 % (ref 11.0–14.0)
WBC: 11.3 10*3/uL — ABNORMAL HIGH (ref 4.0–11.0)

## 2018-02-08 LAB — ECG 12-LEAD
P Wave Axis: 74 deg
P-R Interval: 185 ms
Patient Age: 77 years
Q-T Interval(Corrected): 426 ms
Q-T Interval: 335 ms
QRS Axis: 2 deg
QRS Duration: 170 ms
T Axis: 59 years
Ventricular Rate: 97 //min

## 2018-02-08 MED ORDER — BARIUM SULFATE 40 % PO PSTE
5.00 mL | PASTE | Freq: Once | ORAL | Status: AC | PRN
Start: 2018-02-08 — End: 2018-02-08
  Administered 2018-02-08: 14:00:00 5 mL via ORAL

## 2018-02-08 MED ORDER — BARIUM SULFATE 40% ORAL SUSPENSION
5.00 mL | Freq: Once | Status: AC | PRN
Start: 2018-02-08 — End: 2018-02-08
  Administered 2018-02-08: 14:00:00 5 mL via ORAL

## 2018-02-08 MED ORDER — BARIUM SULFATE 40 % PO SUSP
5.00 mL | Freq: Once | ORAL | Status: AC | PRN
Start: 2018-02-08 — End: 2018-02-08
  Administered 2018-02-08: 14:00:00 5 mL via ORAL

## 2018-02-08 MED ORDER — BARIUM SULFATE 40 % PO SUSR
5.00 mL | Freq: Once | ORAL | Status: AC | PRN
Start: 2018-02-08 — End: 2018-02-08
  Administered 2018-02-08: 14:00:00 5 mL via ORAL

## 2018-02-08 NOTE — Progress Notes (Signed)
[facility]   Patient Name: Wardell Heath   Attending Physician: Barbaraann Faster, MD   Today's date:   02/08/2018 LOS: 1 days   Expected Discharge Date Expected Discharge Date: (pending placement)    Quick  Assessment:                                                              ReAdmit Risk Score: 20    CM Comments: 02/08/18 RNCM: Pt adm s/p fall, subdural hematoma.  PTA lived at home with niece.  Pt used a cane to amb with and needed assist with adls.  Pain mgmt.  Neuro following.  Fall precautions.  IVF's.  Abx.  Pts niece can no longer care for pt and is wanting placement for him.  NCM called niece to discuss facility options/preferences but had to leave a VM for her to call me back.  NCM sent referral to [facility] for LTC placement options.  Barriers: LTC bed offer and Medicaid application is pending.  NCM will continue to follow. Bayard Beaver                                                                                       Provider Notifications:        Eusebio Me RN,BSN  Case Management  Phone number: [phone]

## 2018-02-08 NOTE — Progress Notes (Addendum)
Facility List and Fact Sheet Provided: Y/N  NCM called and left a message for [employer] and Order of Preference   LAM with POA to discuss, but NCM sent out to [facility] facilities today.   Financial status    [employer] Medicare                                         Does the patient need a UAI   No                                       Insurance coverage    [employer], pending medicaid application pending.                                       Med Assist Referral    Yes                                       FIS completed N/A

## 2018-02-08 NOTE — UM Notes (Signed)
[facility] Utilization Management Review Sheet    Facility: [facility]    Name: [name]    DOB: August 15, 1940     MRN: [id number]     CSN: [id number]     Time and date to ED: 01/13/2018 12:46 PM (admit to Observation)    Admitting MD: Dr. Rivka Safer, NPI [id number]    Patient Class:  Admit to Inpatient [[id number]] 02/07/2018 @ 1919   Simonne Martinet Verdia Kuba, DO Status: Completed   Mode: Ordering in Telephone with readback mode Communicated by: Sherrie Mustache, RN   Ordering user: Sherrie Mustache, RN 02/07/18 1919 Ordering provider: Arman Filter, DO   Authorized by: Arman Filter, DO       Payor: Payor: MEDICARE HMO / Plan: ANTHEM MEDICARE HMO/PPO / Product Type: MANAGED MEDICARE /     ID# [id number]    Auth# pending [id number]    Diagnosis:  N17.9, J69.0, D72.829       Date of service reviewed: 02/07/2018    HPI: Patient admitted to observation on 02/08/2018 after a fall at home. ED diagnostic testing revealed a possible tiny subdural hematoma.Patient was seen by neurosurgical service,recommend observation overnight and repeat a CT scan of the head if there is no hematoma patient can be discharged. Since 01/25/2018 patient has been receiving IV zosyn for possible aspiration pneumonia.    Per Dr. Simonne Martinet on 02/07/2018, patient "Has persistent leukocytosis despite zosyn, unclear etiology, possible aspiration. Also renal function worsening for no apparent reason despite fluids and normal renal/bladder US. Consider renal consult if continues to worsen."     Patient admitted to Inpatient on 02/07/2018.      Vitals & Treatment:  Tmax 100.22F (Oral), BP:101/58, HR: 52-61, RR:22, SaO2:94%    LABS: wbc 17.4, H&H 12.4/38, 92% neutrophils, 1% bands, BUN/creatinine 53/3.17 (which is up from 27/2.30 on 1/25), sodium 136, calcium 8.1, glucose 102-245    PRN meds: hydralazine 10mg  IV x1, zofran 4mg  IV x1    Renal ultrasound:   IMPRESSION: 1. No hydronephrosis. Echogenic kidneys which may be seen with medical renal disease.  Possible 6 mm nonobstructing stone in lower pole the left kidney.      Plan of Care:  Stage 4 CKD  Creatinine uptrending despite fluids  Renal ultrasound showed medical renal disease  Cont monitor consider renal consult    Leukocytosis with possible aspiration PNA on CT  - cont zosyn    Hypertension  Continue home BP medications  IV hydralazine for blood pressure greater than 160    Diabetesmellitus  Cont oral meds  Cont sliding scale insulin  Added basal insulin for hyperglycemia    Scheduled Meds:  Current Facility-Administered Medications   Medication Dose Route Frequency    amLODIPine  5 mg Oral QAM    apixaban  5 mg Oral Q12H [facility]    clopidogrel  75 mg Oral QAM    finasteride  5 mg Oral QAM    glimepiride  1 mg Oral QAM AC    insulin glargine  12 Units Subcutaneous QHS    insulin lispro (1 Unit Dial)  1-9 Units Subcutaneous TID AC    And    insulin lispro (1 Unit Dial)  1-7 Units Subcutaneous QHS    lactobacillus species  50 Billion CFU Oral Daily    piperacillin-tazobactam  2.25 g Intravenous Q8H    simvastatin  20 mg Oral QHS    sodium bicarbonate  650 mg Oral BID    sodium chloride (  PF)  3 mL Intravenous Q8H    vitamin D  25 mcg Oral QAM     Continuous Infusions:   sodium chloride 50 mL/hr at 02/08/18 0424       DOS 02/08/2018 - no physician notes at time of review - plan for esophagram/barium swallow today    Vitals: BP 122/57    Pulse 71    Temp 99.5 F (37.5 C) (Oral)    Resp 16    Ht 1.778 m (5\' 10" ) Comment: 5 ft 10 in   Wt 63.7 kg (140 lb 6.9 oz)    SpO2 95%    BMI 20.15 kg/m      Recent Labs   Lab 02/08/18  0237   Sodium 138   Potassium 3.5   Chloride 107   CO2 22   BUN 70*   Creatinine 3.46*   EGFR 19*   Glucose 95   Calcium 7.8*      Recent Labs   Lab 02/08/18  0237   WBC 11.3*   Hemoglobin 11.1*   Hematocrit 34.3*   PLT CT 194        Gabriela Eves, RN BSN  Utilization Review Nurse  Utilization Management  [facility]  [address]  [place], [place] [address]   Phone: [phone], direct/confidential  Fax: [phone]  [email or web address]

## 2018-02-08 NOTE — Plan of Care (Signed)
Problem: Neurological Deficit  Goal: Neurological status is stable or improving  Outcome: Progressing  Flowsheets (Taken 02/08/2018 1536)  Neurological status is stable or improving: Monitor/assess/document neurological assessment (Stroke: every 4 hours)     Problem: Potential for Aspiration  Goal: Risk of aspiration will be minimized  Outcome: Progressing  Flowsheets (Taken 02/08/2018 1536)  Risk of aspiration will be minimized : Assess and monitor ability to swallow; Monitor/assess for signs of aspiration (tachypnea, cough, wheezing, clearing throat, hoarseness after eating, decrease in SaO2); Order modified texture diet as recommended by Speech Pathologist; Thicken liquids as recommended by Speech Pathologist; Head of bed up 90 degrees to eat if unable to be out of bed; Supervise patient during oral intake; Consult/collaborate with Speech Pathologist for dysphagia     Problem: Compromised Hemodynamic Status  Goal: Vital signs and fluid balance maintained/improved  Outcome: Progressing  Flowsheets (Taken 02/08/2018 1536)  Vital signs and fluid balance are maintained/improved: Monitor/assess vitals and hemodynamic parameters with position changes; Monitor/assess lab values and report abnormal values; Monitor intake and output. Notify LIP if urine output is less than 30 mL/hour.     Problem: Impaired Mobility  Goal: Mobility/Activity is maintained at optimal level for patient  Outcome: Progressing  Flowsheets (Taken 02/08/2018 1536)  Mobility/activity is maintained at optimal level for patient: Perform active/passive ROM; Reposition patient every 2 hours and as needed unless able to reposition self; Consult/collaborate with Physical Therapy and/or Occupational Therapy

## 2018-02-08 NOTE — SLP Eval Note (Addendum)
[facility]  Speech Language Pathology  Modified Barium Swallow Study    Patient: [name]       CSN#: [id number]   Referring Physician:  Arman Filter, DO  Date of Referral:  02/07/18    Reason for referral:  [name] is a 78 y.o. male presenting to this hospital on 01/19/2018 referred for a Modified Barium Swallow study to objectively assess swallow function, effectiveness of compensatory swallow strategies and assist in determining most appropriate diet.  Pt presents with aspiration risk at bedside.    ASSESSMENT/PLAN/RECOMMENDATIONS:     SLP diagnosis:  Moderate Oropharyngeal Dysphagia    Diet Recommendations:  Pureed diet/Moderately Thick (Honey thick liquids)    Administration of Medications: Meds crushed in applesauce/pureed    Precautions/Compensations:  Position patient upright 90 degrees for all po intake/meds, Keep patient upright 60 minutes after meals, Alternate liquids and solids (liquid wash), Take small bites/sips, Eat/feed slowly, Aspiration precautions, Reflux precautions  Suggestions for Feeding:  1:1 supervision (next to patient to supervise each bite/sip, provide hands-on assist/cues prn, 100% of meal visualized)  Prognosis:  Fair d/t multiple medical complications  Repeat Modified Barium Swallow Study recommended: no;Not indicated at this time  SLP Follow-Up Inpatient Treatment needs: 1-2x; diet tolerance assessment; pt/staff education  Duration of Inpatient Treatment: Until swallowing managed  Projected Discharge Recommendations:  Patient will need ongoing SLP at next level of care  Other referrals:  n/a    Goals: Pt will:  STG: (In 3-4 days)  1.    2. Pt will tolerate a PUREED diet with HONEY THICK liquids free of signs/symptoms of aspiration. (ONGOING)   3. Pt will participate in a Modified Barium Swallow Study (Cookie Swallow) to further assess swallowing and assist in making most appropriate diet recommendations and efficacy of swallowing strategies. (MET)    4. Pt/family/staff education(ONGOING)   5. Pt will use compensatory swallow strategies of Position upright 90 degrees for all po intake/meds, Keep patient upright 60 minutes after meals, Alternate liquids and solids, Take small bites/sips, Eat/feed slowly during meals given mod assist (NEW)       LTG: (By discharge)  1. Pt will tolerate the least restrictive diet free of signs/symptoms of aspiration.(ONGOING)      Medical Diagnosis: Renal insufficiency [N28.9]  History of CVA (cerebrovascular accident) [Z86.73]  Scalp laceration, initial encounter [S01.01XA]  Fall at home, initial encounter [W19.XXXA, F16.384]  Dementia without behavioral disturbance, unspecified dementia type [F03.90]  Leukocytosis, unspecified type [D72.829]  Traumatic subdural hematoma without loss of consciousness, initial encounter [S06.5X0A]  Type 2 diabetes mellitus with hyperglycemia, unspecified whether long term insulin use [E11.65]    History of Present Illness: [name] is a 78 y.o. male admitted on 02/01/2018 with  Patient Active Problem List   Diagnosis    Glaucoma, unspecified glaucoma type, unspecified laterality    Pure hypercholesterolemia    Essential hypertension    Type 2 diabetes mellitus with stage 3 chronic kidney disease, without long-term current use of insulin    History of stroke with residual deficit    Bradycardia    Heme positive stool    Nonverbal    Benign prostatic hyperplasia with urinary hesitancy    Microalbuminuria    Altered mental status    Bilateral pulmonary embolism    Fall (on) (from) other stairs and steps, initial encounter    Stroke    Subdural hematoma        Past Medical/Surgical History:  Past Medical History:  Diagnosis Date    Cataracts, bilateral     Diabetes mellitus     Hypertension     Kidney disorder     stage IV kidney disease    Stroke       Past Surgical History:   Procedure Laterality Date    EYE SURGERY      LUNG LOBECTOMY      right eye  transplant      unsuccessful         Baseline cognitive-communication/swallowing status:   "Pt w/ hx of dementia/ aphasia and is nonverbal at baseline. Pt w/ soft/bite sized diet w/ thin liquids at baseline."    SUBJECTIVE:     Patient is agreeable to participation in the therapy session. Patients medical condition is appropriate for Speech therapy intervention at this time.    S:  Pt nonverbal, though nodded "yes" to all questions.  Questionable reliability.      OBJECTIVE:     Radiologist/Tech:   Nitz/Stauffer     Observation of Patient/Vital Signs:    Positioning: Patient is upright in swallow study chair.  Respiratory status: Room air  Source of nutrition at time of eval:  Pureed Diet, Moderately Thick (Honey) Liquids  Other medical equipment in place: n/a       Consistencies/Amounts Presented:  THIN LIQUID:  straw sip x4  MILDLY THICK (NECTAR) LIQUID:  straw sip x3  MODERATELY THICK (HONEY) LIQUID:  straw sip x4  PUREED:  1/2 tsp x1, 1 tsp x1  MECHANICAL SOFT: 1/2 tsp x1      Stages of Swallow:  Oral Preparatory/Oral:  Pt impulsive and takes large consecutive sips. Prolonged/effortful mastication of solid consistencies resulting in piecemeal deglutition x4 for single bite.  Tongue pumping noted during A-P propulsion.  Oral residuals contribute to valleculae pooling post swallow.  Pharyngeal:  Premature spill was noted to the valleculae across all consistencies increasing to the pyriforms with all liquids d/t reduced oral control and mild swallow delay.  Pooling noted within the valleculae and pyriforms post swallow d/t spillover of oral residuals, mildly reduced pharyngeal constriction and slowed transit through UES.    Aspiration:  No aspiration occurred this study but risk present d/t epiglottic undercoating noted with thin and nectar thick liquids d/t overflow from the pyriforms during the swallow and d/t inferior migration of valleculae residue post swallow; inconsistent weak throat clear response.   Pt  unable to cough on command to clear material increasing aspiration risk.  Mild risk present with honey thick liquids (trace penetration; mostly redirected) though risk reduced with small bites/sips.  UES:  Slowed bolus transit was noted through the UES contributing to pyriform pooling post swallow.      Pt/Family/Caregiver Education Provided:     Patient was/were educated re: role of slp, purpose of evaluation, sign/symptoms/risks of aspiration, safe feeding strategies, results/recommendations of today's evaluation, tx goals  Good understanding was verbalized/demonstrated: no  Comprehension limited by: impaired cognitive-communication skills    Team Communication:     Spoke to : RN/LPN - Ena Dawley  Regarding: results/recommendations of today's evaluation, tx goals, progress on goals  Good understanding was verbalized: yes                     Time of treatment:  SLP Received On: 02/08/18  Start Time: 1320  Stop Time: 1345  Time Calculation (min): 25 min    SLP Visit Number: 3      Study was performed by:  Garlan Fair, M.S.,  CCC-SLP  Copywriter, advertising, [place] [facility] [place], [place] Phone [phone], or Pager [phone]

## 2018-02-08 NOTE — Plan of Care (Signed)
Problem: Compromised Tissue integrity  Goal: Damaged tissue is healing and protected  Outcome: Progressing  Flowsheets (Taken 02/07/2018 0458)  Damaged tissue is healing and protected : Monitor/assess Braden scale every shift;Provide wound care per wound care algorithm;Reposition patient every 2 hours and as needed unless able to reposition self;Increase activity as tolerated/progressive mobility;Relieve pressure to bony prominences for patients at moderate and high risk;Keep intact skin clean and dry;Avoid shearing injuries;Use bath wipes, not soap and water, for daily bathing;Use incontinence wipes for cleaning urine, stool and caustic drainage. Foley care as needed;Monitor external devices/tubes for correct placement to prevent pressure, friction and shearing  Goal: Nutritional status is improving  Outcome: Progressing  Flowsheets (Taken 02/07/2018 0458)  Nutritional status is improving: Assist patient with eating;Allow adequate time for meals;Encourage patient to take dietary supplement(s) as ordered;Collaborate with Clinical Nutritionist;Include patient/patient care companion in decisions related to nutrition

## 2018-02-08 NOTE — Progress Notes (Signed)
Medicine Progress Note   [facility]   Patient Name: [name], [name] LOS: 1 days   Attending Physician: Barbaraann Faster, MD PCP: Melvyn Neth, MD      Hospital Course:                                                            Hendricks Milo Junior Hoffman is a 78 y.o. male Ms. past medical history old stroke, CAD,diabetes mellitus, hypertension,stage 4 CKD and dementia who came in for extended for and skin laceration.  Patient has dementia patient cannot provide any medical information history obtained from the ER physician and the chart.  Patient was found to have possible tiny subdural hematoma.  Seen by neurosurgical service, recommend observation overnight and repeat a CT scan of the head if there is no hematoma patient can be discharged.  And hold on Plavix.  The scalp laceration been sutured. Repeat CT showed no evidence of bleed. Meds resumed. Placement pending as family states they cannot take care of him at home.    Has persistent leukocytosis despite zosyn, unclear etiology, possible aspiration. Also renal function worsening for no apparent reason despite fluids and normal renal/bladder US. Consider renal consult if continues to worsen.     Assessment and Plan:      Possible tiny acute subdural hematoma on the left, without mass effect  Due to accidental fall  No neuro deficit  Neurosurgery consulted recommend repeat a CT scan if CT scan no increase of hematoma patient can be discharged  Control blood pressure  Hold Plavix and an aspirin  --> normal repeat CT, can resume meds      Scalp laceration due to accidental fall  S/P suture  CT of the neck neck did not show to fracture or dislocation      Esophageal foreign body possible food or mass  This is detected on the CT of the neck  Will order CT of chest without contrast  --> normal CT      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  Hold aspirin and Plavix due to  hematoma, continue statin  --> resume meds    Stage 4 CKD  Creatinine uptrending despite fluids  Renal ultrasound showed medical renal disease  Cont monitor consider renal consult    Leukocytosis with possible aspiration PNA on CT  - cont zosyn    Hypertension   Continue home BP medications  IV hydralazine for blood pressure greater than 160      Diabetesmellitus  Cont oral meds  Cont sliding scale insulin  Added basal insulin for hyperglycemia        Single-vessel CAD continue aspirin statin       Acute Bilateral PE Dec 2019  No acute pulmonary issue  Resume Eliquis      DVT PPx: Eliquis    Dispo: Observation -- family says cannot take care of him at home, PT/OT eval ordered, recs LTC.  Otherwise has been medically stable for discharge    Discussed with case management and nursing    Healthcare Proxy: Family    Code: Full code   Subjective     Patient is demented  Cannot have a good communication  Objective   Physical Exam:     Vitals: T:98.6 F (37 C) (Oral), BP:142/62, HR:71, RR:17, SaO2:94%    General: Patient is awake. In no acute distress.  HEENT: No conjunctival drainage, vision is intact, anicteric sclera.  Neck: Supple, no thyromegaly.  Chest: CTA bilaterally. No rhonchi, no wheezing. No use of accessory muscles.  CVS: Normal rate and regular rhythm no murmurs, without JVD, no pitting edema, pulses palpable.  Abdomen: Soft, non-tender, no guarding or rigidity, with normal bowel sounds.  Extremities: No calf swelling and no gross deformity.  Skin: Warm, dry, no rash and no worrisome lesions.  NEURO: No motor or sensory deficits.  Psychiatric: Alert, interactive, appropriate, normal affect.  Weight Monitoring 11/28/2017 12/29/2017 12/29/2017 12/31/2017 01/01/2018 01/17/2018 01/31/2018   Height 167.6 cm - 167.6 cm - - 177.8 cm 177.8 cm   Weight 66.679 kg 64 kg 64 kg 63.2 kg 62.1 kg 64.91 kg 63.7 kg   Weight Method - Actual - Standing Scale Standing Scale - Bed Scale   BMI (calculated)  23.8 kg/m2 - 22.8 kg/m2 - - 20.6 kg/m2 20.2 kg/m2         Intake/Output Summary (Last 24 hours) at 02/08/2018 1816  Last data filed at 02/08/2018 1744  Gross per 24 hour   Intake 760 ml   Output 500 ml   Net 260 ml     Body mass index is 20.15 kg/m.     Meds:     Current Facility-Administered Medications   Medication Dose Route Frequency    amLODIPine  5 mg Oral QAM    apixaban  5 mg Oral Q12H [facility]    clopidogrel  75 mg Oral QAM    finasteride  5 mg Oral QAM    glimepiride  1 mg Oral QAM AC    insulin glargine  12 Units Subcutaneous QHS    insulin lispro (1 Unit Dial)  1-9 Units Subcutaneous TID AC    And    insulin lispro (1 Unit Dial)  1-7 Units Subcutaneous QHS    lactobacillus species  50 Billion CFU Oral Daily    piperacillin-tazobactam  2.25 g Intravenous Q8H    simvastatin  20 mg Oral QHS    sodium bicarbonate  650 mg Oral BID    sodium chloride (PF)  3 mL Intravenous Q8H    vitamin D  25 mcg Oral QAM      sodium chloride 50 mL/hr at 02/08/18 0424     PRN Meds: dextrose, glucagon (rDNA), hydrALAZINE, NSG Communication: Glucose POCT order (AC, HS) **AND** insulin lispro (1 Unit Dial) **AND** insulin lispro (1 Unit Dial) **AND** insulin lispro (1 Unit Dial), naloxone, ondansetron.     LABS:     Estimated Creatinine Clearance: 16.4 mL/min (A) (based on SCr of 3.46 mg/dL (H)).  Recent Labs   Lab 02/08/18  0237 02/07/18  0253   WBC 11.3* 17.4*   RBC 3.64* 4.04   Hemoglobin 11.1* 12.4*   Hematocrit 34.3* 38.0*   MCV 94 94   PLT CT 194 197     Recent Labs   Lab 01/14/2018  1300   PT 12.3*   PT INR 1.2     Recent Labs   Lab 01/27/2018  1300   Creatine Kinase (CK) 375*     Lab Results   Component Value Date    HGBA1CPERCNT 5.7 02/10/2018     Recent Labs   Lab 02/08/18  0237 02/07/18  0253 02/06/18  0344   Glucose  95 222* 223*   Sodium 138 136 136   Potassium 3.5 4.0 4.4   Chloride 107 102 105   CO2 22 21 19*   BUN 70* 53* 42*   Creatinine 3.46* 3.17* 2.85*   EGFR 19* 21* 24*   Calcium 7.8* 8.1* 8.6          Recent Labs   Lab 02/07/18  1220   Specific Gravity, UR 1.019   pH, Urine 5.0   Protein, UR 30*   Glucose, UA Negative   Ketones UA Negative   Bilirubin, UA Negative   Blood, UA Small*   Nitrite, UA Negative   Urobilinogen, UA Normal   Leukocyte Esterase, UA Negative   WBC, UA 4   RBC, UA 2   Bacteria, UA Rare*      Patient Lines/Drains/Airways Status    Active PICC Line / CVC Line / PIV Line / Drain / Airway / Intraosseous Line / Epidural Line / ART Line / Line / Wound / Pressure Ulcer / NG/OG Tube     Name:   Placement date:   Placement time:   Site:   Days:    Peripheral IV 02/04/18 Anterior;Left Upper Arm   02/04/18    1610    Upper Arm   less than 1               Ct Head Wo Contrast    Result Date: 02/04/2018  1. Previous possible tiny acute left subdural hematoma no longer well visualized. No enlarging extra-axial fluid collections. 2. 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 No significant change or mass effect. 3. Frontal superficial injury with swelling and subcutaneous gas. ReadingStation:[email or web address]    Ct Head Wo- (rad Read)    Result Date: 01/11/2018  1.  Possible tiny acute subdural hematoma on the left, without mass effect: Dr. Norman Clay was informed of this by telephone at 1354 hours on 03 February 2018. 2.  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 3.  Other chronic intracranial changes as above. 4.  Frontal scalp contusion. ReadingStation:[email or web address]    Ct Chest Wo Contrast    Result Date: 01/15/2018  1. No radiopaque foreign body within the esophagus, previously noted mass or ingested material in the upper thoracic esophagus has resolved. 2. Nodular infiltrate of the right lung base along the dependent aspect likely the basis of aspiration pneumonia. Would recommend follow-up to resolution. 3. Evidence for granulomatous disease. There is a 3 mm nonspecific right  upper lobe nodule. Would recommend a 3-6 month follow-up to evaluate stability. 4. Multiple ill-defined hypodensities within the liver. These are nonspecific however metastatic lesions cannot be excluded. Would recommend dedicated contrasted triphasic CT of the abdomen(include delayed postcontrast evaluation), or contrasted MRI for more definitive evaluation. ReadingStation:[email or web address]    Ct Cervical Spine Wo    Result Date: 01/21/2018  No fractures are identified. Multilevel degenerative facet disease. There is at least mild spinal stenosis at C3-4 and C4-5, and moderate spinal stenosis at C5-6. Some food or a mass is visible in the upper thoracic esophagus, filling the lumen. ReadingStation:[email or web address]    Fl Modified Barium Swallow W Speech Therapy    Result Date: 02/08/2018  Flash penetration with honey thick consistency. Please see separate speech pathology report for further details. ReadingStation:[email or web address]    US Renal Kidney Bladder Complete    Result Date: 02/07/2018  1. No hydronephrosis. Echogenic kidneys which may be  seen with medical renal disease. Possible 6 mm nonobstructing stone in lower pole the left kidney. 2. No abnormalities identified of the urinary bladder. Prostate enlargement. ReadingStation:[email or web address]    Xr Chest Ap Portable    Result Date: 02/05/2018  No acute findings. [place] Needs:  There are no questions and answers to display.       Nutrition assessment done in collaboration with Registered Dietitians:     Time spent:      Barbaraann Faster, MD     02/08/18,6:16 PM   MRN: [id number]                                      CSN: [id number] DOB: Dec 12, 1940

## 2018-02-09 LAB — VH DEXTROSE STICK GLUCOSE
Glucose POCT: 96 mg/dL (ref 71–99)
Glucose POCT: 68 mg/dL — ABNORMAL LOW (ref 71–99)
Glucose POCT: 90 mg/dL (ref 71–99)
Glucose POCT: 108 mg/dL — ABNORMAL HIGH (ref 71–99)
Glucose POCT: 248 mg/dL — ABNORMAL HIGH (ref 71–99)

## 2018-02-09 LAB — BASIC METABOLIC PANEL
Anion Gap: 12.1 mMol/L (ref 7.0–18.0)
BUN / Creatinine Ratio: 22 Ratio (ref 10.0–30.0)
BUN: 71 mg/dL — ABNORMAL HIGH (ref 7–22)
CO2: 22 mMol/L (ref 20–30)
Calcium: 8 mg/dL — ABNORMAL LOW (ref 8.5–10.5)
Chloride: 110 mMol/L (ref 98–110)
Creatinine: 3.22 mg/dL — ABNORMAL HIGH (ref 0.80–1.30)
EGFR: 20 mL/min/{1.73_m2} — ABNORMAL LOW (ref 60–150)
Glucose: 94 mg/dL (ref 71–99)
Osmolality Calculated: 300 mOsm/kg (ref 275–300)
Potassium: 4.1 mMol/L (ref 3.5–5.3)
Sodium: 140 mMol/L (ref 136–147)

## 2018-02-09 MED ORDER — ACETAMINOPHEN 325 MG PO TABS
650.0000 mg | ORAL_TABLET | Freq: Four times a day (QID) | ORAL | Status: DC | PRN
Start: 2018-02-09 — End: 2018-02-25
  Administered 2018-02-09 – 2018-02-14 (×5): 650 mg via ORAL
  Filled 2018-02-09 (×5): qty 2

## 2018-02-09 NOTE — PT Progress Note (Signed)
______________________________________________________________________    I have reviewed and agree with the documentation authored by my student of the evaluation/ treatment/education/care plan delivered during my direct supervision.     Co-Signed by:  Jillyn Hidden, PT  ______________________________________________________________________    Physical Therapy PROGRESS note                             [facility]HS: [facility]  Patient: [name] [place]     CSN: [id number]    Bed: 4505/4505    Visit#: 3   Treatment Frequency: 2-3x/wk  Last seen by a physical therapist vs. Physical therapist assistant: 02/09/2018    DISCHARGE RECOMMENDATIONS   Discharge Recommendations:   LTC       Pending medical progress  *Discharge recommendations are subject to change based on patient's progress and/or home support changes - please refer to most recent PT note for current recommendation    DME recommended for Discharge:   to be determined pending patient progress - patient will likely require a hospital bed     PMP (Progressive Mobility Program) Recommendations:   Recommend patient  dangle edge of bed 2-3 times/day with physical assist and/or supervision of 2 staff as tolerated.     Precautions and Contraindications:   Falls  Mobility protocol   Patient is nonverbal - does best with demonstrated simple cues and automatic movements    Note: Patient's family prefers patient to have shoes donned during mobility.     PT Assessment and Plan of Care (Treatment frequency noted above):   HPI (per physician charting) and Pertinent Medical Details:    Admitted 02/02/2018 with fall with finding of possible tiny acute subdural hematoma on left without mass effect and with scalp laceration. Patient also with noted esophageal foreign body, possible food or mass, on CT of neck. Patient with past medical history significant for CVA with right residual deficits.     Goals:    LTGs: (By d/c)  1. Patient will perform bed mobility with  minimal assistance in prep for out of bed activity. ONGOING  2. Patient will perform sit to stand transfers Minimal assistance (assist x2) in prep for gait. ONGOING  3. Patient will ambulate 10 feet with moderate assistance (assist x2) in prep for home mobility. ONGOING    PT Assessment:  Patients progress towards established goals: Patient demonstrated no noticeable progress toward goals this session. Pt able to perform supine to sit to the left when given verbal cue and with facilitation of sidelying on left, but continues to need mod A x 2 to complete. Dynamic sitting balance showed little improvement with posterior lean seated at EOB while performing LE exercises. Tone noted in R LE which required mod-max A to complete LAQ and ankle pumps. However, tone decreased significantly when pt returned to supine. Pt performed 2 sit/stand transfers with max Ax2 then 2 sit/stand transfers with max A x 3 when given SPC but only able to remain standing about 20 seconds before grunting. Based upon patient's mobility and limited command following, continue to recommend long term care at discharge.      Treatment plan: Continue plan of care     Subjective:   Patient is non-verbal throughout session but participated well with functional mobility and exercises with facilitation and cuing.  Patient/family/caregiver consent to therapy session is noted by the participation in the therapy session.    Pain:  At Rest: 0 /10 per PAINAD scale  With Activity: 2/10 per PAINAD scale   Location: Generalized  Interventions: RN/LPN aware     OBJECTIVE:   Observation of Patient/Vital Signs:   Patient is in bed with Bed/chair alarm on, peripheral IV  Patients medical condition is appropriate for Physical therapy intervention at this time.    Vital Signs:  Stable and no signs of distress.         Oriented to: Unable to assess due to patient being non-verbal and patient not attempting to answer questions via head nods/shakes   Command  following: difficult to assess; pt responded with simple one-step commands with gestures. Best to facilitate with physical assistance to increase pt's participation.   Alertness/Arousal: Delayed responses to stimuli, Inconsistent responses to stimuli     Musculoskeletal and Balance Details:   Tone:  Right Lower extremity:   Hypertonic, sitting EOB. Tone decreased when patient returned to supine.   Left Lower extremity:   WFL = Within Functional Limits, Slightly hypertonic when sitting EOB.  Balance:  Static Sitting:  Fair supervision needed  Dynamic Sitting:  Poor posterior lean with min A to recover LOB during LE exercises at EOB.   Static Standing:  Maximal assistance due to posterior lean          Bed Mobility:   Supine to Sit:   Moderate assist to the left (assist x2 for safety).      , Cues for Hand placement., HOB flat, assist at trunk, assist at LE(s)  Sit to Supine:   Moderate assist to the left (assist x2 for safety) Pt able to initiate this activity but required assistance to complete. .      , Cues for Sequencing., HOB flat, assist at trunk, assist at LE(s)  Seated Scooting:   Maximal assist at EOB    Transfers:  Sit to Stand:  Maximal assist x 2 without AD attempted but patient had strong posterior lean. Pt then performed with max A x 3 while holding SPC on left and could reach upright though he required physical assistance to place left LE underneath BOS instead of anterior and with narrow BOS with No assistive device, Single point cane.         Stand to Sit:  Moderate assist (assist x2 for safety).         4 total trials    Locomotion:  Not tested due to pt unable to maintain static standing for more than 20 seconds due to strong posterior lean.      Other Treatment Interventions this session:   Therapeutic exercise:  Sitting:  Long Arc Quads:  x10 reps bilaterally with AAROM on left LE; PROM on right LE. Patient with inconsistent active participation with exercise    Heel/Toe raises:  x10 reps  bilateraly with AAROM - patient with inconsistent active participation with exercises     Cues, demonstration and facilitation for exercise participation with minimal carryover   Therapeutic activity  Patient/family/caregiver education     Education Provided:   TOPICS: role of physical therapy, plan of care, goals of therapy and benefits of activity, bed mobility with use of adaptive equipment or strategy, activity with nursing, need for assistance at all times for mobility     Learner educated: Patient, Family, niece  Method: Explanation  Response to education: Needs reinforcement and No evidence of learning    Patient Position at End of Treatment:   Supine, in bed, Family/visitors present, Needs in reach, Bed/chair alarm set and No distress  Team Communication:   Spoke to : RN/LPN Mendel Ryder  Regarding: Pre-session re: patient status, Patient position at end of session, Patient participation with Therapy  Whiteboard updated: No - appropriate information already on board   PT/PTA communication: via written note and verbal communication as needed.    Time of treatment:   Time Calculation  PT Received On: 02/09/18  Start Time: 1034  Stop Time: 0104  Time Calculation (min): 20 min    Scarlette Ar, SPT

## 2018-02-09 NOTE — Progress Notes (Signed)
[place] [facility]   Patient Name: [name]   Attending Physician: Barbaraann Faster, MD   Today's date:   02/09/2018 LOS: 2 days   Expected Discharge Date Expected Discharge Date: (pending placement)    Quick  Assessment:                                                              ReAdmit Risk Score: 21    CM Comments: 02/09/18 RNCM: Pt adm s/p fall, subdural hematoma.  PTA lived at home with niece and used cane or walker to amb.  Pain mgmt.  Neuro following.  IVF's.  Abx.  Plan is for pt to be placed in LTC.  Possible bed offers from [facility] and [facility].  Pts medicaid is pending and he will need an auth to get in to facility with his managed medicare.  NCM spoke to niece and she is agreeable to either to [facility] or [facility]. [facility] is her first choice as she lives in [place].  Barriers: Awaiting bed offer and auth from insurance.  NCM still following.    Physical Discharge Disposition: Long Term Care Bed at SNF([facility] or [facility])                                                                                 Provider Notifications:        Eusebio Me RN,BSN  Case Management  Phone number: [phone]

## 2018-02-09 NOTE — Plan of Care (Addendum)
NURSE NOTE SUMMARY  [facility]   Patient Name: [name]   Attending Physician: Barbaraann Faster, MD   Today's date:   02/09/2018 LOS: 2 days   Shift Summary:                                                              Pt remains stable for shift. Incontinent of bowel and bladder. Maintained on IVF. VSS and neuro status stable. Will continue to monotor   Provider Notifications:      Rapid Response Notifications:  Mobility:      PMP Activity: Step 3 - Bed Mobility (02/09/2018  1:43 AM)     Weight tracking:  Family Dynamic:   No data found.          Recent Vitals Last Bowel Movement   BP: 133/63 (02/09/2018  3:55 AM)  Heart Rate: (!) 54 (02/09/2018  3:55 AM)  Temp: 99.3 F (37.4 C) (02/09/2018  3:55 AM)  Resp Rate: 20 (02/09/2018  3:55 AM)  SpO2: 95 % (02/09/2018  3:55 AM)   Last BM Date: 01/24/2018       Problem: Moderate/High Fall Risk Score >5  Goal: Patient will remain free of falls  Outcome: Progressing  Flowsheets (Taken 02/09/2018 0230)  VH High Risk (Greater than 13): ALL REQUIRED LOW INTERVENTIONS; ALL REQUIRED MODERATE INTERVENTIONS; RED "HIGH FALL RISK" SIGNAGE; BED ALARM WILL BE ACTIVATED WHEN THE PATEINT IS IN BED WITH SIGNAGE "RESET BED ALARM"; A CHAIR PAD ALARM WILL BE USED WHEN PATIENT IS UP SITTING IN A CHAIR; PATIENT IS TO BE SUPERVISED FOR ALL TOILETING ACTIVITIES; Use chair-pad alarm device     Problem: Neurological Deficit  Goal: Neurological status is stable or improving  Outcome: Progressing  Flowsheets (Taken 02/09/2018 0230)  Neurological status is stable or improving: Monitor/assess/document neurological assessment (Stroke: every 4 hours); Observe for seizure activity and initiate seizure precautions if indicated     Problem: Impaired Mobility  Goal: Mobility/Activity is maintained at optimal level for patient  Outcome: Progressing  Flowsheets (Taken 02/09/2018 0230)  Mobility/activity is maintained at optimal level for patient: Increase mobility as  tolerated/progressive mobility; Perform active/passive ROM; Encourage independent activity per ability; Maintain proper body alignment; Plan activities to conserve energy, plan rest periods; Reposition patient every 2 hours and as needed unless able to reposition self; Assess for changes in respiratory status, level of consciousness and/or development of fatigue; Consult/collaborate with Physical Therapy and/or Occupational Therapy

## 2018-02-09 NOTE — Provider Clarification Note (Signed)
[facility] PROVIDER CLARIFICATION                                                                       Date of Request:  02/09/2018   Patient Name: [name], [name]  Account #: [id number]   [place] Date: 02/06/2018    Dear Dr. Bennetta Laos    The medical record reflects the following: Progress Note 1/28 documents, "renal function worsening for no apparent reason despite fluids and normal renal/bladder US." Cr 2.30 upon arrival, now 3.46/3.22.     Question to Physician:    Please clarify if there is a diagnosis associated with the rise in Creatinine ie AKI or other with etiology if/when known to accurately capture the Severity of Illness of your patient.     PHYSICIAN RESPONSE:          ----> my impression is he is still in his baseline range..( no AKI)      Alta Corning, RN  [facility] Specialist  [phone]  Date:  02/09/2018         This form is considered part of the permanent legal medical record.

## 2018-02-09 NOTE — Progress Notes (Signed)
Medicine Progress Note   [facility]   Patient Name: [name], [name] LOS: 2 days   Attending Physician: Barbaraann Faster, MD PCP: Melvyn Neth, MD      Hospital Course:                                                            [name] is a 78 y.o. male Ms. past medical history old stroke, CAD,diabetes mellitus, hypertension,stage 4 CKD and dementia who came in for extended for and skin laceration.  Patient has dementia patient cannot provide any medical information history obtained from the ER physician and the chart.  Patient was found to have possible tiny subdural hematoma.  Seen by neurosurgical service, recommend observation overnight and repeat a CT scan of the head if there is no hematoma patient can be discharged.  And hold on Plavix.  The scalp laceration been sutured. Repeat CT showed no evidence of bleed. Meds resumed. Placement pending as family states they cannot take care of him at home.    Has persistent leukocytosis despite zosyn, unclear etiology, possible aspiration. Also renal function worsening for no apparent reason despite fluids and normal renal/bladder US. Consider renal consult if continues to worsen.     Assessment and Plan:      Possible tiny acute subdural hematoma on the left, without mass effect  Due to accidental fall  No neuro deficit  Neurosurgery consulted recommend repeat a CT scan if CT scan no increase of hematoma patient can be discharged  Control blood pressure  Hold Plavix and an aspirin  --> normal repeat CT, can resume meds      Scalp laceration due to accidental fall  S/P suture  CT of the neck neck did not show to fracture or dislocation      Esophageal foreign body possible food or mass  This is detected on the CT of the neck  Will order CT of chest without contrast  --> normal CT      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  Hold aspirin and Plavix due to  hematoma, continue statin  --> resume meds    Stage 4 CKD  Creatinine uptrending despite fluids  Renal ultrasound showed medical renal disease  Cont monitor consider renal consult    ----> my impression is he is still in his baseline range..( no AKI)    Leukocytosis with possible aspiration PNA on CT  - cont zosyn    Hypertension   Continue home BP medications  IV hydralazine for blood pressure greater than 160      Diabetesmellitus  Cont oral meds  Cont sliding scale insulin  Added basal insulin for hyperglycemia        Single-vessel CAD continue aspirin statin       Acute Bilateral PE Dec 2019  No acute pulmonary issue  Resume Eliquis      DVT PPx: Eliquis    Dispo:    family says cannot take care of him at home, PT/OT eval ordered, recs LTC.  Otherwise has been medically stable for discharge    Discussed with case management and nursing    I discussed with patient's caretaker relative today.  We talked about 25 minutes.  I told  her about this poor prognosis going forward.  She was receptive and understanding    Healthcare Proxy: Family    Code: Full code   Subjective     Patient is demented  Cannot have a good Dietitian in the room            Objective   Physical Exam:     Vitals: T:99 F (37.2 C) (Oral), BP:152/76, HR:84, RR:17, SaO2:94%    General: Patient is awake. In no acute distress.  HEENT: No conjunctival drainage, vision is intact, anicteric sclera.  Neck: Supple, no thyromegaly.  Chest: CTA bilaterally. No rhonchi, no wheezing. No use of accessory muscles.  CVS: Normal rate and regular rhythm no murmurs, without JVD, no pitting edema, pulses palpable.  Abdomen: Soft, non-tender, no guarding or rigidity, with normal bowel sounds.  Extremities: No calf swelling and no gross deformity.  Skin: Warm, dry, no rash and no worrisome lesions.  NEURO: No motor or sensory deficits.  Psychiatric: Alert, interactive, appropriate, normal affect.  Weight Monitoring 11/28/2017 12/29/2017  12/29/2017 12/31/2017 01/01/2018 01/17/2018 02/01/2018   Height 167.6 cm - 167.6 cm - - 177.8 cm 177.8 cm   Weight 66.679 kg 64 kg 64 kg 63.2 kg 62.1 kg 64.91 kg 63.7 kg   Weight Method - Actual - Standing Scale Standing Scale - Bed Scale   BMI (calculated) 23.8 kg/m2 - 22.8 kg/m2 - - 20.6 kg/m2 20.2 kg/m2         Intake/Output Summary (Last 24 hours) at 02/09/2018 1642  Last data filed at 02/09/2018 1520  Gross per 24 hour   Intake 860 ml   Output 550 ml   Net 310 ml     Body mass index is 20.15 kg/m.     Meds:     Current Facility-Administered Medications   Medication Dose Route Frequency    amLODIPine  5 mg Oral QAM    apixaban  5 mg Oral Q12H [facility]    clopidogrel  75 mg Oral QAM    finasteride  5 mg Oral QAM    glimepiride  1 mg Oral QAM AC    insulin glargine  12 Units Subcutaneous QHS    insulin lispro (1 Unit Dial)  1-9 Units Subcutaneous TID AC    And    insulin lispro (1 Unit Dial)  1-7 Units Subcutaneous QHS    lactobacillus species  50 Billion CFU Oral Daily    piperacillin-tazobactam  2.25 g Intravenous Q8H    simvastatin  20 mg Oral QHS    sodium bicarbonate  650 mg Oral BID    sodium chloride (PF)  3 mL Intravenous Q8H    vitamin D  25 mcg Oral QAM      sodium chloride 50 mL/hr at 02/09/18 0352     PRN Meds: dextrose, glucagon (rDNA), hydrALAZINE, NSG Communication: Glucose POCT order (AC, HS) **AND** insulin lispro (1 Unit Dial) **AND** insulin lispro (1 Unit Dial) **AND** insulin lispro (1 Unit Dial), naloxone, ondansetron.     LABS:     Estimated Creatinine Clearance: 17.6 mL/min (A) (based on SCr of 3.22 mg/dL (H)).  Recent Labs   Lab 02/08/18  0237 02/07/18  0253   WBC 11.3* 17.4*   RBC 3.64* 4.04   Hemoglobin 11.1* 12.4*   Hematocrit 34.3* 38.0*   MCV 94 94   PLT CT 194 197     Recent Labs   Lab 01/11/2018  1300   PT 12.3*   PT INR  1.2     Recent Labs   Lab 01/30/2018  1300   Creatine Kinase (CK) 375*     Lab Results   Component Value Date    HGBA1CPERCNT 5.7 01/18/2018     Recent Labs   Lab  02/09/18  0407 02/08/18  0237 02/07/18  0253   Glucose 94 95 222*   Sodium 140 138 136   Potassium 4.1 3.5 4.0   Chloride 110 107 102   CO2 22 22 21    BUN 71* 70* 53*   Creatinine 3.22* 3.46* 3.17*   EGFR 20* 19* 21*   Calcium 8.0* 7.8* 8.1*         Recent Labs   Lab 02/07/18  1220   Specific Gravity, UR 1.019   pH, Urine 5.0   Protein, UR 30*   Glucose, UA Negative   Ketones UA Negative   Bilirubin, UA Negative   Blood, UA Small*   Nitrite, UA Negative   Urobilinogen, UA Normal   Leukocyte Esterase, UA Negative   WBC, UA 4   RBC, UA 2   Bacteria, UA Rare*      Patient Lines/Drains/Airways Status    Active PICC Line / CVC Line / PIV Line / Drain / Airway / Intraosseous Line / Epidural Line / ART Line / Line / Wound / Pressure Ulcer / NG/OG Tube     Name:   Placement date:   Placement time:   Site:   Days:    Peripheral IV 02/04/18 Anterior;Left Upper Arm   02/04/18    1610    Upper Arm   less than 1               Ct Head Wo Contrast    Result Date: 02/04/2018  1. Previous possible tiny acute left subdural hematoma no longer well visualized. No enlarging extra-axial fluid collections. 2. 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 No significant change or mass effect. 3. Frontal superficial injury with swelling and subcutaneous gas. ReadingStation:[email or web address]    Ct Head Wo- (rad Read)    Result Date: 02/08/2018  1.  Possible tiny acute subdural hematoma on the left, without mass effect: Dr. Norman Clay was informed of this by telephone at 1354 hours on 03 February 2018. 2.  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 3.  Other chronic intracranial changes as above. 4.  Frontal scalp contusion. ReadingStation:[email or web address]    Ct Chest Wo Contrast    Result Date: 01/30/2018  1. No radiopaque foreign body within the esophagus, previously noted mass or ingested material in the upper thoracic  esophagus has resolved. 2. Nodular infiltrate of the right lung base along the dependent aspect likely the basis of aspiration pneumonia. Would recommend follow-up to resolution. 3. Evidence for granulomatous disease. There is a 3 mm nonspecific right upper lobe nodule. Would recommend a 3-6 month follow-up to evaluate stability. 4. Multiple ill-defined hypodensities within the liver. These are nonspecific however metastatic lesions cannot be excluded. Would recommend dedicated contrasted triphasic CT of the abdomen(include delayed postcontrast evaluation), or contrasted MRI for more definitive evaluation. ReadingStation:[email or web address]    Ct Cervical Spine Wo    Result Date: 01/24/2018  No fractures are identified. Multilevel degenerative facet disease. There is at least mild spinal stenosis at C3-4 and C4-5, and moderate spinal stenosis at C5-6. Some food or a mass is visible in the upper thoracic esophagus, filling the lumen.  ReadingStation:[email or web address]    Fl Modified Barium Swallow W Speech Therapy    Result Date: 02/08/2018  Flash penetration with honey thick consistency. Please see separate speech pathology report for further details. ReadingStation:[email or web address]    US Renal Kidney Bladder Complete    Result Date: 02/07/2018  1. No hydronephrosis. Echogenic kidneys which may be seen with medical renal disease. Possible 6 mm nonobstructing stone in lower pole the left kidney. 2. No abnormalities identified of the urinary bladder. Prostate enlargement. ReadingStation:[email or web address]    Xr Chest Ap Portable    Result Date: 01/20/2018  No acute findings. [name] Needs:  There are no questions and answers to display.       Nutrition assessment done in collaboration with Registered Dietitians:     Time spent:      Barbaraann Faster, MD     02/09/18,4:42 PM   MRN: [id number]                                      CSN: [id number] DOB: May 06, 1940

## 2018-02-09 NOTE — OT Progress Note (Signed)
[facility]: [facility]  Department of Rehabilitation Services: [phone]    [place]    CSN#: [id number]  [facility] [address] [id number]/4505    Occupational Therapy Progress Note    Patients medical condition is appropriate for Occupational therapy intervention at this time.    Time of treatment:   Time Calculation  OT Received On: 02/09/18  Start Time: 1039  Stop Time: 1053  Time Calculation (min): 14 min    Visit#: 2    Medical Diagnosis/Pertinent medical/surgical details: after fall and striking head at home. Patient diagnosed with possible tiny left frontal SDH, however, not appreciated on imaging by Dr. Willette Alma at time of arrival to [facility]. Repeat CT head shows no evidence of Subdural Hematoma or acute intracranial injury.    Patient with past medical history significant for CVA with right sided deficits and dementia. Patient is nonverbal    Precautions and Contraindications:   Falls  Mobility protocol   Patient is nonverbal - does best with demonstrated simple cues and automatic movements  Patient's family prefers patient to have shoes donned during mobility    Assessment:   Patients progress towards established goals: patient makes automatic attempt to participate in LB dressing for left LE. Patient reached down to pull up left sock with success and fastened straps on left shoe with set-up assistance while seated at EOB. Patient is not successful with cues to attempt right LE LB dressing tasks. Patient kept right UE in elbow flexion throughout session. Patient needing maximum assistance x2-3 people with bilateral knees/feet blocked and SPT cane. Patient unable to tolerate standing position for longer than 30 seconds. Patient's niece present at end of session to see last standing attempt.     Patient continues to have the following impairments: decreased ROM, decreased strength, balance deficits, decreased independence with ADLs, decreased independence with IADLs, decreased activity  tolerance, decrease safety awareness, decreased attention, decreased cognition, decreased problem solving, decreased functional mobility, decreased functional transfers    Patient will continue to benefit from skilled OT services in order to increase independence and safety with ADLs and functional transfers/mobility.        Goals:   In 3 to 4 visits:  1. Patient will donn/doff socks with Moderate assist and use of AE/AT prn. ONGOING  2. Patient will thread pants/undergarments over knees with Maximal assist and use of AE/AT/AD prn. ONGOING  3. Patient will stand and arrange pants/undergarments over hips with Maximal assist and use of AE/AT/AD prn.  ONGOING  4. Patient will donn/doff shirt/gown with Moderate assist and use of AT/AE prn. ONGOING  5. Patient will perform BSC/chair transfer with Maximal assist (assist x2 for safety) and use of AD/DME prn. ONGOING  6. Patient will perform 2 grooming tasks with Supervision after set-up assistance while seated at EOB and use of AE/AT prn. ONGOING    LTG (by d/c):   1. Patient will be Moderate assist for ADLs, Moderate assist for functional transfers/mobility with use of AD/DME/AT/AE. ONGOING      Plan:   Treatment/interventions: ADL retraining, functional transfer training, activity pacing, cognition (safety awareness, problem solving, etc.), patient/family training, equipment eval/education, compensatory technique education, continued evaluation    Treatment Frequency: OT Frequency Recommended: 2-3x/wk     DISCHARGE RECOMMENDATIONS   DME recommended for Discharge:   TBD closer to d/c  TBD at next level of care    Discharge Recommendations:   SNF;Other(Comment)         Subjective:   Patient  nonverbal - however, patient did state "no" per niece's report during standing attempts.  Patient is agreeable to participation in the therapy session. Nursing clears patient for therapy.    Pain:  Unknown    Objective:   Observation of Patient:    Patient is seated at edge of bed with  PT present.            Vital Signs:   Stable with no signs/symptoms of distress     Command following: Follows 1 step commands with increased time, Follows 1 step commands with repetition, 75% of the time, with DEMONSTRATION     Behavior: Cooperative, Flat affect  Cognitive Deficits: insight, judgment, sequencing, problem solving, organization    Balance:  Static Sitting:  Fair+  Dynamic Sitting:  Fair Fair-  Static Standing:  Poor-  Dynamic Standing:  Not tested    Activities of Daily Living:   LB Dressing: Patient reached down to pull up left sock with success and fastened straps on left shoe with set-up assistance while seated at EOB. Patient is not successful with cues to attempt right LE LB dressing tasks.    Functional Mobility:   Bed Mobility:  Supine scooting:  Maximal assist (assist x2 for safety)   Sit to Supine:   Maximal assist (assist x2 for safety).          Transfers:  Sit to Stand:  Maximal assist x 2-3 people with bilateral feet/knees blocked with SPT cane and gait belt  Stand to Sit:  Maximal assist (assist x2 for safety).          Participation and Activity Tolerance   Participation effort: Good  Activity Tolerance: Tolerates 10-20 minutes of activity with multiple rests    Other Treatment Interventions:   Treatment Activities:   Not applicable          Education Provided:   Topics: Role of occupational therapy, plan of care, goals of therapy and safety with mobility and ADLs, benefits of activity, home safety, bed mobility with use of adaptive equipment or strategy, activity with nursing.    Individuals educated: Patient, Family.  Method: Explanation.  Response to education: Questionable understanding.     Team Communication:   OT communicated with: RN/LPN - Mendel Ryder, PT Hinton Dyer  Co-treatment with PT secondary to patient's poor activity tolerance  OT communicated regarding: Pre-session re: patient status, Patient position at end of session, Discharge needs, Patient participation with Therapy, Vital  signs, Further recommendations  OT/COTA communication: via written note and verbal communication as needed.    Supine, in bed, in the room, Family/visitors present, Needs in reach, Bed/chair alarm set and No distress    Recommend client be assisted to participate in UB ADLs as tolerated in bed outside of and in addition to OT session.    [place] Yostin Malacara, OTR/L

## 2018-02-09 NOTE — Plan of Care (Addendum)
NURSE NOTE SUMMARY  [facility] [place]   Patient Name: [name]   Attending Physician: Barbaraann Faster, MD   Today's date:   02/10/2018 LOS: 3 days   Shift Summary:                                                              Pt placed on tele, HR ranging from 56-150, MD aware. Slept throughout shift, continuous IV fluids. Call bell within reach.   Provider Notifications:      Rapid Response Notifications:  Mobility:      PMP Activity: Step 3 - Bed Mobility (02/09/2018  7:29 PM)     Weight tracking:  Family Dynamic:   No data found.          Recent Vitals Last Bowel Movement   BP: 154/73 (02/10/2018  4:39 AM)  Heart Rate: 97 (02/10/2018  4:39 AM)  Temp: 99.5 F (37.5 C) (02/10/2018  4:39 AM)  Resp Rate: 16 (02/10/2018  4:39 AM)  SpO2: 97 % (02/10/2018  4:39 AM)   Last BM Date: 01/17/2018           Problem: Compromised Tissue integrity  Goal: Damaged tissue is healing and protected  Outcome: Progressing  Flowsheets (Taken 02/07/2018 0458)  Damaged tissue is healing and protected : Monitor/assess Braden scale every shift;Provide wound care per wound care algorithm;Reposition patient every 2 hours and as needed unless able to reposition self;Increase activity as tolerated/progressive mobility;Relieve pressure to bony prominences for patients at moderate and high risk;Keep intact skin clean and dry;Avoid shearing injuries;Use bath wipes, not soap and water, for daily bathing;Use incontinence wipes for cleaning urine, stool and caustic drainage. Foley care as needed;Monitor external devices/tubes for correct placement to prevent pressure, friction and shearing  Goal: Nutritional status is improving  Outcome: Progressing  Flowsheets (Taken 02/07/2018 0458)  Nutritional status is improving: Assist patient with eating;Allow adequate time for meals;Encourage patient to take dietary supplement(s) as ordered;Collaborate with Clinical Nutritionist;Include patient/patient care companion in decisions  related to nutrition

## 2018-02-09 NOTE — Plan of Care (Addendum)
NURSE NOTE SUMMARY  [facility]   Patient Name: [name]   Attending Physician: Barbaraann Faster, MD   Today's date:   02/09/2018 LOS: 2 days   Shift Summary:                                                              Pt incontinent of urine/bm and changed/bathed. Pt took meds crushed in pudding. VSS. No neuro changes noted. Bed alarm on with call bell in place.    Provider Notifications:      Rapid Response Notifications:  Mobility:      PMP Activity: Step 3 - Bed Mobility (02/09/2018  5:00 PM)     Weight tracking:  Family Dynamic:   No data found.          Recent Vitals Last Bowel Movement   BP: 152/76 (02/09/2018  3:08 PM)  Heart Rate: 84 (02/09/2018  3:08 PM)  Temp: 99 F (37.2 C) (02/09/2018  3:08 PM)  Resp Rate: 17 (02/09/2018  3:08 PM)  SpO2: 94 % (02/09/2018  3:08 PM)   Last BM Date: 01/18/2018       Problem: Moderate/High Fall Risk Score >5  Goal: Patient will remain free of falls  Outcome: Progressing     Problem: Potential for Aspiration  Goal: Risk of aspiration will be minimized  Outcome: Progressing

## 2018-02-10 LAB — BASIC METABOLIC PANEL
Anion Gap: 13.8 mMol/L (ref 7.0–18.0)
BUN / Creatinine Ratio: 24.2 Ratio (ref 10.0–30.0)
BUN: 64 mg/dL — ABNORMAL HIGH (ref 7–22)
CO2: 21 mMol/L (ref 20–30)
Calcium: 7.8 mg/dL — ABNORMAL LOW (ref 8.5–10.5)
Chloride: 111 mMol/L — ABNORMAL HIGH (ref 98–110)
Creatinine: 2.65 mg/dL — ABNORMAL HIGH (ref 0.80–1.30)
EGFR: 26 mL/min/{1.73_m2} — ABNORMAL LOW (ref 60–150)
Glucose: 105 mg/dL — ABNORMAL HIGH (ref 71–99)
Osmolality Calculated: 302 mOsm/kg — ABNORMAL HIGH (ref 275–300)
Potassium: 3.8 mMol/L (ref 3.5–5.3)
Sodium: 142 mMol/L (ref 136–147)

## 2018-02-10 LAB — VH DEXTROSE STICK GLUCOSE
Glucose POCT: 83 mg/dL (ref 71–99)
Glucose POCT: 158 mg/dL — ABNORMAL HIGH (ref 71–99)
Glucose POCT: 200 mg/dL — ABNORMAL HIGH (ref 71–99)
Glucose POCT: 203 mg/dL — ABNORMAL HIGH (ref 71–99)

## 2018-02-10 MED ORDER — AMLODIPINE BESYLATE 5 MG PO TABS
5.00 mg | ORAL_TABLET | Freq: Once | ORAL | Status: AC
Start: 2018-02-10 — End: 2018-02-10
  Administered 2018-02-10: 21:00:00 5 mg via ORAL
  Filled 2018-02-10: qty 1

## 2018-02-10 NOTE — Progress Notes (Signed)
,    [facility] [place]   Patient Name: [name]   Attending Physician: Barbaraann Faster, MD   Today's date:   02/10/2018 LOS: 3 days   Expected Discharge Date Expected Discharge Date: (pending placement)    Quick  Assessment:                                                              ReAdmit Risk Score: 21    CM Comments: 02/10/18 RNCM: Pt adm s/p fall, subdural hematoma.  PTA lived with niece as pcg.  Plan is for pt to be placed in LTC facility.  Medicaid is pending, but [facility] has offered a bed and started auth today for pt to come under his [employer] HMO.  Pts niece is agreeable to bed offer at [facility].  IVF's.  IV abx.  Fever.  NCM will continue to follow.    Physical Discharge Disposition: Long Term Care Bed at SNF                                                                               Provider Notifications:        Eusebio Me RN,BSN  Case Management  Phone number: [phone]

## 2018-02-10 NOTE — Progress Notes (Addendum)
Medicine Progress Note   [facility]   Patient Name: [name], [name] LOS: 3 days   Attending Physician: Barbaraann Faster, MD PCP: Melvyn Neth, MD      Hospital Course:                                                            Hendricks Milo Junior Hoffman is a 78 y.o. male Ms. past medical history old stroke, CAD,diabetes mellitus, hypertension,stage 4 CKD and dementia who came in for extended for and skin laceration.  Patient was recently dced on plavix and eliquis . Eliquis was added on last admit bc of PE and DVT. Patient has dementia patient cannot provide any medical information history obtained from the ER physician and the chart.  Patient was found to have possible tiny subdural hematoma.  Seen by neurosurgical service, recommend observation overnight and repeat a CT scan of the head if there is no hematoma patient can be discharged.  initially held  Plavix/eliquis.  The scalp laceration been sutured. Repeat CT showed no evidence of bleed. Meds resumed. Placement pending as family states they cannot take care of him at home.    Has persistent leukocytosis likely from aspiration PNA.  Cr range seems WNL as per his baseline ckd 4.       Assessment and Plan:      Possible tiny acute subdural hematoma on the left, without mass effect  Due to accidental fall  No neuro deficit  Neurosurgery consulted --recommend repeat a CT scan if CT scan no increase of hematoma patient can be discharged  Control blood pressure  --> normal repeat CT, plavix and eliquis resumed on 1/26th     Scalp laceration due to accidental fall  S/P suture  CT of the neck neck did not show to fracture or dislocation      Esophageal foreign body possible food or mass ??  This is detected on the CT of the neck   CT of chest without contrast  --> no such esophageal finding       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   Plavix resumed , continue  statin    Stage 4 CKD  Creatinine uptrending despite fluids  Renal ultrasound showed medical renal disease  Cont monitor consider renal consult    ----> my impression is he is still in his baseline range..( no AKI)    Right sided Aspiration PNA on CT--POA  --> had MBSS-- has op mod dysphagia --diet modified  --  MRSA nares negative  -- Influenza A n B -- neg     - cont zosynday 7  -- check procalcitonin level AM     Mod OP dysphagia -- likely from demential on hx of CVA.Marland Kitchen diet adjusted per SLP ( had MBSS)    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 cant use ace bc of ckd   IV hydralazine for blood pressure greater than 160  Give additional amlodipine tonight  Melfa IVF      Diabetesmellitus  Cont oral meds  Cont sliding scale insulin  basal  insulin         Single-vessel CAD --stable --continue plavix and  statin    Intermittent brady --no BB    Acute Bilateral PE diagnosed Dec 2019  No new acute pe this admit  Eliquisresumed       DVT PPx: Eliquis      Poor long term prognosis    Dispo:   Family says cannot take care of him at home, PT/OT eval ordered, recs LTC.  Otherwise has been medically stable for discharge    Discussed with case management and nursing    I discussed with patient's caretaker relative 1/30.   I told her about this poor prognosis going forward.  She was receptive and understanding    Healthcare Proxy: Family    Code: Full code   Subjective     Patient is demented  Cannot have a good communication   tmax  100.6 last night     NAD      Objective   Physical Exam:     Vitals: T:98.6 F (37 C) (Oral), BP:167/77, HR:(!) 107, RR:16, SaO2:93%    General: Patient is awake. In no acute distress.  HEENT: No conjunctival drainage, vision is intact, anicteric sclera.  Neck: Supple, no thyromegaly.  Chest: CTA bilaterally. No rhonchi, no wheezing. No use of accessory  muscles.  CVS: Normal rate and regular rhythm no murmurs, without JVD, no pitting edema, pulses palpable.  Abdomen: Soft, non-tender, no guarding or rigidity, with normal bowel sounds.  Extremities: No calf swelling and no gross deformity.  Skin: Warm, dry, no rash and no worrisome lesions.  NEURO: No motor or sensory deficits.  Psychiatric: Alert, interactive, appropriate, normal affect.  Weight Monitoring 11/28/2017 12/29/2017 12/29/2017 12/31/2017 01/01/2018 01/17/2018 02/09/2018   Height 167.6 cm - 167.6 cm - - 177.8 cm 177.8 cm   Weight 66.679 kg 64 kg 64 kg 63.2 kg 62.1 kg 64.91 kg 63.7 kg   Weight Method - Actual - Standing Scale Standing Scale - Bed Scale   BMI (calculated) 23.8 kg/m2 - 22.8 kg/m2 - - 20.6 kg/m2 20.2 kg/m2         Intake/Output Summary (Last 24 hours) at 02/10/2018 1521  Last data filed at 02/09/2018 1818  Gross per 24 hour   Intake 120 ml   Output    Net 120 ml     Body mass index is 20.15 kg/m.     Meds:     Current Facility-Administered Medications   Medication Dose Route Frequency    amLODIPine  5 mg Oral QAM    apixaban  5 mg Oral Q12H [facility]    clopidogrel  75 mg Oral QAM    finasteride  5 mg Oral QAM    glimepiride  1 mg Oral QAM AC    insulin glargine  12 Units Subcutaneous QHS    insulin lispro (1 Unit Dial)  1-9 Units Subcutaneous TID AC    And    insulin lispro (1 Unit Dial)  1-7 Units Subcutaneous QHS    lactobacillus species  50 Billion CFU Oral Daily    piperacillin-tazobactam  2.25 g Intravenous Q8H    simvastatin  20 mg Oral QHS    sodium bicarbonate  650 mg Oral BID    sodium chloride (PF)  3 mL Intravenous Q8H    vitamin D  25 mcg Oral QAM      sodium chloride 50 mL/hr at 02/10/18 0331     PRN Meds: acetaminophen, dextrose, glucagon (rDNA),  hydrALAZINE, NSG Communication: Glucose POCT order (AC, HS) **AND** insulin lispro (1 Unit Dial) **AND** insulin lispro (1 Unit Dial) **AND** insulin lispro (1 Unit Dial), naloxone, ondansetron.     LABS:     Estimated  Creatinine Clearance: 21.4 mL/min (A) (based on SCr of 2.65 mg/dL (H)).  Recent Labs   Lab 02/08/18  0237 02/07/18  0253   WBC 11.3* 17.4*   RBC 3.64* 4.04   Hemoglobin 11.1* 12.4*   Hematocrit 34.3* 38.0*   MCV 94 94   PLT CT 194 197             Lab Results   Component Value Date    HGBA1CPERCNT 5.7 01/23/2018     Recent Labs   Lab 02/10/18  0347 02/09/18  0407 02/08/18  0237   Glucose 105* 94 95   Sodium 142 140 138   Potassium 3.8 4.1 3.5   Chloride 111* 110 107   CO2 21 22 22    BUN 64* 71* 70*   Creatinine 2.65* 3.22* 3.46*   EGFR 26* 20* 19*   Calcium 7.8* 8.0* 7.8*         Recent Labs   Lab 02/07/18  1220   Specific Gravity, UR 1.019   pH, Urine 5.0   Protein, UR 30*   Glucose, UA Negative   Ketones UA Negative   Bilirubin, UA Negative   Blood, UA Small*   Nitrite, UA Negative   Urobilinogen, UA Normal   Leukocyte Esterase, UA Negative   WBC, UA 4   RBC, UA 2   Bacteria, UA Rare*      Patient Lines/Drains/Airways Status    Active PICC Line / CVC Line / PIV Line / Drain / Airway / Intraosseous Line / Epidural Line / ART Line / Line / Wound / Pressure Ulcer / NG/OG Tube     Name:   Placement date:   Placement time:   Site:   Days:    Peripheral IV 02/04/18 Anterior;Left Upper Arm   02/04/18    1610    Upper Arm   less than 1               Ct Head Wo Contrast    Result Date: 02/04/2018  1. Previous possible tiny acute left subdural hematoma no longer well visualized. No enlarging extra-axial fluid collections. 2. 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 No significant change or mass effect. 3. Frontal superficial injury with swelling and subcutaneous gas. ReadingStation:[email or web address]    Ct Chest Wo Contrast    Result Date: 01/30/2018  1. No radiopaque foreign body within the esophagus, previously noted mass or ingested material in the upper thoracic esophagus has resolved. 2. Nodular infiltrate of the right lung base along the dependent aspect likely the  basis of aspiration pneumonia. Would recommend follow-up to resolution. 3. Evidence for granulomatous disease. There is a 3 mm nonspecific right upper lobe nodule. Would recommend a 3-6 month follow-up to evaluate stability. 4. Multiple ill-defined hypodensities within the liver. These are nonspecific however metastatic lesions cannot be excluded. Would recommend dedicated contrasted triphasic CT of the abdomen(include delayed postcontrast evaluation), or contrasted MRI for more definitive evaluation. ReadingStation:[email or web address]    Fl Modified Barium Swallow W Speech Therapy    Result Date: 02/08/2018  Flash penetration with honey thick consistency. Please see separate speech pathology report for further details. ReadingStation:[email or web address]    US Renal Kidney Bladder Complete    Result Date: 02/07/2018  1. No hydronephrosis. Echogenic kidneys which may be seen with medical renal disease. Possible 6 mm nonobstructing stone in lower pole the left kidney. 2. No abnormalities identified of the urinary bladder. Prostate enlargement. [place] Needs:  There are no questions and answers to display.       Nutrition assessment done in collaboration with Registered Dietitians:     Time spent:      Barbaraann Faster, MD     02/10/18,3:21 PM   MRN: [id number]                                      CSN: [id number] DOB: Sep 10, 1940

## 2018-02-10 NOTE — SLP Progress Note (Signed)
[facility]  Speech Language Pathology   Daily Progress Note    Patient: [name]    CSN#: [id number]    ASSESSMENT/PLAN/RECOMMENDATIONS:     SLP diagnosis:  Moderate Oropharyngeal Dysphagia    Diet recommendations:  Pureed diet, Moderately Thick (Honey thick liquids), Meds crushed in applesauce/pureed    Projected Discharge Recommendations:  No further SLP needs    Prognosis:  Fair d/t multiple medical complications     Continue therapy for:    x Discontinue therapy D/C; No further SLP needs at this time     Goals: Pt will:  STG: (In 3-4 days)  1.         SUBJECTIVE:      Subjective: Patient is agreeable to participation in the therapy session. Nursing clears patient for therapy. Patients medical condition is appropriate for Speech therapy intervention at this time.    S:   Pt nonverbal; nodded yes to participate in PO trials.       OBJECTIVE:     O: SLP Treatment:  Pt was seen for SLP tx with progress as follows:   Pt awake/alert upon SLP arrival.  Pt seen for diet tolerance assessment following MBSS yesterday, 1/30.  NSG reports pt tolerating diet free of overt signs/symptoms of aspiration.  Stated they attempted meds whole in puree, though pt sputtered them out.  No difficulty with meds crushed.  Pt consumed x4 bites of puree and x12 sips of honey thick liquid (single and consecutive) free of overt signs/symptoms of aspiration.   Spoke with pt/NSG re: aspiration precautions and recommendation to continue current diet.  Pt with no further acute SLP needs at this time as pt unable to follow commands for active participation in swallow tx.    Pt/Family/Caregiver Education Provided:     Patient was/were educated re: sign/symptoms/risks of aspiration, safe feeding strategies, tx goals, progress on goals, d/c recommendations  Good understanding was verbalized/demonstrated: incomplete  Comprehension limited by: impaired cognitive-communication skills    Team Communication:     Spoke to : RN/LPN Mendel Ryder  Regarding: tx goals, d/c recommendations, progress on goals  Good understanding was verbalized: yes                          Time of treatment:   SLP Received On: 02/10/18  Start Time: 1254  Stop Time: 1303  Time Calculation (min): 9 min    SLP Visit Number: 4      Completed by:  Garlan Fair, M.S., [place]  Speech-Language Pathologist, [place] via [place], [place] Phone [phone], or Pager [phone]

## 2018-02-10 NOTE — PT Progress Note (Signed)
[facility]: [facility]  Patient: [name]     CSN: [id number]    Bed: 4505/4505  Physical Therapy PROGRESS note   Visit#: (4)   Treatment Frequency: 2-3x/wk  Last seen by a physical therapist vs. Physical therapist assistant: 02/09/2018    DISCHARGE RECOMMENDATIONS   Discharge Recommendations:   LTC       *Discharge recommendations are subject to change based on patient's progress and/or home support changes - please refer to most recent PT note for current recommendation    DME recommended for Discharge:   TBD at next level of care    PMP (Progressive Mobility Program) Recommendations:   Recommend patient  dangle edge of bed 2 times/day with physical assist and/or supervision of 2 staff as tolerated.     Precautions and Contraindications:     Falls  Mobility protocol   Patient is nonverbal - does best with demonstrated simple cues and automatic movements     Note: Patient's family prefers patient to have shoes donned during mobility.     PT Assessment and Plan of Care (Treatment frequency noted above):   HPI (per physician charting) and Pertinent Medical Details:    Admitted 01/31/2018 with/for fall with finding of possible tiny acute subdural hematoma on left without mass effect and with scalp laceration. Patient also with noted esophageal foreign body, possible food or mass, on CT of neck. Patient with past medical history significant for CVA with right residual deficits.       Goals:     LTGs: (By d/c)  1. Patient will perform bed mobility with minimal assistance in prep for out of bed activity. ONGOING  2. Patient will perform sit to stand transfers Minimal assistance (assist x2) in prep for gait. ONGOING  3. Patient will ambulate 10 feet with moderate assistance (assist x2) in prep for home mobility. ONGOING    PT Assessment:  Patients progress towards established goals: no progress noted;briefly sat EOB with close supervision to minimal  assist;grunting/resisitive to seated scoot/standing activity with no gluteal clearance.    Treatment plan: Continue plan of care     Subjective:   No verbal, appeared willing as assisting with bed mobility   Patient/family/caregiver consent to therapy session is noted by the participation in the therapy session.    Pain:  At Rest: 0 /10  With Activity: 0/10  Location: N/A  Interventions: None required    OBJECTIVE:   Observation of Patient/Vital Signs:   Patient is in bed with Bed/chair alarm on, telemetry, peripheral IV  Patients medical condition is appropriate for Physical therapy intervention at this time.    Vital Signs:  Stable with no signs/symptoms of distress                  Musculoskeletal and Balance Details:   Balance:  Static Sitting:  Fair    sits with L UE assist, close supervision to minimal assist ONCE STABILIZED;posterior drift until positioned closer to EOB      Bed Mobility:   Supine to Sit:   Moderate assist (assist x2 for safety).   Cues for Sequencing., Cues for Hand placement., HOB elevated, Bed rail used, assist at trunk, assist at LE(s)  Sit to Supine:   Moderate assist (assist x2 for safety).   Cues for Sequencing., Cues for Hand placement., HOB flat, assist at trunk, assist at LE(s)  Seated Scooting:   Moderate assist to EOB;unable to perform to [facility] multiple attempts    Transfers:  no gluteal clearance noted;resistive to anterior weight shift          Other Treatment Interventions this session:   Therapeutic activity  Patient/family/caregiver education     Education Provided:   TOPICS: role of physical therapy, plan of care, goals of therapy and safety with mobility and ADLs, benefits of activity, activity with nursing    Learner educated: Patient  Method: Explanation  Response to education: Needs reinforcement    Patient Position at End of Treatment:   Supine, in bed, Needs in reach, Bed/chair alarm set and No distress    Team Communication:   Spoke to : RN/LPN - Mendel Ryder  Regarding:  Pre-session re: patient status, Patient participation with Therapy  Whiteboard updated: left as stated  PT/PTA communication: via written note and verbal communication as needed.    Time of treatment:   Time Calculation  PT Received On: 02/10/18  Start Time: 0907  Stop Time: 0918  Time Calculation (min): 11 min    Dede Query

## 2018-02-10 NOTE — Plan of Care (Addendum)
NURSE NOTE SUMMARY  [facility]   Patient Name: [name]   Attending Physician: Barbaraann Faster, MD   Today's date:   02/10/2018 LOS: 3 days   Shift Summary:                                                              Pt had two loose BM this shift. PRN hydralazine given for elevated blood pressure. Pt had an oral temp of 100.4 this evening and prn tylenol given. Pt took meds crushed in pudding. Oral care done and patient bathed. Bed alarm on. Will continue to monitor.     Provider Notifications:      Rapid Response Notifications:  Mobility:      PMP Activity: Step 3 - Bed Mobility (02/10/2018  4:00 PM)     Weight tracking:  Family Dynamic:   No data found.          Recent Vitals Last Bowel Movement   BP: (!) 170/92 (02/10/2018  5:57 PM)  Heart Rate: 73 (02/10/2018  5:57 PM)  Temp: 100.4 F (38 C) (02/10/2018  5:57 PM)  Resp Rate: (!) 24 (02/10/2018  5:57 PM)  SpO2: 97 % (02/10/2018  5:57 PM)   Last BM Date: 02/02/2018       Problem: Moderate/High Fall Risk Score >5  Goal: Patient will remain free of falls  Outcome: Progressing     Problem: Compromised Tissue integrity  Goal: Damaged tissue is healing and protected  Outcome: Progressing  Goal: Nutritional status is improving  Outcome: Progressing     Problem: Neurological Deficit  Goal: Neurological status is stable or improving  Outcome: Progressing

## 2018-02-11 ENCOUNTER — Inpatient Hospital Stay: Payer: Medicare Other

## 2018-02-11 LAB — COMPREHENSIVE METABOLIC PANEL
ALT: 78 U/L — ABNORMAL HIGH (ref 0–55)
AST (SGOT): 36 U/L (ref 10–42)
Albumin/Globulin Ratio: 0.69 Ratio — ABNORMAL LOW (ref 0.80–2.00)
Albumin: 2.2 gm/dL — ABNORMAL LOW (ref 3.5–5.0)
Alkaline Phosphatase: 61 U/L (ref 40–145)
Anion Gap: 13.9 mMol/L (ref 7.0–18.0)
BUN / Creatinine Ratio: 22.1 Ratio (ref 10.0–30.0)
BUN: 57 mg/dL — ABNORMAL HIGH (ref 7–22)
Bilirubin, Total: 0.4 mg/dL (ref 0.1–1.2)
CO2: 19 mMol/L — ABNORMAL LOW (ref 20–30)
Calcium: 8.2 mg/dL — ABNORMAL LOW (ref 8.5–10.5)
Chloride: 111 mMol/L — ABNORMAL HIGH (ref 98–110)
Creatinine: 2.58 mg/dL — ABNORMAL HIGH (ref 0.80–1.30)
EGFR: 27 mL/min/{1.73_m2} — ABNORMAL LOW (ref 60–150)
Globulin: 3.2 gm/dL (ref 2.0–4.0)
Glucose: 133 mg/dL — ABNORMAL HIGH (ref 71–99)
Osmolality Calculated: 297 mOsm/kg (ref 275–300)
Potassium: 3.9 mMol/L (ref 3.5–5.3)
Protein, Total: 5.4 gm/dL — ABNORMAL LOW (ref 6.0–8.3)
Sodium: 140 mMol/L (ref 136–147)

## 2018-02-11 LAB — CBC AND DIFFERENTIAL
Basophils %: 0.4 % (ref 0.0–3.0)
Basophils Absolute: 0.1 10*3/uL (ref 0.0–0.3)
Eosinophils %: 0.5 % (ref 0.0–7.0)
Eosinophils Absolute: 0.1 10*3/uL (ref 0.0–0.8)
Hematocrit: 33.2 % — ABNORMAL LOW (ref 39.0–52.5)
Hemoglobin: 10.9 gm/dL — ABNORMAL LOW (ref 13.0–17.5)
Lymphocytes Absolute: 1.6 10*3/uL (ref 0.6–5.1)
Lymphocytes: 10.9 % — ABNORMAL LOW (ref 15.0–46.0)
MCH: 31 pg (ref 28–35)
MCHC: 33 gm/dL (ref 32–36)
MCV: 95 fL (ref 80–100)
MPV: 8.2 fL (ref 6.0–10.0)
Monocytes Absolute: 1.5 10*3/uL (ref 0.1–1.7)
Monocytes: 10.6 % (ref 3.0–15.0)
Neutrophils %: 77.6 % (ref 42.0–78.0)
Neutrophils Absolute: 11 10*3/uL — ABNORMAL HIGH (ref 1.7–8.6)
PLT CT: 259 10*3/uL (ref 130–440)
RBC: 3.51 10*6/uL — ABNORMAL LOW (ref 4.00–5.70)
RDW: 11.9 % (ref 11.0–14.0)
WBC: 14.2 10*3/uL — ABNORMAL HIGH (ref 4.0–11.0)

## 2018-02-11 LAB — VH DEXTROSE STICK GLUCOSE
Glucose POCT: 116 mg/dL — ABNORMAL HIGH (ref 71–99)
Glucose POCT: 91 mg/dL (ref 71–99)
Glucose POCT: 122 mg/dL — ABNORMAL HIGH (ref 71–99)
Glucose POCT: 142 mg/dL — ABNORMAL HIGH (ref 71–99)
Glucose POCT: 162 mg/dL — ABNORMAL HIGH (ref 71–99)

## 2018-02-11 LAB — PROCALCITONIN: Procalcitonin: 0.45 ng/mL — ABNORMAL HIGH (ref 0.00–0.24)

## 2018-02-11 NOTE — Progress Notes (Addendum)
Medicine Progress Note   [facility]   Patient Name: [name], [name] LOS: 4 days   Attending Physician: Barbaraann Faster, MD PCP: Melvyn Neth, MD      Hospital Course:                                                            [name] is a 78 y.o. male Ms. past medical history old stroke, CAD,diabetes mellitus, hypertension,stage 4 CKD and dementia who came in for extended for and skin laceration.  Patient was recently dced on plavix and eliquis . Eliquis was added on last admit bc of PE and DVT. Patient has dementia patient cannot provide any medical information history obtained from the ER physician and the chart.  Patient was found to have possible tiny subdural hematoma.  Seen by neurosurgical service, recommend observation overnight and repeat a CT scan of the head if there is no hematoma patient can be discharged.  initially held  Plavix/eliquis.  The scalp laceration been sutured. Repeat CT showed no evidence of bleed. Meds resumed. Placement pending as family states they cannot take care of him at home.    Has persistent leukocytosis likely from aspiration PNA.  Cr range seems WNL as per his baseline ckd 4.       Assessment and Plan:      Possible tiny acute subdural hematoma on the left, without mass effect  Due to accidental fall  No neuro deficit  Neurosurgery consulted --recommend repeat a CT scan if CT scan no increase of hematoma patient can be discharged  Control blood pressure  --> normal repeat CT, plavix and eliquis resumed on 1/26th     Scalp laceration due to accidental fall  S/P suture  CT of the neck neck did not show to fracture or dislocation      Esophageal foreign body possible food or mass ??  This is detected on the CT of the neck   CT of chest without contrast  --> no such esophageal finding       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   Plavix resumed , continue  statin    Stage 4 CKD  Creatinine uptrending despite fluids  Renal ultrasound showed medical renal disease  Cont monitor consider renal consult    ----> my impression is he is still in his baseline range..( no AKI)    Right sided Aspiration PNA on CT--POA  --> had MBSS-- has op mod dysphagia --diet modified  --  MRSA nares negative  -- Influenza A n B -- neg     - cont zosynday 8  -- >> WBC started going up again with some fever.  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 cant use ace bc of ckd   IV hydralazine for blood pressure greater than 160  s/p IVF      Diabetesmellitus  Cont oral meds  Cont sliding scale insulin  basal insulin         Single-vessel CAD --stable --continue plavix and  statin    Intermittent brady --no BB    Acute Bilateral PE diagnosed Dec 2019  No new acute pe this admit  Eliquisresumed       DVT PPx: Eliquis      Poor long term prognosis    Dispo:   Family says cannot take care of him at home, PT/OT eval ordered, recs LTC.  Otherwise has been medically stable for discharge    Discussed with case management and nursing    I discussed with patient's POA again 2/1  Very receptive    I told her about this poor prognosis going forward.  She was receptive and understanding    Healthcare Proxy: Family    Code:  DNR    Subjective     Patient is demented  Cannot have a good communication  NAD      Objective   Physical Exam:     Vitals: T:(!) 100.8 F (38.2 C) (Oral), BP:166/69, HR:90, RR:17, SaO2:97%    General: Patient is awake. In no acute distress.  HEENT: No conjunctival drainage, vision is intact, anicteric sclera.  Neck: Supple, no  thyromegaly.  Chest: mild rhonchi b/l  no wheezing. No use of accessory muscles.  CVS: Normal rate and regular rhythm no murmurs, without JVD, no pitting edema, pulses palpable.  Abdomen: Soft, non-tender, no guarding or rigidity, with normal bowel sounds.  Extremities: No calf swelling and no gross deformity.  Skin: Warm, dry, no rash and no worrisome lesions.  NEURO: No motor or sensory deficits.  Psychiatric: Alert, less interactive,   Weight Monitoring 11/28/2017 12/29/2017 12/29/2017 12/31/2017 01/01/2018 01/17/2018 01/31/2018   Height 167.6 cm - 167.6 cm - - 177.8 cm 177.8 cm   Weight 66.679 kg 64 kg 64 kg 63.2 kg 62.1 kg 64.91 kg 63.7 kg   Weight Method - Actual - Standing Scale Standing Scale - Bed Scale   BMI (calculated) 23.8 kg/m2 - 22.8 kg/m2 - - 20.6 kg/m2 20.2 kg/m2         Intake/Output Summary (Last 24 hours) at 02/11/2018 1706  Last data filed at 02/11/2018 0930  Gross per 24 hour   Intake 240 ml   Output    Net 240 ml     Body mass index is 20.15 kg/m.     Meds:     Current Facility-Administered Medications   Medication Dose Route Frequency    amLODIPine  5 mg Oral QAM    apixaban  5 mg Oral Q12H [facility]    clopidogrel  75 mg Oral QAM    finasteride  5 mg Oral QAM    glimepiride  1 mg Oral QAM AC    insulin glargine  12 Units Subcutaneous QHS    insulin lispro (1 Unit Dial)  1-9 Units Subcutaneous TID AC    And    insulin lispro (1 Unit Dial)  1-7 Units Subcutaneous QHS    lactobacillus species  50 Billion CFU Oral Daily    piperacillin-tazobactam  2.25 g Intravenous Q8H    simvastatin  20 mg Oral QHS    sodium bicarbonate  650 mg Oral BID    sodium chloride (PF)  3 mL Intravenous Q8H    vitamin D  25 mcg Oral QAM       PRN Meds: acetaminophen,  dextrose, glucagon (rDNA), hydrALAZINE, NSG Communication: Glucose POCT order (AC, HS) **AND** insulin lispro (1 Unit Dial) **AND** insulin lispro (1 Unit Dial) **AND** insulin lispro (1 Unit Dial), naloxone, ondansetron.     LABS:     Estimated  Creatinine Clearance: 21.9 mL/min (A) (based on SCr of 2.58 mg/dL (H)).  Recent Labs   Lab 02/11/18  0306 02/08/18  0237   WBC 14.2* 11.3*   RBC 3.51* 3.64*   Hemoglobin 10.9* 11.1*   Hematocrit 33.2* 34.3*   MCV 95 94   PLT CT 259 194             Lab Results   Component Value Date    HGBA1CPERCNT 5.7 02/07/2018     Recent Labs   Lab 02/11/18  0306 02/10/18  0347 02/09/18  0407   Glucose 133* 105* 94   Sodium 140 142 140   Potassium 3.9 3.8 4.1   Chloride 111* 111* 110   CO2 19* 21 22   BUN 57* 64* 71*   Creatinine 2.58* 2.65* 3.22*   EGFR 27* 26* 20*   Calcium 8.2* 7.8* 8.0*     Recent Labs   Lab 02/11/18  0306   Albumin 2.2*   Protein, Total 5.4*   Bilirubin, Total 0.4   Alkaline Phosphatase 61   ALT 78*   AST (SGOT) 36     Recent Labs   Lab 02/07/18  1220   Specific Gravity, UR 1.019   pH, Urine 5.0   Protein, UR 30*   Glucose, UA Negative   Ketones UA Negative   Bilirubin, UA Negative   Blood, UA Small*   Nitrite, UA Negative   Urobilinogen, UA Normal   Leukocyte Esterase, UA Negative   WBC, UA 4   RBC, UA 2   Bacteria, UA Rare*      Patient Lines/Drains/Airways Status    Active PICC Line / CVC Line / PIV Line / Drain / Airway / Intraosseous Line / Epidural Line / ART Line / Line / Wound / Pressure Ulcer / NG/OG Tube     Name:   Placement date:   Placement time:   Site:   Days:    Peripheral IV 02/04/18 Anterior;Left Upper Arm   02/04/18    1610    Upper Arm   less than 1               Fl Modified Barium Swallow W Speech Therapy    Result Date: 02/08/2018  Flash penetration with honey thick consistency. Please see separate speech pathology report for further details. ReadingStation:[email or web address]    US Renal Kidney Bladder Complete    Result Date: 02/07/2018  1. No hydronephrosis. Echogenic kidneys which may be seen with medical renal disease. Possible 6 mm nonobstructing stone in lower pole the left kidney. 2. No abnormalities identified of the urinary bladder. Prostate enlargement. ReadingStation:[email or web address]    Xr Chest Ap  Portable    Result Date: 02/11/2018  Hypoinflated lungs with bronchovascular crowding and bibasilar atelectasis. No definite new focal consolidation to suggest pneumonia. [place] Needs:  There are no questions and answers to display.       Nutrition assessment done in collaboration with Registered Dietitians:     Time spent:      Barbaraann Faster, MD     02/11/18,5:06 PM   MRN: [id number]  CSN: [id number] DOB: October 22, 1940

## 2018-02-11 NOTE — Plan of Care (Addendum)
Patient alert, but minimally interactive.  Patient demonstrates no desire to interact.  Patient demonstrates purposeful movement with LE's.  Patient had large BM this shift.  Patient code status changed this shift.  Patient repositioned throughout shift.  Patient currently in bed with alarm on and door open for better visibility.

## 2018-02-11 NOTE — Progress Notes (Signed)
[facility] RAPID RESPONSE SEPSIS     [name] is a  78 y.o.  male admitted 02/07/2018 with   1. Fall at home, initial encounter    2. Traumatic subdural hematoma without loss of consciousness, initial encounter    3. Scalp laceration, initial encounter    4. Dementia without behavioral disturbance, unspecified dementia type    5. History of CVA (cerebrovascular accident)    6. Renal insufficiency    7. Leukocytosis, unspecified type    8. Type 2 diabetes mellitus with hyperglycemia, unspecified whether long term insulin use      The Rapid Response Team was activated  on 02/11/2018 for sepsis screening. Vital signs are:   Temperature (!) 100.9 F (38.3 C), heart rate 94, blood pressure 149/74, respirations 17, pulse ox 96%.         The Rapid Response Team was initiated to see your patient on 02/11/2018.  Based on the clinical situation, the Sepsis protocol was initiated.         Systemic Inflammatory Response Syndrome (select 2 or more to continue screening)   Hyperthermia (Temperature > 38.3C)  No   Hypothermia (Temperature < 36 C)  No   Pulse > 100   No   Respirations > 22  No   WBC > 12   or    WBC < 4    or   WBC wnl with > 10% bands  Yes      Rapid response evaluation triggered by computer generated [place] for Sepsis screening. SIRS negative at this time, sepsis panel not ordered per protocol. Staff to call with further RRT needs.    Orinda Kenner, RN  Rapid Response Team

## 2018-02-11 NOTE — ACP (Advance Care Planning) (Signed)
Advance Care Planning Services    Patient Name: [name],[name] LOS: 4 days   Attending Physician: Barbaraann Faster, MD   Primary Care Physician: Melvyn Neth, MD   Narrative of Meeting   Events prompting this discussion:  Dementia and dysphasia with aspiration pneumonia     Major disease and the trajectory:  Expected to get intermittently worse going forward     The types of care discussed:  Standard of care for disease     The Patient's/Surrogate's wishes and goals for treatment:  POA is agreeable to DNR.  We signed a [place] DNR form.  Also CODE STATUS panel updated in epic for no CPR/no support allow natural death    Active Problem List  Active Problems:    Essential hypertension    Type 2 diabetes mellitus with stage 3 chronic kidney disease, without long-term current use of insulin    Nonverbal    Fall (on) (from) other stairs and steps, initial encounter    Stroke    Subdural hematoma        Documents Filled Out As Part of Discussion:                Durable DO NOT RESUSCITATE  Existing Documents that were reviewed and discussed with the patient/surrogate:  None   Acknowledgment of patient's right to decline advance care planning:  The patient does not have medical decision-making capacity and was represented by POA in record , [name], [name]  The patient/surrogate was informed that this session is completely voluntary and was given the opportunity to decline the ACP services. The patient/surrogate decided to participate in the ACP session with the discussion proceeding as described above.   I have spent 20 minutes on face-to-face [place]   100% of the time was spent on discussion and counseling the patient and POA (niece).   No active management of the problems listed above was undertaken during the time period reported.       Medical decision-making capacity can be determined by patient's ability to have:  1.     Factual understanding of current situation and issues;    2.     Interpretation of the information presented.   3.     Reasoning ability about decisions/choices.  4.     Execution of voluntary decision without coercion.   Advance care planning:             Improves end of life care            Improves patient & family satisfaction            Reduces stress, anxiety, & depression in surviving relatives  Reference:  Detering et al., BMJ 2010; 340 doi: PlayMommy.no (Published 03 April 2008)   Barbaraann Faster, MD  02/11/18 5:15 PM  MRN: [id number]                                       CSN: [id number]                                             DOB: 05/19/1940

## 2018-02-11 NOTE — Plan of Care (Addendum)
NURSE NOTE SUMMARY  [facility]   Patient Name: [name]   Attending Physician: Barbaraann Faster, MD   Today's date:   02/11/2018 LOS: 4 days   Shift Summary:                                                              Pt resting well this shift, no BM for this shift. VSS, will continue to monitor.    Provider Notifications:      Rapid Response Notifications:  Mobility:      PMP Activity: Step 3 - Bed Mobility (02/10/2018  8:30 PM)     Weight tracking:  Family Dynamic:   No data found.          Recent Vitals Last Bowel Movement   BP: 196/74 (02/10/2018 11:38 PM)  Heart Rate: 67 (02/10/2018 11:38 PM)  Temp: 99.7 F (37.6 C) (02/10/2018 11:38 PM)  Resp Rate: 20 (02/10/2018 11:38 PM)  SpO2: 97 % (02/10/2018 11:38 PM)   Last BM Date: 02/10/18       Problem: Moderate/High Fall Risk Score >5  Goal: Patient will remain free of falls  Outcome: Progressing  Flowsheets (Taken 02/10/2018 2030)  VH High Risk (Greater than 13): ALL REQUIRED LOW INTERVENTIONS;ALL REQUIRED MODERATE INTERVENTIONS;RED "HIGH FALL RISK" SIGNAGE;BED ALARM WILL BE ACTIVATED WHEN THE PATEINT IS IN BED WITH SIGNAGE "RESET BED ALARM";A CHAIR PAD ALARM WILL BE USED WHEN PATIENT IS UP SITTING IN A CHAIR;PATIENT IS TO BE SUPERVISED FOR ALL TOILETING ACTIVITIES     Problem: Compromised Tissue integrity  Goal: Damaged tissue is healing and protected  Outcome: Progressing  Flowsheets (Taken 02/11/2018 0132)  Damaged tissue is healing and protected : Monitor/assess Braden scale every shift; Provide wound care per wound care algorithm; Reposition patient every 2 hours and as needed unless able to reposition self; Increase activity as tolerated/progressive mobility; Relieve pressure to bony prominences for patients at moderate and high risk; Avoid shearing injuries; Use bath wipes, not soap and water, for daily bathing; Keep intact skin clean and dry; Use incontinence wipes for cleaning urine, stool and caustic drainage. Foley  care as needed; Monitor external devices/tubes for correct placement to prevent pressure, friction and shearing; Encourage use of lotion/moisturizer on skin; Monitor patient's hygiene practices; Utilize specialty bed     Problem: Neurological Deficit  Goal: Neurological status is stable or improving  Outcome: Progressing  Flowsheets (Taken 02/09/2018 0230)  Neurological status is stable or improving: Monitor/assess/document neurological assessment (Stroke: every 4 hours);Observe for seizure activity and initiate seizure precautions if indicated     Problem: Potential for Aspiration  Goal: Risk of aspiration will be minimized  Outcome: Progressing  Flowsheets (Taken 02/11/2018 0132)  Risk of aspiration will be minimized : Monitor/assess for signs of aspiration (tachypnea, cough, wheezing, clearing throat, hoarseness after eating, decrease in SaO2); Order modified texture diet as recommended by Speech Pathologist

## 2018-02-11 DEATH — deceased

## 2018-02-12 ENCOUNTER — Inpatient Hospital Stay: Payer: Medicare Other

## 2018-02-12 LAB — VH DEXTROSE STICK GLUCOSE
Glucose POCT: 90 mg/dL (ref 71–99)
Glucose POCT: 92 mg/dL (ref 71–99)
Glucose POCT: 215 mg/dL — ABNORMAL HIGH (ref 71–99)
Glucose POCT: 124 mg/dL — ABNORMAL HIGH (ref 71–99)
Glucose POCT: 292 mg/dL — ABNORMAL HIGH (ref 71–99)

## 2018-02-12 LAB — CBC AND DIFFERENTIAL
Basophils %: 0.4 % (ref 0.0–3.0)
Basophils Absolute: 0.1 10*3/uL (ref 0.0–0.3)
Eosinophils %: 0.4 % (ref 0.0–7.0)
Eosinophils Absolute: 0.1 10*3/uL (ref 0.0–0.8)
Hematocrit: 33 % — ABNORMAL LOW (ref 39.0–52.5)
Hemoglobin: 10.5 gm/dL — ABNORMAL LOW (ref 13.0–17.5)
Lymphocytes Absolute: 1.7 10*3/uL (ref 0.6–5.1)
Lymphocytes: 9.7 % — ABNORMAL LOW (ref 15.0–46.0)
MCH: 30 pg (ref 28–35)
MCHC: 32 gm/dL (ref 32–36)
MCV: 96 fL (ref 80–100)
MPV: 8.4 fL (ref 6.0–10.0)
Monocytes Absolute: 1.7 10*3/uL (ref 0.1–1.7)
Monocytes: 9.9 % (ref 3.0–15.0)
Neutrophils %: 79.7 % — ABNORMAL HIGH (ref 42.0–78.0)
Neutrophils Absolute: 13.9 10*3/uL — ABNORMAL HIGH (ref 1.7–8.6)
PLT CT: 285 10*3/uL (ref 130–440)
RBC: 3.46 10*6/uL — ABNORMAL LOW (ref 4.00–5.70)
RDW: 12 % (ref 11.0–14.0)
WBC: 17.4 10*3/uL — ABNORMAL HIGH (ref 4.0–11.0)

## 2018-02-12 LAB — COMPREHENSIVE METABOLIC PANEL
ALT: 66 U/L — ABNORMAL HIGH (ref 0–55)
AST (SGOT): 41 U/L (ref 10–42)
Albumin/Globulin Ratio: 0.64 Ratio — ABNORMAL LOW (ref 0.80–2.00)
Albumin: 2.1 gm/dL — ABNORMAL LOW (ref 3.5–5.0)
Alkaline Phosphatase: 59 U/L (ref 40–145)
Anion Gap: 15.9 mMol/L (ref 7.0–18.0)
BUN / Creatinine Ratio: 21.9 Ratio (ref 10.0–30.0)
BUN: 57 mg/dL — ABNORMAL HIGH (ref 7–22)
Bilirubin, Total: 0.4 mg/dL (ref 0.1–1.2)
CO2: 20 mMol/L (ref 20–30)
Calcium: 8.2 mg/dL — ABNORMAL LOW (ref 8.5–10.5)
Chloride: 110 mMol/L (ref 98–110)
Creatinine: 2.6 mg/dL — ABNORMAL HIGH (ref 0.80–1.30)
EGFR: 26 mL/min/{1.73_m2} — ABNORMAL LOW (ref 60–150)
Globulin: 3.3 gm/dL (ref 2.0–4.0)
Glucose: 87 mg/dL (ref 71–99)
Osmolality Calculated: 298 mOsm/kg (ref 275–300)
Potassium: 3.9 mMol/L (ref 3.5–5.3)
Protein, Total: 5.4 gm/dL — ABNORMAL LOW (ref 6.0–8.3)
Sodium: 142 mMol/L (ref 136–147)

## 2018-02-12 LAB — VH C. DIFFICILE TOXIN B GENE BY DNA AMPLIFICATION: Stool Clostridium difficile Toxin B Gene DNA Amplification: NEGATIVE

## 2018-02-12 MED ORDER — FUROSEMIDE 10 MG/ML IJ SOLN
40.00 mg | Freq: Once | INTRAMUSCULAR | Status: AC
Start: 2018-02-12 — End: 2018-02-12
  Administered 2018-02-12: 11:00:00 40 mg via INTRAVENOUS
  Filled 2018-02-12 (×2): qty 4

## 2018-02-12 MED ORDER — HYDRALAZINE HCL 10 MG PO TABS
10.0000 mg | ORAL_TABLET | Freq: Three times a day (TID) | ORAL | Status: DC
Start: 2018-02-12 — End: 2018-02-14
  Administered 2018-02-12 – 2018-02-14 (×7): 10 mg via ORAL
  Filled 2018-02-12 (×10): qty 1

## 2018-02-12 MED ORDER — VANCOMYCIN HCL IN DEXTROSE 1-5 GM/200ML-% IV SOLN
1000.00 mg | Freq: Once | INTRAVENOUS | Status: AC
Start: 2018-02-12 — End: 2018-02-12
  Administered 2018-02-12: 09:00:00 1000 mg via INTRAVENOUS
  Filled 2018-02-12: qty 1000

## 2018-02-12 MED ORDER — VH VANCOMYCIN THERAPY PLACEHOLDER
Status: DC
Start: 2018-02-12 — End: 2018-02-15

## 2018-02-12 MED ORDER — AMLODIPINE BESYLATE 5 MG PO TABS
10.0000 mg | ORAL_TABLET | Freq: Every morning | ORAL | Status: DC
Start: 2018-02-12 — End: 2018-02-21
  Administered 2018-02-12 – 2018-02-21 (×10): 10 mg via ORAL
  Filled 2018-02-12 (×11): qty 2

## 2018-02-12 MED ORDER — ATENOLOL 25 MG PO TABS
25.0000 mg | ORAL_TABLET | Freq: Every day | ORAL | Status: DC
Start: 2018-02-12 — End: 2018-02-12

## 2018-02-12 NOTE — Progress Notes (Signed)
Medicine Progress Note   [facility]   Patient Name: [name], [name] LOS: 5 days   Attending Physician: Barbaraann Faster, MD PCP: Melvyn Neth, MD      Hospital Course:                                                            [name] is a 78 y.o. male Ms. past medical history old stroke, CAD,diabetes mellitus, hypertension,stage 4 CKD and dementia who came in for extended for and skin laceration.  Patient was recently dced on plavix and eliquis . Eliquis was added on last admit bc of PE and DVT. Patient has dementia patient cannot provide any medical information history obtained from the ER physician and the chart.  Patient was found to have possible tiny subdural hematoma.  Seen by neurosurgical service, recommend observation overnight and repeat a CT scan of the head if there is no hematoma patient can be discharged.  initially held  Plavix/eliquis.  The scalp laceration been sutured. Repeat CT showed no evidence of bleed. Meds resumed. Placement pending as family states they cannot take care of him at home.    Has persistent leukocytosis likely from aspiration PNA.  Cr range seems WNL as per his baseline ckd 4.       Assessment and Plan:      Right sided Aspiration PNA on CT--POA  --> had MBSS-- has op mod dysphagia --diet modified  --  MRSA nares negative  -- Influenza A n B -- neg     - cont zosynday 8  -- >> WBC started going up again with some fever.  I did dry CT today --Development of bilateral moderate-sized pleural effusions. Associated lower lobe atelectasis and probable consolidation/pneumonia on the right. High density material within these regions,, suggesting aspiration of oral contrast.  I still feel still aspirating intermittently   Give a dose of IV Lasix today .Marland Kitchen IVF dced yesterday   Add iv vancomycing   Check a UA     Mod OP dysphagia -- likely from demential on hx of CVA.Marland Kitchen diet adjusted per SLP ( had MBSS)    Demential liklely  senile and vascular -- chronic       Possible tiny acute subdural hematoma on the left, without mass effect  Due to accidental fall  No neuro deficit  Neurosurgery consulted --recommend repeat a CT scan if CT scan no increase of hematoma patient can be discharged  Control blood pressure  --> normal repeat CT, plavix and eliquis resumed on 1/26th     Scalp laceration due to accidental fall  S/P suture  CT of the neck neck did not show to fracture or dislocation      Esophageal foreign body possible food or mass ??  This is detected on the CT of the neck   CT of chest without contrast  --> no such esophageal finding       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   Plavix resumed , continue statin    Stage 4 CKD  Creatinine uptrending despite fluids  Renal ultrasound showed medical renal disease  Cont monitor consider renal consult    ----> my impression is he is still in his baseline range..(  no AKI)      Diarrhea -- possibly abx associated --  c diff study didn't turn out and dced       Hypertension needs better control  Amlodipine 5 mg daily.. cant use ace bc of ckd   IV hydralazine for blood pressure greater than 160  s/p IVF      Diabetesmellitus  Cont oral meds  Cont sliding scale insulin  basal insulin         Single-vessel CAD --stable --continue plavix and  statin    Intermittent brady --no BB    Acute Bilateral PE diagnosed Dec 2019  No new acute pe this admit  Eliquisresumed       DVT PPx: Eliquis      Poor long term prognosis-- supported with CT scan finding today 02/12/18 and elevated wbc .  I may involve palliative care tomorrow     Dispo:     Looking into LTC    Healthcare Proxy: Family    Code:  DNR ( made with poa 2/1)     Subjective     Patient is demented  Cannot have a good communication  NAD otherwise       Objective   Physical Exam:     Vitals: T:100 F (37.8 C) (Oral), BP:157/64, HR:(!) 118, RR:16, SaO2:96%    General: Patient is  awake. In no acute distress.  HEENT: No conjunctival drainage, vision is intact, anicteric sclera.  Neck: Supple, no thyromegaly.  Chest: mild rhonchi b/l  . dminished at bases .Marland Kitchen no wheezing. No use of accessory muscles.  CVS: Normal rate and regular rhythm no murmurs, without JVD, no pitting edema, pulses palpable.  Abdomen: Soft, non-tender, no guarding or rigidity, with normal bowel sounds.  Extremities: No calf swelling and no gross deformity.  Skin: Warm, dry, no rash and no worrisome lesions.  NEURO: No motor or sensory deficits.  Psychiatric: Alert, less interactive,   Weight Monitoring 11/28/2017 12/29/2017 12/29/2017 12/31/2017 01/01/2018 01/17/2018 01/18/2018   Height 167.6 cm - 167.6 cm - - 177.8 cm 177.8 cm   Weight 66.679 kg 64 kg 64 kg 63.2 kg 62.1 kg 64.91 kg 63.7 kg   Weight Method - Actual - Standing Scale Standing Scale - Bed Scale   BMI (calculated) 23.8 kg/m2 - 22.8 kg/m2 - - 20.6 kg/m2 20.2 kg/m2       No intake or output data in the 24 hours ending 02/12/18 1328  Body mass index is 20.15 kg/m.     Meds:     Current Facility-Administered Medications   Medication Dose Route Frequency    amLODIPine  10 mg Oral QAM    apixaban  5 mg Oral Q12H [facility]    clopidogrel  75 mg Oral QAM    finasteride  5 mg Oral QAM    hydrALAZINE  10 mg Oral Q8H [place]    insulin glargine  12 Units Subcutaneous QHS    insulin lispro (1 Unit Dial)  1-9 Units Subcutaneous TID AC    And    insulin lispro (1 Unit Dial)  1-7 Units Subcutaneous QHS    lactobacillus species  50 Billion CFU Oral Daily    piperacillin-tazobactam  2.25 g Intravenous Q8H    simvastatin  20 mg Oral QHS    sodium bicarbonate  650 mg Oral BID    sodium chloride (PF)  3 mL Intravenous Q8H    vancomycin therapy placeholder   Does not apply See Admin Instructions  vitamin D  25 mcg Oral QAM       PRN Meds: acetaminophen, dextrose, glucagon (rDNA), hydrALAZINE, NSG Communication: Glucose POCT order (AC, HS) **AND** insulin lispro (1 Unit Dial)  **AND** insulin lispro (1 Unit Dial) **AND** insulin lispro (1 Unit Dial), naloxone, ondansetron.     LABS:     Estimated Creatinine Clearance: 21.4 mL/min (A) (based on SCr of 2.6 mg/dL (H)).  Recent Labs   Lab 02/12/18  0336 02/11/18  0306   WBC 17.4* 14.2*   RBC 3.46* 3.51*   Hemoglobin 10.5* 10.9*   Hematocrit 33.0* 33.2*   MCV 96 95   PLT CT 285 259             Lab Results   Component Value Date    HGBA1CPERCNT 5.7 02/08/2018     Recent Labs   Lab 02/12/18  0336 02/11/18  0306 02/10/18  0347   Glucose 87 133* 105*   Sodium 142 140 142   Potassium 3.9 3.9 3.8   Chloride 110 111* 111*   CO2 20 19* 21   BUN 57* 57* 64*   Creatinine 2.60* 2.58* 2.65*   EGFR 26* 27* 26*   Calcium 8.2* 8.2* 7.8*     Recent Labs   Lab 02/12/18  0336 02/11/18  0306   Albumin 2.1* 2.2*   Protein, Total 5.4* 5.4*   Bilirubin, Total 0.4 0.4   Alkaline Phosphatase 59 61   ALT 66* 78*   AST (SGOT) 41 36     Recent Labs   Lab 02/07/18  1220   Specific Gravity, UR 1.019   pH, Urine 5.0   Protein, UR 30*   Glucose, UA Negative   Ketones UA Negative   Bilirubin, UA Negative   Blood, UA Small*   Nitrite, UA Negative   Urobilinogen, UA Normal   Leukocyte Esterase, UA Negative   WBC, UA 4   RBC, UA 2   Bacteria, UA Rare*      Patient Lines/Drains/Airways Status    Active PICC Line / CVC Line / PIV Line / Drain / Airway / Intraosseous Line / Epidural Line / ART Line / Line / Wound / Pressure Ulcer / NG/OG Tube     Name:   Placement date:   Placement time:   Site:   Days:    Peripheral IV 02/04/18 Anterior;Left Upper Arm   02/04/18    1610    Upper Arm   less than 1               Ct Chest Wo Contrast    Result Date: 02/12/2018  Development of bilateral moderate-sized pleural effusions. Associated lower lobe atelectasis and probable consolidation/pneumonia on the right. High density material within these regions,, suggesting aspiration of oral contrast. No distention or gross abnormality identified to the esophagus. ReadingStation:[email or web address]    Fl  Modified Barium Swallow W Speech Therapy    Result Date: 02/08/2018  Flash penetration with honey thick consistency. Please see separate speech pathology report for further details. ReadingStation:[email or web address]    US Renal Kidney Bladder Complete    Result Date: 02/07/2018  1. No hydronephrosis. Echogenic kidneys which may be seen with medical renal disease. Possible 6 mm nonobstructing stone in lower pole the left kidney. 2. No abnormalities identified of the urinary bladder. Prostate enlargement. ReadingStation:[email or web address]    Xr Chest Ap Portable    Result Date: 02/11/2018  Hypoinflated lungs with bronchovascular crowding and bibasilar atelectasis. No definite new focal consolidation to  suggest pneumonia. ReadingStation:[email or web address]          Barbaraann Faster, MD     02/12/18,1:28 PM   MRN: [id number]                                      CSN: [id number] DOB: Feb 02, 1940

## 2018-02-12 NOTE — Plan of Care (Addendum)
Patient alert, with little interaction.  Patient has good appetite and eat well.  Patient repositioned throughout shift.  Patient had BM this shift.  Patient in bed with alarm on and call bell within reach.

## 2018-02-12 NOTE — Plan of Care (Addendum)
NURSE NOTE SUMMARY  [facility] - [facility]   Patient Name: [name]   Attending Physician: Barbaraann Faster, MD   Today's date:   02/12/2018 LOS: 5 days   Shift Summary:                                                              Pt resting well, flagged for sepsis, RR T made aware, no recommendations at this time. Will continue to monitor.    Provider Notifications:      Rapid Response Notifications:  Mobility:      PMP Activity: Step 3 - Bed Mobility (02/11/2018  7:27 PM)     Weight tracking:  Family Dynamic:   No data found.          Recent Vitals Last Bowel Movement   BP: 165/71 (02/12/2018  3:51 AM)  Heart Rate: (!) 103 (02/12/2018  4:28 AM)  Temp: (!) 100.9 F (38.3 C) (02/12/2018  3:51 AM)  Resp Rate: 16 (02/12/2018  3:51 AM)  SpO2: 99 % (02/12/2018  3:51 AM)   Last BM Date: 02/11/18       Problem: Moderate/High Fall Risk Score >5  Goal: Patient will remain free of falls  Outcome: Progressing  Flowsheets (Taken 02/11/2018 1927)  VH High Risk (Greater than 13): ALL REQUIRED LOW INTERVENTIONS;ALL REQUIRED MODERATE INTERVENTIONS;RED "HIGH FALL RISK" SIGNAGE;BED ALARM WILL BE ACTIVATED WHEN THE PATEINT IS IN BED WITH SIGNAGE "RESET BED ALARM";A CHAIR PAD ALARM WILL BE USED WHEN PATIENT IS UP SITTING IN A CHAIR;PATIENT IS TO BE SUPERVISED FOR ALL TOILETING ACTIVITIES;Use chair-pad alarm device     Problem: Neurological Deficit  Goal: Neurological status is stable or improving  Outcome: Progressing  Flowsheets (Taken 02/12/2018 0245)  Neurological status is stable or improving: Monitor/assess/document neurological assessment (Stroke: every 4 hours); Observe for seizure activity and initiate seizure precautions if indicated     Problem: Potential for Aspiration  Goal: Risk of aspiration will be minimized  Outcome: Progressing  Flowsheets (Taken 02/12/2018 0245)  Risk of aspiration will be minimized : Monitor/assess for signs of aspiration (tachypnea, cough, wheezing, clearing throat, hoarseness  after eating, decrease in SaO2); Order modified texture diet as recommended by Speech Pathologist; Place patient up in chair to eat, if possible; Head of bed up 90 degrees to eat if unable to be out of bed

## 2018-02-12 NOTE — Progress Notes (Signed)
Pharmacy Vancomycin Dosing Consult Note  [name]    Assessment:   1. Day #1 Vancomycin in a 78yoM for aspiration PNA. PMH significant CKD stage 4.   2. Patient remains febrile with worsening leukocytosis. Nares swab from 02/05/18 negative. No other cultures pending.     Plan:   1. Vancomycin 1000mg  ONCE  2. Dose by levels  3. Random level TBD  4. MRSA nares negative  5. Pharmacy will follow the patient's renal function, vancomycin levels, and dosing during the course of therapy. If you have any questions, please contact the pharmacist at [phone].      Indication: PNA  Goal trough:     Age: 78 y.o.  Height: 1.778 m (5\' 10" )  Weight:  63.7 kg (140 lb 6.9 oz)  IBW: 73 kg  DW: 63.7 kg  HD/PD:      Baseline Population Estimate Kinetics:    SCr: 2.6 mg/dL    CrCl: 31.43 ml/min    Ke: 0.022hr-1        t50: 31.5 hrs        Vd: 44.59L        Expected Trough:  mg/L                  Historic Patient Regimen   Date Regimen Weight SCr CrCl Trough Level Adjustments to Dose              Current Patient Regimen   Date Regimen Trough Date/Time Trough Level Pt Specific Ke   2/2 1000mg  x1                             Cultures   Date Source Organism Sensitivities Resistance                                        qSOFA   Date SBP less than/equal to 100 mm Hg RR greater than/equal to 22 breaths/min GCS less than/equal to 14 Total            Recent Labs   Lab 02/12/18  0336 02/11/18  0306 02/10/18  0347 02/09/18  0407 02/08/18  0237 02/07/18  0253   Creatinine 2.60* 2.58* 2.65* 3.22* 3.46* 3.17*   BUN 57* 57* 64* 71* 70* 53*   WBC 17.4* 14.2*  --   --  11.3* 17.4*     Temp (24hrs), Avg:100.7 F (38.2 C), Min:99.3 F (37.4 C), Max:101.3 F (38.5 C)    Vitals:    02/12/18 0428   BP:    Pulse: (!) 103   Resp:    Temp:    SpO2:

## 2018-02-13 LAB — CBC AND DIFFERENTIAL
Basophils %: 0.2 % (ref 0.0–3.0)
Basophils Absolute: 0.1 10*3/uL (ref 0.0–0.3)
Eosinophils %: 0.2 % (ref 0.0–7.0)
Eosinophils Absolute: 0.1 10*3/uL (ref 0.0–0.8)
Hematocrit: 36.8 % — ABNORMAL LOW (ref 39.0–52.5)
Hemoglobin: 11.7 gm/dL — ABNORMAL LOW (ref 13.0–17.5)
Lymphocytes Absolute: 1.4 10*3/uL (ref 0.6–5.1)
Lymphocytes: 6 % — ABNORMAL LOW (ref 15.0–46.0)
MCH: 31 pg (ref 28–35)
MCHC: 32 gm/dL (ref 32–36)
MCV: 97 fL (ref 80–100)
MPV: 9.4 fL (ref 6.0–10.0)
Monocytes Absolute: 2.1 10*3/uL — ABNORMAL HIGH (ref 0.1–1.7)
Monocytes: 8.8 % (ref 3.0–15.0)
Neutrophils %: 84.7 % — ABNORMAL HIGH (ref 42.0–78.0)
Neutrophils Absolute: 19.8 10*3/uL — ABNORMAL HIGH (ref 1.7–8.6)
PLT CT: 258 10*3/uL (ref 130–440)
RBC: 3.79 10*6/uL — ABNORMAL LOW (ref 4.00–5.70)
RDW: 12.2 % (ref 11.0–14.0)
WBC: 23.4 10*3/uL — ABNORMAL HIGH (ref 4.0–11.0)

## 2018-02-13 LAB — VH INFLUENZA A/B RAPID TEST
Influenza A: NEGATIVE
Influenza B: NEGATIVE

## 2018-02-13 LAB — VH DEXTROSE STICK GLUCOSE
Glucose POCT: 211 mg/dL — ABNORMAL HIGH (ref 71–99)
Glucose POCT: 215 mg/dL — ABNORMAL HIGH (ref 71–99)
Glucose POCT: 159 mg/dL — ABNORMAL HIGH (ref 71–99)
Glucose POCT: 109 mg/dL — ABNORMAL HIGH (ref 71–99)
Glucose POCT: 122 mg/dL — ABNORMAL HIGH (ref 71–99)

## 2018-02-13 LAB — BASIC METABOLIC PANEL
Anion Gap: 16.8 mMol/L (ref 7.0–18.0)
BUN / Creatinine Ratio: 22 Ratio (ref 10.0–30.0)
BUN: 68 mg/dL — ABNORMAL HIGH (ref 7–22)
CO2: 17 mMol/L — ABNORMAL LOW (ref 20–30)
Calcium: 8.6 mg/dL (ref 8.5–10.5)
Chloride: 111 mMol/L — ABNORMAL HIGH (ref 98–110)
Creatinine: 3.09 mg/dL — ABNORMAL HIGH (ref 0.80–1.30)
EGFR: 21 mL/min/{1.73_m2} — ABNORMAL LOW (ref 60–150)
Glucose: 202 mg/dL — ABNORMAL HIGH (ref 71–99)
Osmolality Calculated: 305 mOsm/kg — ABNORMAL HIGH (ref 275–300)
Potassium: 4.8 mMol/L (ref 3.5–5.3)
Sodium: 140 mMol/L (ref 136–147)

## 2018-02-13 MED ORDER — VALLEY PROMETHAZINE 50 MG/0.4 ML TOPICAL GEL UD (RPKG)
25.00 mg | Freq: Four times a day (QID) | TOPICAL | Status: DC | PRN
Start: 2018-02-13 — End: 2018-02-21
  Administered 2018-02-13: 02:00:00 25 mg via TOPICAL
  Filled 2018-02-13: qty 0.2

## 2018-02-13 MED ORDER — SODIUM CHLORIDE 0.9 % IV MBP
500.00 mg | Freq: Once | INTRAVENOUS | Status: AC
Start: 2018-02-13 — End: 2018-02-13
  Administered 2018-02-13: 21:00:00 500 mg via INTRAVENOUS
  Filled 2018-02-13: qty 500

## 2018-02-13 MED ORDER — VH LOPERAMIDE HCL 2 MG/15 ML ORAL SOLUTION
2.00 mg | Freq: Four times a day (QID) | ORAL | Status: DC | PRN
Start: 2018-02-13 — End: 2018-02-25
  Administered 2018-02-13: 10:00:00 2 mg via ORAL
  Filled 2018-02-13: qty 15

## 2018-02-13 MED ORDER — HYDROMORPHONE HCL 2 MG PO TABS
1.0000 mg | ORAL_TABLET | ORAL | Status: DC | PRN
Start: 2018-02-13 — End: 2018-02-21
  Administered 2018-02-16 – 2018-02-18 (×8): 1 mg via ORAL
  Filled 2018-02-13 (×8): qty 1

## 2018-02-13 MED ORDER — ACETAMINOPHEN 500 MG PO TABS
500.0000 mg | ORAL_TABLET | Freq: Three times a day (TID) | ORAL | Status: DC
Start: 2018-02-13 — End: 2018-02-14
  Administered 2018-02-13 – 2018-02-14 (×2): 500 mg via ORAL
  Filled 2018-02-13 (×6): qty 1

## 2018-02-13 NOTE — Plan of Care (Addendum)
NURSE NOTE SUMMARY  [facility] [place]   Patient Name: [name]   Attending Physician: Barbaraann Faster, MD   Today's date:   02/13/2018 LOS: 6 days   Shift Summary:                                                              Pt had multiple foul smelling, yellow/ tan colored BM this shift and multiply episodes of brown emesis. C.diff and Flu samples taken both negative. Pt appears to be having pain in the knee, medicated with prn meds. Pt continues to have increased temp. Repositioned this shift. Will continue to monitor.        Provider Notifications:      Rapid Response Notifications:  Mobility:      PMP Activity: Step 3 - Bed Mobility (02/12/2018  7:35 PM)     Weight tracking:  Family Dynamic:   No data found.          Recent Vitals Last Bowel Movement   BP: 127/70 (02/13/2018  5:10 AM)  Heart Rate: (!) 117 (02/13/2018  3:13 AM)  Temp: (!) 100.8 F (38.2 C) (02/13/2018  3:13 AM)  Resp Rate: 16 (02/13/2018  3:13 AM)  SpO2: 91 % (02/13/2018  3:13 AM)   Last BM Date: 02/11/18       Problem: Moderate/High Fall Risk Score >5  Goal: Patient will remain free of falls  Outcome: Progressing  Flowsheets (Taken 02/11/2018 1927)  VH High Risk (Greater than 13): ALL REQUIRED LOW INTERVENTIONS;ALL REQUIRED MODERATE INTERVENTIONS;RED "HIGH FALL RISK" SIGNAGE;BED ALARM WILL BE ACTIVATED WHEN THE PATEINT IS IN BED WITH SIGNAGE "RESET BED ALARM";A CHAIR PAD ALARM WILL BE USED WHEN PATIENT IS UP SITTING IN A CHAIR;PATIENT IS TO BE SUPERVISED FOR ALL TOILETING ACTIVITIES;Use chair-pad alarm device     Problem: Compromised Tissue integrity  Goal: Damaged tissue is healing and protected  Outcome: Progressing  Flowsheets (Taken 02/11/2018 0132)  Damaged tissue is healing and protected : Monitor/assess Braden scale every shift;Provide wound care per wound care algorithm;Reposition patient every 2 hours and as needed unless able to reposition self;Increase activity as tolerated/progressive mobility;Relieve  pressure to bony prominences for patients at moderate and high risk;Avoid shearing injuries;Use bath wipes, not soap and water, for daily bathing;Keep intact skin clean and dry;Use incontinence wipes for cleaning urine, stool and caustic drainage. Foley care as needed;Monitor external devices/tubes for correct placement to prevent pressure, friction and shearing;Encourage use of lotion/moisturizer on skin;Monitor patient's hygiene practices;Utilize specialty bed     Problem: Potential for Aspiration  Goal: Risk of aspiration will be minimized  Outcome: Progressing  Flowsheets (Taken 02/12/2018 0245)  Risk of aspiration will be minimized : Monitor/assess for signs of aspiration (tachypnea, cough, wheezing, clearing throat, hoarseness after eating, decrease in SaO2);Order modified texture diet as recommended by Speech Pathologist;Place patient up in chair to eat, if possible;Head of bed up 90 degrees to eat if unable to be out of bed     Problem: Impaired Mobility  Goal: Mobility/Activity is maintained at optimal level for patient  Outcome: Progressing  Flowsheets (Taken 02/09/2018 0230)  Mobility/activity is maintained at optimal level for patient: Increase mobility as tolerated/progressive mobility;Perform active/passive ROM;Encourage independent activity per ability;Maintain proper body alignment;Plan activities to conserve energy,  plan rest periods;Reposition patient every 2 hours and as needed unless able to reposition self;Assess for changes in respiratory status, level of consciousness and/or development of fatigue;Consult/collaborate with Physical Therapy and/or Occupational Therapy

## 2018-02-13 NOTE — Consults (Signed)
[name]  Initial Comprehensive Assessment  Date, Time: 02/13/18 11:43 AM  Patient Name: [name]  Referring Physician:  Barbaraann Faster, MD   Primary Care Physician: [name] Clarita Crane, MD  Consulting Team: Brandon Hollingshead, MD; Brandon Schilder, MD; Brandon Ditch, PA; [name], [name]  Consulting Service: Palliative Medicine  Reason for Consult: goals of care, clarification of prognosis and hospice information and/or referral related to dementia  Palliative care is a specialized, interdisciplinary approach to improving comfort and quality of life at any stage of a serious illness by addressing symptoms, communication, and transitions.        Assessment & Recommendations     Impression / Assessment:    Metabolic encephalopathy  Aspiration pneumonia  Advanced vascular dementia  Acute kidney injury on chronic kidney disease    Recommendations / Plan:  Pain:   Scheduled Tylenol 500 mg 3 times daily  Add Dilaudid 1 mg p.o. every 3 hours as needed pain    Non-pain Symptoms:     Diarrhea:  Need to address IV antibx as it is likely the cause of the diarrhea    Other medication recommendations:   Recommend deprescribing the following medications:  meds that lack efficacy at the end-of-life:  Zocor, Proscar  meds whose risk(s) outweigh benefits:  eliquis  meds that add to pill burden:  Vitamin D    Nutrition: as tolerated  Consultants: none  Goals of care: The patient/family wants highest priority given to comfort, as opposed to independence (function) or longevity.  Advance Directives:   Current CPR Status:  NO CPR  -  ALLOW NATURAL DEATH        Needs/wants further discussion of advance directives/code status:  []  Yes [x]  No  Disposition / Further attention / Follow-up:     Patient's niece and medical power of attorney [name] will come in tomorrow at 78 AM for face-to-face meeting.                          Discussed with: Dr [name], CM Brandon Sleight, RN  [name]    Thank you, Dr. Bennetta Hoffman, for this consult. Please do not hesitate to contact the Palliative Care Service if you have questions about the above recommendations.     Brandon Schilder, MD    Palliative Care Service, [facility], [address], [place], [place] [address]       History of Present Illness     CC: Altered mental status    This is a 78 y.o. male past medical history of previous stroke with right-sided weakness, coronary artery disease, diabetes mellitus, hypertension, stage IV chronic kidney disease admitted 02/08/2018 after a fall at home.  Apparently the patient lives with his niece at home and normally ambulates with a cane and was found to be down on the wooden steps at home having struck his head.  Patient was  evaluated in the ER , found to have a laceration and initially suspected of having subdural hematoma which was not evident on repeat scans.  After placement on observation, his neice reported that she was no longer able to take care of him at home and already seeking placement through her family physician outpatient. The patient uncerwent PT/OT evaluation and apparently plans were made for the patient to go to long-term care.  Patient underwent a swallow evaluation and was cleared for pured diet with honey thick liquids per speech eval recommendations.  However once the patient was allowed to begin oral nutrition, he developed fever with increasing leukocytosis with likely aspiration pneumonia and was started on IV antibiotics.  Then he developed significant diarrhea as well as vomiting and became less responsive.  At this point he has developed persistent non-resolving pneumonia on CT imaging along with persistent fever, leukocytosis along with worsening renal failure.  Apparently discussions were held by primary attending with patient's medical power of attorney and patient now a DNR.     The Palliative Care Service was consulted to assist with further discussion of goals of  care as well as transitions.     The patient is difficult to arouse and does not follow any commands. I was able to reach the patient's medical power of attorney Ms. [name] by phone.  She is unable to come to the hospital today for further discussion on goals of care because of personal health problem.  We agreed to meet tomorrow (Tuesday) at 10 AM for a formal goals of care meeting and to discuss transitions.     ADLs prior to admission:    Independent Needs assistance Dependent   Ambulation []   []  [x]    Transferring []  []  [x]    Dressing []  [x]  []    Bathing []  [x]  []    Toileting []  [x]  []    Feeding []  [x]  []      Advance Care Planning:  Advance Directives:    Has:           [x]  Yes []  No       If yes, type of document: Living will/DDNR  Discussed: []  Yes [x]  No       Health-care power of attorney: [name] (niece)    Review of Systems     Review of Systems   Unable to perform ROS: Mental acuity      Past Medical and Surgical History     Past Medical History:   Diagnosis Date    Cataracts, bilateral     Diabetes mellitus     Hypertension     Kidney disorder     stage IV kidney disease    Stroke      Past Surgical History:   Procedure Laterality Date    EYE SURGERY      LUNG LOBECTOMY      right eye transplant      unsuccessful     Social History     Social History     Tobacco Use   Smoking Status Never Smoker   Smokeless Tobacco Never Used     Social History     Substance and Sexual Activity   Alcohol Use Never    Frequency: Never     Social History     Substance and Sexual Activity   Drug Use Never     Social History     Social History Narrative    Not on file      Marital Status: single  Lives with: neice  Family: niece and her husband  Occupation: not known  Education level: not known  Spirituality (faith & importance, community, assessment/issues): not explored  What gives meaning to the patient? Not explored     Emergency contact listed: Extended Emergency Contact Information  Primary  Emergency Contact: Hoffman, Brandon Nian  Address: [address]           [place],  [address] Johnnette Litter of [place] Phone: [phone]  Relation: Relative  Secondary Emergency Contact: Hoffman, [employer] Phone:  [phone]  Relation: Brother in law                                      Family History     Family History   Problem Relation Age of Onset    Heart attack Mother     Heart attack Father     Heart attack Sister     Cancer Brother     Heart attack Maternal Aunt     Heart attack Maternal Uncle     No known problems Paternal Aunt     No known problems Paternal Uncle     No known problems Maternal Grandmother     No known problems Maternal Grandfather     No known problems Paternal Grandmother     No known problems Paternal Grandfather      Medications     Medications:       Current Facility-Administered Medications   Medication Dose Route Frequency    amLODIPine  10 mg Oral QAM    apixaban  5 mg Oral Q12H [facility]    clopidogrel  75 mg Oral QAM    finasteride  5 mg Oral QAM    hydrALAZINE  10 mg Oral Q8H [facility]    insulin glargine  12 Units Subcutaneous QHS    insulin lispro (1 Unit Dial)  1-9 Units Subcutaneous TID AC    And    insulin lispro (1 Unit Dial)  1-7 Units Subcutaneous QHS    lactobacillus species  50 Billion CFU Oral Daily    piperacillin-tazobactam  2.25 g Intravenous Q8H    simvastatin  20 mg Oral QHS    sodium bicarbonate  650 mg Oral BID    sodium chloride (PF)  3 mL Intravenous Q8H    vancomycin therapy placeholder   Does not apply See Admin Instructions    vitamin D  25 mcg Oral QAM     acetaminophen, dextrose, glucagon (rDNA), hydrALAZINE, NSG Communication: Glucose POCT order (AC, HS) **AND** insulin lispro (1 Unit Dial) **AND** insulin lispro (1 Unit Dial) **AND** insulin lispro (1 Unit Dial), loperamide, naloxone, ondansetron, promethazine    Allergies     No Known Allergies    Physical Exam     BP 117/61    Pulse (!) 106    Temp (!) 100.8 F (38.2 C)  (Rectal)    Resp 17    Ht 1.778 m (5\' 10" ) Comment: 5 ft 10 in   Wt 63.7 kg (140 lb 6.9 oz)    SpO2 99%    BMI 20.15 kg/m     Physical Exam   Constitutional: No distress.   Resting comfortably in bed but not responding to name calling   HENT:   Mouth/Throat: No oropharyngeal exudate.   Eyes: Right eye exhibits no discharge. Left eye exhibits no discharge. No scleral icterus.   Neck: No thyromegaly present.   Cardiovascular: Normal rate, regular rhythm and normal heart sounds.   Pulmonary/Chest: Breath sounds normal. No respiratory distress. He has no wheezes. He has no rales.   Abdominal: Soft. Bowel sounds are normal.   Musculoskeletal:         General: Edema present.   Lymphadenopathy:     He has no cervical adenopathy.   Neurological:   Extremely lethargic; occasionally opens eyes to loud name calling and tactile stimuli;  Does not follow any commands  Right upper extremity  contractures   Skin: No erythema.   Vitals reviewed.     PPS: 30%    Labs / Radiology     Lab and diagnostics: reviewed by me.  Recent Labs   Lab 02/13/18  0342   WBC 23.4*   Hemoglobin 11.7*   Hematocrit 36.8*   PLT CT 258        Recent Labs   Lab 02/13/18  0342   Sodium 140   Potassium 4.8   Chloride 111*   CO2 17*   BUN 68*   Creatinine 3.09*   EGFR 21*   Glucose 202*   Calcium 8.6        Recent Labs   Lab 02/12/18  0336   Bilirubin, Total 0.4   Protein, Total 5.4*   Albumin 2.1*   ALT 66*   AST (SGOT) 41        Ct Chest Wo Contrast    Result Date: 02/12/2018  Development of bilateral moderate-sized pleural effusions. Associated lower lobe atelectasis and probable consolidation/pneumonia on the right. High density material within these regions,, suggesting aspiration of oral contrast. No distention or gross abnormality identified to the esophagus. ReadingStation:[email or web address]    Fl Modified Barium Swallow W Speech Therapy    Result Date: 02/08/2018  Flash penetration with honey thick consistency. Please see separate speech pathology report for  further details. ReadingStation:[email or web address]    US Renal Kidney Bladder Complete    Result Date: 02/07/2018  1. No hydronephrosis. Echogenic kidneys which may be seen with medical renal disease. Possible 6 mm nonobstructing stone in lower pole the left kidney. 2. No abnormalities identified of the urinary bladder. Prostate enlargement. ReadingStation:[email or web address]    Xr Chest Ap Portable    Result Date: 02/11/2018  Hypoinflated lungs with bronchovascular crowding and bibasilar atelectasis. No definite new focal consolidation to suggest pneumonia. ReadingStation:[email or web address]    Eval / Mgmt / Counseling Time     Evaluation and mgmt spent on advance directives (including resuscitation options/choices), medications (current or proposed therapies and side effects), the principle problem (listed above), the active hospital problems (listed above) and discharge planning issues.  L2

## 2018-02-13 NOTE — Progress Notes (Signed)
MOBILITY NOTE:     Precautions: Weight Bearing: not tested     Activity:  Assisted with rolling in bed     Level of Assistance: Moderate assist (patient does 50-74% of the task) .  Assist of 2 staff     Device(s) used: No device     Positioning frequency: Every 2 hours    Last Vitals: BP 129/62    Pulse (!) 109    Temp 99.7 F (37.6 C) (Oral)    Resp 18    Ht 1.778 m (5\' 10" ) Comment: 5 ft 10 in   Wt 63.7 kg (140 lb 6.9 oz)    SpO2 93%    BMI 20.15 kg/m     Assisted nurse with changing patient. Patient is lying on his right side using pillow support

## 2018-02-13 NOTE — Progress Notes (Signed)
Pharmacy Vancomycin Dosing Consult Note  Wyeth Junior Heatley    Assessment:   1. Day 2 vancomycin and Zosyn day 25 for 78 year old male with aspiration PNA admitted 02/07/18 and found to have tiny subdural hematoma.   2. PMH significant for CAD,diabetes mellitus, hypertension,stage 4 CKDand dementia D stage 4, old stroke, and PE.  3. Patient remains febrile with worsening leukocytosis. All cultures are negative to date. Vancomycin 1 gram IV received 2/2 at 0900. Scr greater than 3 mg/dL today. Pharmacy to dose vancomycin by levels.      Plan:   1. Vancomycin 500 mg IV once tonight at 2000  2. Random level 2/4 at 1600  3. MRSA nares negative  4. Pharmacy will follow the patient's renal function, vancomycin levels, and dosing during the course of therapy. If you have any questions, please contact the pharmacist at [phone].      Indication: PNA  Random level: Pharmacy will redose when level less than 15 mg/L    Age: 78 y.o.  Height: 1.778 m (5\' 10" )  Weight:  63.7 kg (140 lb 6.9 oz)  IBW: 73 kg  DW: 63.7 kg  HD/PD:      Baseline Population Estimate Kinetics:    SCr: 2.6 mg/dL    CrCl: 31.43 ml/min    Ke: 0.022hr-1        t50: 31.5 hrs        Vd: 44.59L        Expected Trough:  mg/L                  Historic Patient Regimen   Date Regimen Weight SCr CrCl Trough Level Adjustments to Dose              Current Patient Regimen   Date Regimen Trough Date/Time Trough Level Pt Specific Ke   2/2 1000mg  x1      2/3 500 mg                       Cultures   Date Source Organism Sensitivities Resistance                                        qSOFA   Date SBP less than/equal to 100 mm Hg RR greater than/equal to 22 breaths/min GCS less than/equal to 14 Total            Recent Labs   Lab 02/13/18  0342 02/12/18  0336 02/11/18  0306 02/10/18  0347  02/08/18  0237   Creatinine 3.09* 2.60* 2.58* 2.65*  More results in Results Review 3.46*   BUN 68* 57* 57* 64*  More results in Results Review 70*   WBC 23.4* 17.4* 14.2*  --   --  11.3*    More results in Results Review = values in this interval not displayed.     Temp (24hrs), Avg:100.3 F (37.9 C), Min:99.5 F (37.5 C), Max:100.9 F (38.3 C)    Vitals:    02/13/18 1549   BP: 129/62   Pulse: (!) 109   Resp: 18   Temp: 99.7 F (37.6 C)   SpO2: 93%

## 2018-02-13 NOTE — Progress Notes (Signed)
Medicine Progress Note   [facility]   Patient Name: [name], [name] LOS: 6 days   Attending Physician: Barbaraann Faster, MD PCP: Melvyn Neth, MD      Hospital Course:                                                            Hendricks Milo Junior Loren is a 78 y.o. male Ms. past medical history old stroke, CAD,diabetes mellitus, hypertension,stage 4 CKD and dementia who came in for extended for and skin laceration.  Patient was recently dced on plavix and eliquis . Eliquis was added on last admit bc of PE and DVT. Patient has dementia patient cannot provide any medical information history obtained from the ER physician and the chart.  Patient was found to have possible tiny subdural hematoma.  Seen by neurosurgical service, recommend observation overnight and repeat a CT scan of the head if there is no hematoma patient can be discharged.  initially held  Plavix/eliquis.  The scalp laceration been sutured. Repeat CT showed no evidence of bleed. Meds resumed. Placement pending as family states they cannot take care of him at home.    Has persistent leukocytosis likely from aspiration PNA.  Cr range seems WNL as per his baseline ckd 4.       Assessment and Plan:      Right sided Aspiration PNA on CT--POA  --> had MBSS-- has op mod dysphagia --diet modified  --  MRSA nares negative  -- Influenza A n B -- neg     - cont zosynday 8  -- >> WBC started going up again with some fever.  I did dry CT today --Development of bilateral moderate-sized pleural effusions. Associated lower lobe atelectasis and probable consolidation/pneumonia on the right. High density material within these regions,, suggesting aspiration of oral contrast.  I still feel still aspirating intermittently   2/2--Give a dose of IV Lasix today .Marland Kitchen IVF dced yesterday   Added iv vancomycin   2/3 febrile and wbc going up      Mod OP dysphagia -- likely from demential on hx of CVA.Marland Kitchen diet adjusted per SLP ( had  MBSS)    Demential liklely senile and vascular -- chronic       Possible tiny acute subdural hematoma on the left, without mass effect  Due to accidental fall  No neuro deficit  Neurosurgery consulted --recommend repeat a CT scan if CT scan no increase of hematoma patient can be discharged  Control blood pressure  --> normal repeat CT, plavix and eliquis resumed on 1/26th     Scalp laceration due to accidental fall  S/P suture  CT of the neck neck did not show to fracture or dislocation      Esophageal foreign body possible food or mass ??  This is detected on the CT of the neck   CT of chest without contrast  --> no such esophageal finding       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   Plavix resumed , continue statin    Stage 4 CKD  Creatinine uptrending despite fluids  Renal ultrasound showed medical renal disease  Cont monitor consider renal consult    ----> my impression is he is still  in his baseline range..( no AKI)      Diarrhea -- possibly abx associated --  c diff study negative     Give imodium        Hypertension -- continue with current care   needs better control      Diabetesmellitus  Cont oral meds  Cont sliding scale insulin  basal insulin         Single-vessel CAD --stable --continue plavix and  statin    Intermittent brady --no BB    Acute Bilateral PE diagnosed Dec 2019  No new acute pe this admit  Eliquisresumed       DVT PPx: Eliquis    Dispo:     Poor long term prognosis-  - supported with CT scan finding  02/12/18 and ongoing fevers and elevated wbc .  - I spoke to palliative care today   -- goal is ideally [place] with hospice/comfort care to [facility]  -- I also talk to POA's  husband today         Code:  DNR ( made with poa 2/1)     Subjective     Patient is demented    Febrile   Doesn't look good today   Demented   NAD otherwise       Objective   Physical Exam:     Vitals: T:99.7 F (37.6 C) (Oral), BP:129/62, HR:(!) 109, RR:18,  SaO2:93%    General: Patient is hypoactive .  HEENT: No conjunctival drainage, vision is intact, anicteric sclera.  Neck: Supple, no thyromegaly.  Chest: mild rhonchi b/l  . dminished at bases .Marland Kitchen no wheezing. No use of accessory muscles.  CVS: Normal rate and regular rhythm no murmurs, without JVD, no pitting edema, pulses palpable.  Abdomen: Soft, non-tender, no guarding or rigidity, with normal bowel sounds.  Extremities: No calf swelling and no gross deformity.  Skin: Warm, dry, no rash and no worrisome lesions.  NEURO: No motor or sensory deficits.  Psychiatric: Alert, less interactive,   Weight Monitoring 11/28/2017 12/29/2017 12/29/2017 12/31/2017 01/01/2018 01/17/2018 01/21/2018   Height 167.6 cm - 167.6 cm - - 177.8 cm 177.8 cm   Weight 66.679 kg 64 kg 64 kg 63.2 kg 62.1 kg 64.91 kg 63.7 kg   Weight Method - Actual - Standing Scale Standing Scale - Bed Scale   BMI (calculated) 23.8 kg/m2 - 22.8 kg/m2 - - 20.6 kg/m2 20.2 kg/m2         Intake/Output Summary (Last 24 hours) at 02/13/2018 1724  Last data filed at 02/12/2018 1900  Gross per 24 hour   Intake 240 ml   Output    Net 240 ml     Body mass index is 20.15 kg/m.     Meds:     Current Facility-Administered Medications   Medication Dose Route Frequency    acetaminophen  500 mg Oral TID    amLODIPine  10 mg Oral QAM    apixaban  5 mg Oral Q12H [facility]    clopidogrel  75 mg Oral QAM    finasteride  5 mg Oral QAM    hydrALAZINE  10 mg Oral Q8H [place]    insulin glargine  12 Units Subcutaneous QHS    insulin lispro (1 Unit Dial)  1-9 Units Subcutaneous TID AC    And    insulin lispro (1 Unit Dial)  1-7 Units Subcutaneous QHS    lactobacillus species  50 Billion CFU Oral Daily    piperacillin-tazobactam  2.25 g  Intravenous Q8H    simvastatin  20 mg Oral QHS    sodium bicarbonate  650 mg Oral BID    sodium chloride (PF)  3 mL Intravenous Q8H    vancomycin therapy placeholder   Does not apply See Admin Instructions    vitamin D  25 mcg Oral QAM       PRN  Meds: acetaminophen, dextrose, glucagon (rDNA), hydrALAZINE, HYDROmorphone, NSG Communication: Glucose POCT order (AC, HS) **AND** insulin lispro (1 Unit Dial) **AND** insulin lispro (1 Unit Dial) **AND** insulin lispro (1 Unit Dial), loperamide, naloxone, ondansetron, promethazine.     LABS:     Estimated Creatinine Clearance: 18 mL/min (A) (based on SCr of 3.09 mg/dL (H)).  Recent Labs   Lab 02/13/18  0342 02/12/18  0336   WBC 23.4* 17.4*   RBC 3.79* 3.46*   Hemoglobin 11.7* 10.5*   Hematocrit 36.8* 33.0*   MCV 97 96   PLT CT 258 285             Lab Results   Component Value Date    HGBA1CPERCNT 5.7 02/09/2018     Recent Labs   Lab 02/13/18  0342 02/12/18  0336 02/11/18  0306   Glucose 202* 87 133*   Sodium 140 142 140   Potassium 4.8 3.9 3.9   Chloride 111* 110 111*   CO2 17* 20 19*   BUN 68* 57* 57*   Creatinine 3.09* 2.60* 2.58*   EGFR 21* 26* 27*   Calcium 8.6 8.2* 8.2*     Recent Labs   Lab 02/12/18  0336 02/11/18  0306   Albumin 2.1* 2.2*   Protein, Total 5.4* 5.4*   Bilirubin, Total 0.4 0.4   Alkaline Phosphatase 59 61   ALT 66* 78*   AST (SGOT) 41 36     Recent Labs   Lab 02/07/18  1220   Specific Gravity, UR 1.019   pH, Urine 5.0   Protein, UR 30*   Glucose, UA Negative   Ketones UA Negative   Bilirubin, UA Negative   Blood, UA Small*   Nitrite, UA Negative   Urobilinogen, UA Normal   Leukocyte Esterase, UA Negative   WBC, UA 4   RBC, UA 2   Bacteria, UA Rare*      Patient Lines/Drains/Airways Status    Active PICC Line / CVC Line / PIV Line / Drain / Airway / Intraosseous Line / Epidural Line / ART Line / Line / Wound / Pressure Ulcer / NG/OG Tube     Name:   Placement date:   Placement time:   Site:   Days:    Peripheral IV 02/04/18 Anterior;Left Upper Arm   02/04/18    1610    Upper Arm   less than 1               Ct Chest Wo Contrast    Result Date: 02/12/2018  Development of bilateral moderate-sized pleural effusions. Associated lower lobe atelectasis and probable consolidation/pneumonia on the right.  High density material within these regions,, suggesting aspiration of oral contrast. No distention or gross abnormality identified to the esophagus. ReadingStation:[email or web address]    Fl Modified Barium Swallow W Speech Therapy    Result Date: 02/08/2018  Flash penetration with honey thick consistency. Please see separate speech pathology report for further details. ReadingStation:[email or web address]    US Renal Kidney Bladder Complete    Result Date: 02/07/2018  1. No hydronephrosis. Echogenic kidneys which may be seen with medical  renal disease. Possible 6 mm nonobstructing stone in lower pole the left kidney. 2. No abnormalities identified of the urinary bladder. Prostate enlargement. ReadingStation:[email or web address]    Xr Chest Ap Portable    Result Date: 02/11/2018  Hypoinflated lungs with bronchovascular crowding and bibasilar atelectasis. No definite new focal consolidation to suggest pneumonia. ReadingStation:[email or web address]          Barbaraann Faster, MD     02/13/18,5:24 PM   MRN: [id number]                                      CSN: [id number] DOB: 1940-06-06

## 2018-02-13 NOTE — Progress Notes (Signed)
[facility]   Patient Name: Wardell Heath   Attending Physician: Barbaraann Faster, MD   Today's date:   02/13/2018 LOS: 6 days   Expected Discharge Date Expected Discharge Date: (pending placement)    Quick  Assessment:                                                              ReAdmit Risk Score: 20    CM Comments: DCP 02/13/2018: Pt adm s/p fall, subdural hematoma.  PTA lived with niece as pcg.  Plan is for pt to be placed in LTC facility.  Medicaid is pending, but [facility] has offered a bed and started auth  for pt to come under his [employer] Medicare HMO: patient notes to have change in status, Poor long term prognosis, supported with CT scan finding today 02/12/18 and elevated wbc , awaiting palliative care consult    Physical Discharge Disposition: [place]                                                                          Physical Discharge Disposition: [place]       Provider Notifications:            Ritta Slot LPN /DCP   [id number]

## 2018-02-13 NOTE — Progress Notes (Signed)
Nutrition Therapy  Nutrition Screen    Patient Information:     Name:[name]   Age: 78 y.o.   Sex: male     MRN: [id number]    Reason For Screen:     Length of Stay    Nutrition Assessment:     Pt admit s/p fall, subdural hematoma. Was eating a pureed diet with honey thick liquids following a MBSS. Pt has developed fever with likely aspiration pneumonia, now less responsive. Palliative care consulted, plans for goals of care meeting tomorrow with family. It is noted that pt is not following commands and family is desiring comfort as highest priority.        Nutrition Risk Level: Low      No further recommendations at this time.  RD to follow per policy, nutritional status change or MD [place], RD  02/13/2018 2:57 PM

## 2018-02-13 NOTE — Plan of Care (Addendum)
NURSE NOTE SUMMARY  [facility]   Patient Name: [name]   Attending Physician: Barbaraann Faster, MD   Today's date:   02/13/2018 LOS: 6 days   Shift Summary:                                                              Assumed care of patient around 1900. Patient in bed resting with eyes closed. All safety checks complete. Bed alarm set and call bell within reach. Patient not showing any signs of pain or distress. Will continue to monitor.   2000: Assessment complete. Patient alert with eyes open. Patient non-verbal and not following commands at this time. All due medications administered at this time. Will continue to monitor.    Provider Notifications:   1916: Dr. Theodosia Quay regarding patient blood sugar of 123 and Lantus order for 12 units. Ordered to give only 5 units.    Rapid Response Notifications:  Mobility:      PMP Activity: Step 3 - Bed Mobility (02/13/2018 11:44 PM)     Weight tracking:  Family Dynamic:   No data found.            Recent Vitals Last Bowel Movement   BP: 129/62 (02/13/2018  9:07 PM)  Heart Rate: (!) 109 (02/13/2018  9:07 PM)  Temp: 99.7 F (37.6 C) (02/13/2018  7:16 PM)  Resp Rate: 18 (02/13/2018  7:16 PM)  SpO2: 93 % (02/13/2018  7:16 PM)   Last BM Date: 02/13/18       Problem: Neurological Deficit  Goal: Neurological status is stable or improving  Description  Interventions:  1. Monitor/assess/document neurological assessment (Stroke: every 4 hours)  2. Monitor/assess NIH Stroke Scale   3. Re-assess NIH Stroke Scale for any change in status  4. Observe for seizure activity and initiate seizure precautions if indicated  5. Perform CAM Assessment  Outcome: Progressing  Flowsheets (Taken 02/13/2018 2345)  Neurological status is stable or improving: Monitor/assess/document neurological assessment (Stroke: every 4 hours); Perform CAM Assessment     Problem: Potential for Aspiration  Goal: Risk of aspiration will be  minimized  Description  Interventions:  1. Dysphagia screen: Keep patient NPO if patient fails screening   2. Assess and monitor ability to swallow   3. Place swallow precaution signage above bed  4. Monitor/assess for signs of aspiration (tachypnea, cough, wheezing, clearing throat, hoarseness after eating, decrease in SaO2)  5. Order modified texture diet as recommended by Speech Pathologist  6. Thicken liquids as recommended by Speech Pathologist  7. Place patient up in chair to eat, if possible  8. Head of bed up 90 degrees to eat if unable to be out of bed  9. Supervise patient during oral intake  10. Instruct patient to take small bites  11. Instruct patient to take small single sips of liquid  12. Do not use a straw  13. Consult/collaborate with Speech Pathologist for dysphagia  Outcome: Progressing  Flowsheets (Taken 02/12/2018 0245 by Luna Fuse, RN)  Risk of aspiration will be minimized : Monitor/assess for signs of aspiration (tachypnea, cough, wheezing, clearing throat, hoarseness after eating, decrease in SaO2);Order modified texture diet as recommended by Speech Pathologist;Place patient up in chair to eat, if possible;Head of bed up  90 degrees to eat if unable to be out of bed     Problem: Impaired Mobility  Goal: Mobility/Activity is maintained at optimal level for patient  Description  Interventions:  1. Increase mobility as tolerated/progressive mobility  2. Encourage independent activity per ability  3. Maintain proper body alignment  4. Perform active/passive ROM  5. Plan activities to conserve energy, plan rest periods  6. Reposition patient every 2 hours and as needed unless able to reposition self  7. Assess for changes in respiratory status, level of consciousness and/or development of fatigue  8. Consult/collaborate with Physical Therapy and/or Occupational Therapy  Outcome: Progressing  Flowsheets (Taken 02/09/2018 0230 by Luna Fuse, RN)  Mobility/activity is maintained at optimal  level for patient: Increase mobility as tolerated/progressive mobility;Perform active/passive ROM;Encourage independent activity per ability;Maintain proper body alignment;Plan activities to conserve energy, plan rest periods;Reposition patient every 2 hours and as needed unless able to reposition self;Assess for changes in respiratory status, level of consciousness and/or development of fatigue;Consult/collaborate with Physical Therapy and/or Occupational Therapy

## 2018-02-13 NOTE — Plan of Care (Addendum)
NURSE NOTE SUMMARY  [facility]   Patient Name: [name]   Attending Physician: Barbaraann Faster, MD   Today's date:   02/13/2018 LOS: 6 days   Shift Summary:                                                              Pt incontinent of BM twice this shift and changed. Pt had mild fever and given tylenol. Pt slept most of the day and has not been as interactive this shift. No episodes of vomiting this shift. Bed alarm on with call bell in place. Will continue to monitor.   Provider Notifications:      Rapid Response Notifications:  Mobility:      PMP Activity: Step 3 - Bed Mobility (02/13/2018  6:00 PM)     Weight tracking:  Family Dynamic:   No data found.          Recent Vitals Last Bowel Movement   BP: 129/62 (02/13/2018  3:49 PM)  Heart Rate: (!) 109 (02/13/2018  3:49 PM)  Temp: 99.7 F (37.6 C) (02/13/2018  3:49 PM)  Resp Rate: 18 (02/13/2018  3:49 PM)  SpO2: 93 % (02/13/2018  3:49 PM)   Last BM Date: 02/13/18       Problem: Moderate/High Fall Risk Score >5  Goal: Patient will remain free of falls  Outcome: Progressing

## 2018-02-14 LAB — COMPREHENSIVE METABOLIC PANEL
ALT: 72 U/L — ABNORMAL HIGH (ref 0–55)
AST (SGOT): 62 U/L — ABNORMAL HIGH (ref 10–42)
Albumin/Globulin Ratio: 0.59 Ratio — ABNORMAL LOW (ref 0.80–2.00)
Albumin: 2 gm/dL — ABNORMAL LOW (ref 3.5–5.0)
Alkaline Phosphatase: 58 U/L (ref 40–145)
Anion Gap: 14.9 mMol/L (ref 7.0–18.0)
BUN / Creatinine Ratio: 24.7 Ratio (ref 10.0–30.0)
BUN: 74 mg/dL — ABNORMAL HIGH (ref 7–22)
Bilirubin, Total: 0.4 mg/dL (ref 0.1–1.2)
CO2: 22 mMol/L (ref 20–30)
Calcium: 8.4 mg/dL — ABNORMAL LOW (ref 8.5–10.5)
Chloride: 111 mMol/L — ABNORMAL HIGH (ref 98–110)
Creatinine: 3 mg/dL — ABNORMAL HIGH (ref 0.80–1.30)
EGFR: 22 mL/min/{1.73_m2} — ABNORMAL LOW (ref 60–150)
Globulin: 3.4 gm/dL (ref 2.0–4.0)
Glucose: 84 mg/dL (ref 71–99)
Osmolality Calculated: 308 mOsm/kg — ABNORMAL HIGH (ref 275–300)
Potassium: 3.9 mMol/L (ref 3.5–5.3)
Protein, Total: 5.4 gm/dL — ABNORMAL LOW (ref 6.0–8.3)
Sodium: 144 mMol/L (ref 136–147)

## 2018-02-14 LAB — CBC AND DIFFERENTIAL
Basophils %: 0.2 % (ref 0.0–3.0)
Basophils Absolute: 0 10*3/uL (ref 0.0–0.3)
Eosinophils %: 1.5 % (ref 0.0–7.0)
Eosinophils Absolute: 0.3 10*3/uL (ref 0.0–0.8)
Hematocrit: 32.2 % — ABNORMAL LOW (ref 39.0–52.5)
Hemoglobin: 10.2 gm/dL — ABNORMAL LOW (ref 13.0–17.5)
Lymphocytes Absolute: 1.5 10*3/uL (ref 0.6–5.1)
Lymphocytes: 9.1 % — ABNORMAL LOW (ref 15.0–46.0)
MCH: 31 pg (ref 28–35)
MCHC: 32 gm/dL (ref 32–36)
MCV: 97 fL (ref 80–100)
MPV: 8.2 fL (ref 6.0–10.0)
Monocytes Absolute: 1.4 10*3/uL (ref 0.1–1.7)
Monocytes: 8.3 % (ref 3.0–15.0)
Neutrophils %: 80.9 % — ABNORMAL HIGH (ref 42.0–78.0)
Neutrophils Absolute: 13.5 10*3/uL — ABNORMAL HIGH (ref 1.7–8.6)
PLT CT: 330 10*3/uL (ref 130–440)
RBC: 3.33 10*6/uL — ABNORMAL LOW (ref 4.00–5.70)
RDW: 12 % (ref 11.0–14.0)
WBC: 16.7 10*3/uL — ABNORMAL HIGH (ref 4.0–11.0)

## 2018-02-14 LAB — VH DEXTROSE STICK GLUCOSE
Glucose POCT: 93 mg/dL (ref 71–99)
Glucose POCT: 103 mg/dL — ABNORMAL HIGH (ref 71–99)
Glucose POCT: 133 mg/dL — ABNORMAL HIGH (ref 71–99)
Glucose POCT: 207 mg/dL — ABNORMAL HIGH (ref 71–99)
Glucose POCT: 232 mg/dL — ABNORMAL HIGH (ref 71–99)

## 2018-02-14 LAB — VANCOMYCIN, RANDOM: Vancomycin Random: 12.3 ug/mL (ref 5.0–40.0)

## 2018-02-14 MED ORDER — HALOPERIDOL LACTATE 2 MG/ML PO CONC
0.50 mg | Freq: Every evening | ORAL | Status: DC
Start: 2018-02-14 — End: 2018-02-24
  Administered 2018-02-14 – 2018-02-23 (×10): 0.5 mg via ORAL
  Filled 2018-02-14 (×12): qty 5

## 2018-02-14 MED ORDER — HALOPERIDOL LACTATE 2 MG/ML PO CONC
0.50 mg | ORAL | Status: DC | PRN
Start: 2018-02-14 — End: 2018-02-25
  Administered 2018-02-16 – 2018-02-17 (×3): 0.5 mg via ORAL
  Filled 2018-02-14 (×5): qty 5

## 2018-02-14 MED ORDER — ACETAMINOPHEN 160 MG/5ML PO SOLN
325.00 mg | Freq: Three times a day (TID) | ORAL | Status: DC
Start: 2018-02-14 — End: 2018-02-25
  Administered 2018-02-14 – 2018-02-24 (×29): 325 mg via ORAL
  Filled 2018-02-14 (×8): qty 10.15
  Filled 2018-02-14: qty 20.3
  Filled 2018-02-14 (×29): qty 10.15
  Filled 2018-02-14: qty 20.3
  Filled 2018-02-14 (×22): qty 10.15

## 2018-02-14 MED ORDER — SODIUM CHLORIDE 0.9 % IV SOLN
750.00 mg | Freq: Once | INTRAVENOUS | Status: AC
Start: 2018-02-14 — End: 2018-02-14
  Administered 2018-02-14: 18:00:00 750 mg via INTRAVENOUS
  Filled 2018-02-14: qty 0.75

## 2018-02-14 MED ORDER — BACLOFEN 10 MG PO TABS
5.0000 mg | ORAL_TABLET | Freq: Four times a day (QID) | ORAL | Status: DC | PRN
Start: 2018-02-14 — End: 2018-02-21
  Administered 2018-02-14 – 2018-02-15 (×2): 5 mg via ORAL
  Filled 2018-02-14 (×2): qty 1

## 2018-02-14 NOTE — Progress Notes (Signed)
[facility]  Follow-up Assessment  Date, Time: 02/14/18 11:28 AM  Patient Name: [name]  Referring Physician:  Barbaraann Faster, MD   Primary Care Physician: Berdine Dance Clarita Crane, MD  Consulting Team: Odis Hollingshead, MD; Filbert Schilder, MD; Eulogio Ditch, PA; Adaline Sill, [place]  Consulting Service: Palliative Medicine  Reason for Follow-up: goals of care, advance care planning, clarification of prognosis and hospice information and/or referral  Palliative care is a specialized, interdisciplinary approach to improving comfort and quality of life at any stage of a serious illness by addressing symptoms, communication, and transitions.       Assessment & Recommendations   Impression / Assessment:  Metabolic encephalopathy  Aspiration pneumonia  Advanced vascular dementia  Acute kidney injury on chronic kidney disease    Recommendations / Plan:  Pain:   Scheduled Tylenol liquid 325 mg 3 times daily  Has Dilaudid 1 mg p.o. every 3 hours as needed pain    Non-pain Symptoms:     Agitation  Add Haldol 0.5 mg SL q HS  Keep Haldol 0.5 mg SL q 4 hrs for uncontrolled agitation    Other medication recommendations:   Recommend deprescribing the following medications:  meds that lack efficacy at the end-of-life:  Zocor, Proscar  meds whose risk(s) outweigh benefits:  Eliquis, Plavix  meds that add to pill burden:  Vitamin D    Nutrition: comfort feeding as tolerated  Consultants: Hospice and Pastoral care  Goals of care:     Detailed discussion was held with patient's niece [name] and son-in-law at the bedside. Details regarding the patient's overall clinical condition over the past several months were shared by the family.   It seems the patient was living in [place] for the past 5 years with patient's  nephew.  Since his return to [place] to live with his niece in October 2019, he was found to have had a severe decline in overall functional status and  was significantly different in his overall behavior from the time he was last seen a few years ago by his neice.   At this point he was somewhat able to ambulate with a walker/assisstance but had turned quite irritable and occasionally belligerent.  He had also lost verbal capacity significantly and was only able to speak a few intelligible words.  He also developed urinary incontinence as well has occasional bowel incontinence.  Because of his overall decline in functional status, his niece was no longer able to care for him and was apparently seeking nursing home placement even prior to this hospital admission.  During the course of the hospital admission, he was found to have recurrent aspiration with pneumonia not improving despite antibiotics.   As his MPOA, Ms. [name] wishes for comfort to be the sole focus of [name]'s care going forward and she would like to avoid frequent hospitalizations.  She also realizes that he also has advanced renal failure and that he is not a good candidate for dialysis initiation.  She also wishes to avoid any further escalation of medical care.  Should his condition decline and he be unable to tolerate oral diet she does not wish for him to have a feeding tube as it would likely not improve his comfort.  We discussed that based on his dementia as well as his other comorbidities including advanced renal failure he is likely candidate for hospice.  Ms. [name] was agreeable to obtaining a hospice informational visit in the coming days  also to discontinue treatments and medications that were not focused on his comfort.   At this point we understood that Case management team is assisting with appropriate discharge planning as he likely requires long-term placement.      The patient/family wants highest priority given to comfort, as opposed to independence (function) or longevity.    Advance Directives:    Has Advance Directives: [x]  Yes []  No    Discussed: [x]  Yes []  No            Current CPR Status:  NO CPR  -  ALLOW NATURAL DEATH     Disposition / Further attention / Follow-up:     Continue to follow for symptom management    Discussed with: Dr Bennetta Laos, CM Tamika    Please do not hesitate to contact the Palliative Care Service if you have questions about the above recommendations.     Filbert Schilder, MD    Palliative Care Service, [facility] [place]  [place], [place] [address]            Interval History   All interval notes reviewed.   Med list reviewed.     CC: more alert    Patient is more alert and responsive today but only able to grunt in response to questions about dyspnea and pain.    Pain: improved  Dyspnea: none  N/V: resolved  Constipation/Diarrhea: Diarrhea resolving- had 2 loose bowel movements this morning  Anxiety/Depression: none  Other Distress: none    Medications     Medications:       Current Facility-Administered Medications   Medication Dose Route Frequency    acetaminophen  325 mg Oral TID    amLODIPine  10 mg Oral QAM    clopidogrel  75 mg Oral QAM    finasteride  5 mg Oral QAM    insulin glargine  12 Units Subcutaneous QHS    insulin lispro (1 Unit Dial)  1-9 Units Subcutaneous TID AC    And    insulin lispro (1 Unit Dial)  1-7 Units Subcutaneous QHS    lactobacillus species  50 Billion CFU Oral Daily    piperacillin-tazobactam  2.25 g Intravenous Q8H    sodium chloride (PF)  3 mL Intravenous Q8H    vancomycin therapy placeholder   Does not apply See Admin Instructions       acetaminophen, baclofen, dextrose, glucagon (rDNA), hydrALAZINE, HYDROmorphone, NSG Communication: Glucose POCT order (AC, HS) **AND** insulin lispro (1 Unit Dial) **AND** insulin lispro (1 Unit Dial) **AND** insulin lispro (1 Unit Dial), loperamide, naloxone, ondansetron, promethazine    Allergies     No Known Allergies    Physical Exam     BP 127/67    Pulse (!) 119    Temp 97 F (36.1 C) (Oral)    Resp 17    Ht 1.778 m (5\' 10" ) Comment: 5 ft 10 in   Wt 63.7 kg  (140 lb 6.9 oz)    SpO2 96%    BMI 20.15 kg/m     Physical Exam   Constitutional: No distress.   Eyes: No scleral icterus.   Cardiovascular: Normal rate, regular rhythm and normal heart sounds.   No murmur heard.  Pulmonary/Chest: Effort normal. No respiratory distress. He has rales.   Abdominal: Soft. Bowel sounds are normal. He exhibits no distension. There is no abdominal tenderness.   Neurological: He is alert.   Vitals reviewed.    Labs /  Radiology     Lab and diagnostics: reviewed by me.  Recent Labs   Lab 02/14/18  0346   WBC 16.7*   Hemoglobin 10.2*   Hematocrit 32.2*   PLT CT 330     Recent Labs   Lab 02/14/18  0346   Sodium 144   Potassium 3.9   Chloride 111*   CO2 22   BUN 74*   Creatinine 3.00*   EGFR 22*   Glucose 84   Calcium 8.4*     Recent Labs   Lab 02/14/18  0346   Bilirubin, Total 0.4   Protein, Total 5.4*   Albumin 2.0*   ALT 72*   AST (SGOT) 62*     No results found.    Eval / Mgmt / Counseling Time   I have spent 35 minutes with the patient and/or family members as well as care team members, discussing palliative care concepts, end of life symptom management, hospice care and/or referral, medications (current or proposed therapies and side effects), the principle problem (listed above), the active hospital problems (listed above) and discharge planning issues.  More than 50% of this time was spent counseling and coordinating care.  1010 - 1045a  L3  EXTENDED TIME  - Face-to-face  30 additional minutes spent on discussion of discharge planning issues and hospice referral  [phone]

## 2018-02-14 NOTE — Progress Notes (Signed)
Pharmacy Vancomycin Dosing Consult Note  Stepfon Junior Stevick    Assessment:   1. Day # 3 Vancomycin/# 41 Zosyn for 78 year old DM2 male with aspiration PNA admitted 02/07/18 and found to have tiny subdural hematoma.  CT 2/2 shows development of bilateral moderate-sized pleural effusions. Associated lower lobe atelectasis and probable consolidation/pneumonia on the right. High density material within these regions,, suggesting aspiration of oral contrast.  2. PMH significant for CAD,diabetes mellitus, hypertension,stage 4 CKDand dementia D stage 4, old stroke, and PE.  3. Leukocytosis improving, afebrile, elevated SCr; MRSA/Flu swabs are negative  4. Random level 2/4 @ 1500 =  12.3 mg/L     Plan:   1. Vancomycin 750 mg IV x 1 2/4  2. Next random level TBD  3. MRSA nares negative  4. Pharmacy will follow the patient's renal function, vancomycin levels, and dosing during the course of therapy. If you have any questions, please contact the pharmacist at [phone].      Indication: PNA  Random level: Pharmacy will redose when level less than 15 mg/L    Age: 78 y.o.  Height: 1.778 m (5\' 10" )  Weight:  63.7 kg (140 lb 6.9 oz)  IBW: 73 kg  DW: 63.7 kg  HD/PD: N/A     Baseline Population Estimate Kinetics:    SCr: 3 mg/dL    CrCl: 18.6 ml/min    Ke: 0.0198 hr-1        t50: 35 hrs        Vd: 44.59L        Expected Trough:  mg/L                  Historic Patient Regimen   Date Regimen Weight SCr CrCl Trough Level Adjustments to Dose              Current Patient Regimen   Date Regimen Trough Date/Time Trough Level Pt Specific Ke   2/2  1000mg  x1      2/3  500 mg x 1   Random 2/4 @ 1500  12.3 mg/L    2/4  750 mg x 1               Cultures   Date Source Organism Sensitivities Resistance                                        qSOFA   Date SBP less than/equal to 100 mm Hg RR greater than/equal to 22 breaths/min GCS less than/equal to 14 Total    2/4  0   0   1  1     Recent Labs   Lab 02/14/18  0346 02/13/18  0342 02/12/18  0336  02/11/18  0306   Creatinine 3.00* 3.09* 2.60* 2.58*   BUN 74* 68* 57* 57*   WBC 16.7* 23.4* 17.4* 14.2*     Temp (24hrs), Avg:99.1 F (37.3 C), Min:97 F (36.1 C), Max:100.4 F (38 C)    Vitals:    02/14/18 0743   BP: 122/66   Pulse: (!) 101   Resp: 18   Temp: 97 F (36.1 C)   SpO2: 95%

## 2018-02-14 NOTE — Progress Notes (Signed)
RRT called for BPA sepsis alert.  Ongoing treatment for pneumonia.    Attending note indicated starting comfort care.  Poor prognosis.  Treatment for pneumonia is appropriate.  I will defer further testing for sepsis based on the transition to comfort    Otelia Santee RN RRT

## 2018-02-14 NOTE — PT Progress Note (Signed)
Physical Therapy PROGRESS note                             [facility]: [facility]  Patient: [name]     CSN: [id number]    Bed: 4505/4505    Visit#: 5   Treatment Frequency: 2-3x/wk  Last seen by a physical therapist vs. Physical therapist assistant: 02/09/18    DISCHARGE RECOMMENDATIONS   Discharge Recommendations:   LTC       *Discharge recommendations are subject to change based on patient's progress and/or home support changes - please refer to most recent PT note for current recommendation    DME recommended for Discharge:   TBD at next level of care    PMP (Progressive Mobility Program) Recommendations:   Recommend patient  dangle edge of bed 2-3 times/day with physical assist and/or supervision of 2 staff, be repositioned in bed every 2 hours as tolerated.     Precautions and Contraindications:   Falls  Mobility protocol  Patient is nonverbal - does best with demonstrated simple cues and automatic movements    Note: Patient's family prefers patient to have shoes donned during mobility.    PT Assessment and Plan of Care (Treatment frequency noted above):   HPI (per physician charting) and Pertinent Medical Details:    Admitted 01/19/2018 with/for fall with finding of possible tiny acute subdural hematoma on left without mass effect and with scalp laceration. Patient also with noted esophageal foreign body, possible food or mass, on CT of neck. Patient with past medical history significant for CVA with right residual deficits.       Goals:     LTGs: (By d/c)  1. Patient will perform bed mobilitywith minimal assistancein prep for out of bed activity. ONGOING  2. Patient will perform sit to stand transfersMinimal assistance (assist x2)in prep for gait. ONGOING  3. Patient will ambulate33feet with moderate assistance (assist x2)in prep for home mobility. ONGOING    PT Assessment:  Patients progress towards established goals: Patient able to tolerate sitting on edge of bed for 7  minutes with varying levels of assistance required to maintain sitting balance, from close supervision assist to moderate assist. Patient was maximal assist of 2 for supine <> sit transfers. [name] was able to attempt 2 sit to stand reps with maximal assist of 2, but was unable to fully complete transfer.     Treatment plan: Continue plan of care     Subjective:   Patient is non-verbal   Patient/family/caregiver consent to therapy session is noted by the participation in the therapy session.    Pain:  At rest - Patient did not appear to be in pain  With activity- patient rubbed his left knee - 4/10 via faces scale     OBJECTIVE:   Observation of Patient/Vital Signs:   Patient is in bed with Bed/chair alarm on, telemetry  Patients medical condition is appropriate for Physical therapy intervention at this time.    Vital Signs:  Stable with no signs/symptoms of distress    Oriented to: Unable to assess due to patient is non-verbal  Alertness/Arousal: Inconsistent responses to stimuli   Memory: UTA=Unable to assess  Safety Awareness: UTA=unable to assess    Musculoskeletal and Balance Details:             Bed Mobility:   Rolling to Left:  Moderate assist.   Bed rail used  Rolling to Right:  Maximal assist (assist x2 for safety).       Supine scooting:  Dependent (assist x2 for safety)   Supine to Sit:   Maximal assist (assist x2 for safety).   Cues for Sequencing., Cues for Hand placement., assist at trunk, assist at LE(s)  Sit to Supine:   Maximal assist (assist x2 for safety).   Cues for Sequencing., Cues for Hand placement., assist at trunk, assist at LE(s)  Seated Scooting:   Maximal assist    Transfers:  Patient able to perform 2 partial sit to stand transfers with maximal assist of 2 and gait belt.     Other Treatment Interventions this session:   Therapeutic exercise: knee flexion - PROM on right LE 10x, maximal assist on left LE 10x  Therapeutic activity  Balance training/Neuromuscular Re-ed:  Sitting:  with  assistance, with instruction, with support, patient able to tolerate sitting for 7 minutes on side of bed. [name] required varying levels of assistance to maintain sitting balance from close supervision to moderate assist.   Patient/family/caregiver education     Education Provided:   TOPICS: role of physical therapy, plan of care, goals of therapy and safety with mobility and ADLs, benefits of activity, energy conservation techniques    Learner educated: Patient  Method: Explanation  Response to education: Needs reinforcement and Questionable understanding    Patient Position at End of Treatment:   Left side lying, in bed, Needs in reach, Bed/chair alarm set and No distress    Team Communication:   Spoke to : RN/LPN - Ria Comment  Regarding: Pre-session re: patient status, Patient participation with Therapy  Whiteboard updated: No  PT/PTA communication: via written note and verbal communication as needed.    Time of treatment:   Time Calculation  PT Received On: 02/14/18  Start Time: 1314  Stop Time: 1340  Time Calculation (min): 16 min    Rony Ratz M Harless Molinari

## 2018-02-14 NOTE — OT Plan of Care Note (Signed)
[place] [id number]  Department of Rehabilitation Services  [phone]    Mansel Strother Kirvin CSN: [id number]  [facility] [place] 4505/4505    Occupational Therapy General Note  Time: 0900 and 1515    Attempted to see pt for OT treatment; however, this AM, patient/family with plans to meet with palliative care to discuss plan of care options. Then on second attempt this afternoon, patient declining (shaking head no). Patient was lethargic and not follow commands readily for safe mobility at this time. RN stating that patient had worked with PT earlier this afternoon and may be fatigued.    Team communication: RN Mendel Ryder    Plan: Will follow up tomorrow per POC and pending hospice consult in the AM.    [place] Mckenzie Toruno, OTR/L

## 2018-02-14 NOTE — Progress Notes (Signed)
Medicine Progress Note   [facility]   Patient Name: [name], [name] LOS: 7 days   Attending Physician: Barbaraann Faster, MD PCP: Melvyn Neth, MD      Hospital Course:                                                            Hendricks Milo Junior Hoffman is a 78 y.o. male Ms. past medical history old stroke, CAD,diabetes mellitus, hypertension,stage 4 CKD and dementia who came in for extended for and skin laceration.  Patient was recently dced on plavix and eliquis . Eliquis was added on last admit bc of PE and DVT. Patient has dementia patient cannot provide any medical information history obtained from the ER physician and the chart.  Patient was found to have possible tiny subdural hematoma.  Seen by neurosurgical service, recommend observation overnight and repeat a CT scan of the head if there is no hematoma patient can be discharged.  initially held  Plavix/eliquis.  The scalp laceration been sutured. Repeat CT showed no evidence of bleed. Meds resumed. Placement pending as family states they cannot take care of him at home.    Has persistent leukocytosis likely from aspiration PNA.  Cr range seems WNL as per his baseline ckd 4.    Salted palliative care because of the re-escalating issues and poor prognosis.  Family seems to be agreeable transitioning to comfort or hospice type care.         Assessment and Plan:      Right sided Aspiration PNA on CT--POA  --> had MBSS-- has op mod dysphagia --diet modified  --  MRSA nares negative  -- Influenza A n B -- neg     Patient's aspiration pneumonia initially seem to be getting better however patient took a turn into the weekend.  We cannot as CT repeated shows worsening.  Patient just started having fevers and white cell count kept going up  Vancomycin added to Zosyn  2/4 transitioning to comfort care.. Continue antibiotics for now though.       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  This is detected on the CT of the neck   CT of chest without contrast  --> no such esophageal finding       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   Plavix resumed , continue statin    Stage 4 CKD  Creatinine uptrending despite fluids  Renal ultrasound showed medical renal disease  Cont  monitor consider renal consult    ----> my impression is he is still in his baseline range..( no AKI)   --->> This time goes by anticipate patient will go into AKI      Diarrhea -- possibly abx associated --  c diff study negative     Give imodium as needed      Hypertension -- continue with current care   No need for aggressive management because of comfort care      Diabetesmellitus  Cont oral meds  Cont sliding scale insulin  basal insulin         Single-vessel CAD --stable --continue plavix and  statin    Intermittent brady --no BB    Acute Bilateral PE diagnosed Dec 2019  No new acute pe this admit  Eliquisresumed       DVT PPx: Eliquis    Dispo:     Poor long term prognosis-  Transitioning into comfort care.  Long-term placement is pending.  Consult health care in [place] did start authorization process.  If it is not possible to set up a separate hospice when patient goes to long-term care it may be considered to discharge patient to long-term care or  skilled nursing home with the instructions to keep the patient there with comfort medications and not to send the patient back to hospital as he declines.  I called case management to discuss this however I think they are gone for the day.  This needs to be communicated with case management.        Code:  DNR ( made with poa 2/1)     Subjective     Seems a bit perked up today  Demented   NAD otherwise       Objective   Physical Exam:     Vitals: T:100.4 F (38 C) (Oral), BP:139/70, HR:74, RR:18, SaO2:96%    General: Patient is hypoactive .  HEENT: No conjunctival drainage, vision is intact, anicteric sclera.  Neck: Supple, no thyromegaly.  Chest: mild rhonchi b/l  . dminished at bases .Marland Kitchen no wheezing. No use of accessory muscles.  CVS: Normal rate and regular rhythm no murmurs, without JVD, no pitting edema, pulses palpable.  Abdomen: Soft, non-tender, no guarding or rigidity, with normal bowel sounds.  Extremities: No calf swelling and no gross deformity.  Skin: Warm, dry, no rash and no worrisome lesions.  NEURO: No motor or sensory deficits.  Psychiatric: Alert, less interactive,   Weight Monitoring 11/28/2017 12/29/2017 12/29/2017 12/31/2017 01/01/2018 01/17/2018 01/13/2018   Height 167.6 cm - 167.6 cm - - 177.8 cm 177.8 cm   Weight 66.679 kg 64 kg 64 kg 63.2 kg 62.1 kg 64.91 kg 63.7 kg   Weight Method - Actual - Standing Scale Standing Scale - Bed Scale   BMI (calculated) 23.8 kg/m2 - 22.8 kg/m2 - - 20.6 kg/m2 20.2 kg/m2         Intake/Output Summary (Last 24 hours) at 02/14/2018 1706  Last data filed at 02/14/2018 1200  Gross per 24 hour   Intake 720 ml   Output    Net 720 ml     Body mass index is 20.15 kg/m.     Meds:     Current Facility-Administered Medications   Medication Dose Route Frequency    acetaminophen  325 mg Oral TID    amLODIPine  10 mg Oral QAM    finasteride  5 mg Oral QAM    haloperidol  0.5 mg Oral QHS  insulin glargine  12 Units Subcutaneous QHS    insulin lispro (1 Unit Dial)  1-9  Units Subcutaneous TID AC    And    insulin lispro (1 Unit Dial)  1-7 Units Subcutaneous QHS    lactobacillus species  50 Billion CFU Oral Daily    piperacillin-tazobactam  2.25 g Intravenous Q8H    sodium chloride (PF)  3 mL Intravenous Q8H    vancomycin  750 mg Intravenous Once    vancomycin therapy placeholder   Does not apply See Admin Instructions       PRN Meds: acetaminophen, baclofen, dextrose, glucagon (rDNA), haloperidol, hydrALAZINE, HYDROmorphone, NSG Communication: Glucose POCT order (AC, HS) **AND** insulin lispro (1 Unit Dial) **AND** insulin lispro (1 Unit Dial) **AND** insulin lispro (1 Unit Dial), loperamide, naloxone, ondansetron, promethazine.     LABS:     Estimated Creatinine Clearance: 18.6 mL/min (A) (based on SCr of 3 mg/dL (H)).  Recent Labs   Lab 02/14/18  0346 02/13/18  0342   WBC 16.7* 23.4*   RBC 3.33* 3.79*   Hemoglobin 10.2* 11.7*   Hematocrit 32.2* 36.8*   MCV 97 97   PLT CT 330 258             Lab Results   Component Value Date    HGBA1CPERCNT 5.7 02/07/2018     Recent Labs   Lab 02/14/18  0346 02/13/18  0342 02/12/18  0336   Glucose 84 202* 87   Sodium 144 140 142   Potassium 3.9 4.8 3.9   Chloride 111* 111* 110   CO2 22 17* 20   BUN 74* 68* 57*   Creatinine 3.00* 3.09* 2.60*   EGFR 22* 21* 26*   Calcium 8.4* 8.6 8.2*     Recent Labs   Lab 02/14/18  0346 02/12/18  0336   Albumin 2.0* 2.1*   Protein, Total 5.4* 5.4*   Bilirubin, Total 0.4 0.4   Alkaline Phosphatase 58 59   ALT 72* 66*   AST (SGOT) 62* 41           Invalid input(s):  AMORPHOUSUA   Patient Lines/Drains/Airways Status    Active PICC Line / CVC Line / PIV Line / Drain / Airway / Intraosseous Line / Epidural Line / ART Line / Line / Wound / Pressure Ulcer / NG/OG Tube     Name:   Placement date:   Placement time:   Site:   Days:    Peripheral IV 02/04/18 Anterior;Left Upper Arm   02/04/18    1610    Upper Arm   less than 1               Ct Chest Wo Contrast    Result Date: 02/12/2018  Development of bilateral  moderate-sized pleural effusions. Associated lower lobe atelectasis and probable consolidation/pneumonia on the right. High density material within these regions,, suggesting aspiration of oral contrast. No distention or gross abnormality identified to the esophagus. ReadingStation:[email or web address]    Fl Modified Barium Swallow W Speech Therapy    Result Date: 02/08/2018  Flash penetration with honey thick consistency. Please see separate speech pathology report for further details. ReadingStation:[email or web address]    Xr Chest Ap Portable    Result Date: 02/11/2018  Hypoinflated lungs with bronchovascular crowding and bibasilar atelectasis. No definite new focal consolidation to suggest pneumonia. ReadingStation:[email or web address]          Barbaraann Faster, MD     02/14/18,5:06 PM   MRN: [id number]  CSN: [id number] DOB: Sep 02, 1940

## 2018-02-14 NOTE — Progress Notes (Signed)
[facility]   Patient Name: [name]   Attending Physician: Barbaraann Faster, MD   Today's date:   02/14/2018 LOS: 7 days   Expected Discharge Date Expected Discharge Date: (pending placement)    Quick  Assessment:                                                              ReAdmit Risk Score: 24    CM Comments: DCP 02/14/2018: Pt adm s/p fall, subdural hematoma.  PTA lived with niece as pcg.  Plan is for pt to be placed in LTC facility Medicaid is pending,    Physical Discharge Disposition: Long Term Care Bed at SNF                                                                          Physical Discharge Disposition: Long Term Care Bed at SNF       Provider Notifications:            Ritta Slot LPN /DCP   [id number]

## 2018-02-14 NOTE — PT Plan of Care Note (Addendum)
[facility]  Department of Rehabilitation Services  [phone]  Jobin Montelongo [place] CSN: [id number]  [facility] [place] [id number]    Physical Therapy General Note  Time: 4    Last seen by a Physical Therapist vs. PTA:     Attempted to see patient for PT tx.    Will hold PT session presently, per advisement of RN, as family/palliative care meeting at 10 am. Will await results of meeting prior to re-attempting session.    Team Communication: RN, Ria Comment    Plan: Will follow up per POC.     Arliss Frisina [employer]

## 2018-02-15 LAB — CBC AND DIFFERENTIAL
Basophils %: 0.2 % (ref 0.0–3.0)
Basophils Absolute: 0 10*3/uL (ref 0.0–0.3)
Eosinophils %: 0.8 % (ref 0.0–7.0)
Eosinophils Absolute: 0.1 10*3/uL (ref 0.0–0.8)
Hematocrit: 29 % — ABNORMAL LOW (ref 39.0–52.5)
Hemoglobin: 9.1 gm/dL — ABNORMAL LOW (ref 13.0–17.5)
Lymphocytes Absolute: 1.3 10*3/uL (ref 0.6–5.1)
Lymphocytes: 9.3 % — ABNORMAL LOW (ref 15.0–46.0)
MCH: 30 pg (ref 28–35)
MCHC: 32 gm/dL (ref 32–36)
MCV: 96 fL (ref 80–100)
MPV: 8.3 fL (ref 6.0–10.0)
Monocytes Absolute: 1.3 10*3/uL (ref 0.1–1.7)
Monocytes: 9.3 % (ref 3.0–15.0)
Neutrophils %: 80.3 % — ABNORMAL HIGH (ref 42.0–78.0)
Neutrophils Absolute: 10.9 10*3/uL — ABNORMAL HIGH (ref 1.7–8.6)
PLT CT: 316 10*3/uL (ref 130–440)
RBC: 3.02 10*6/uL — ABNORMAL LOW (ref 4.00–5.70)
RDW: 11.9 % (ref 11.0–14.0)
WBC: 13.6 10*3/uL — ABNORMAL HIGH (ref 4.0–11.0)

## 2018-02-15 LAB — BASIC METABOLIC PANEL
Anion Gap: 13.9 mMol/L (ref 7.0–18.0)
BUN / Creatinine Ratio: 26.9 Ratio (ref 10.0–30.0)
BUN: 79 mg/dL — ABNORMAL HIGH (ref 7–22)
CO2: 21 mMol/L (ref 20–30)
Calcium: 7.8 mg/dL — ABNORMAL LOW (ref 8.5–10.5)
Chloride: 115 mMol/L — ABNORMAL HIGH (ref 98–110)
Creatinine: 2.94 mg/dL — ABNORMAL HIGH (ref 0.80–1.30)
EGFR: 23 mL/min/{1.73_m2} — ABNORMAL LOW (ref 60–150)
Glucose: 140 mg/dL — ABNORMAL HIGH (ref 71–99)
Osmolality Calculated: 317 mOsm/kg — ABNORMAL HIGH (ref 275–300)
Potassium: 3.9 mMol/L (ref 3.5–5.3)
Sodium: 146 mMol/L (ref 136–147)

## 2018-02-15 LAB — VH DEXTROSE STICK GLUCOSE
Glucose POCT: 160 mg/dL — ABNORMAL HIGH (ref 71–99)
Glucose POCT: 125 mg/dL — ABNORMAL HIGH (ref 71–99)
Glucose POCT: 180 mg/dL — ABNORMAL HIGH (ref 71–99)
Glucose POCT: 201 mg/dL — ABNORMAL HIGH (ref 71–99)

## 2018-02-15 NOTE — Progress Notes (Signed)
02/15/18 1500   Visit Type   Visit Type Initial   Visit Source   Visit Source Ancillary Consult   Present at Visit   Present at Visit Patient   Reason for Request   Reason for Request Ancillary Consult   Ancillary Consult Palliative Care   Spiritual Assessment   Spiritual Assessment Unable to Assess (comment)  (Pt asleep and no one was in room)   Length of Visit   Length of Visit 0-15 minutes   Follow-Up   Follow-up Follow-up important

## 2018-02-15 NOTE — Plan of Care (Addendum)
NURSE NOTE SUMMARY  [facility]   Patient Name: [name]   Attending Physician: Barbaraann Faster, MD   Today's date:   02/15/2018 LOS: 8 days   Shift Summary:                                                              Assumed care of patient around 1900. Patient IV alarm sounding. Patient needing to be changed. Incontinent of bowel. Patient changed and bathed. Bed linens changed as well. Will continue to monitor.   2015: Assessment complete. Patient non-verbal and following minimal commands. Patient does not seem to be in any pain or distress at this time.  Will continue to monitor.   2030: All due medications administered at this time.   2050: Patient flagging for sepsis. RR notified. Tylenol PRN administered. Patient already being treated for known infection. Will continue to monitor.   2200: Rectal temp recheck 101.6  2345: Rectal temp recheck 101.1  0357: Rectal temp recheck 101.3  0600: Patient seemed to rest well this shift. Vitals other than temp have been stable throughout the shift. Patient did not show any signs of pain or distress this shift. Patient was incontinent of bowel and bladder this shift. Patient is currently in bed resting with bed alarm set and call bell within reach. Will continue to monitor.    Provider Notifications:      Rapid Response Notifications:  Mobility:   2052: Patient flagged for sepsis. Rectal temp 101.9, WBC's 16.7, and HR 117. Did SIRS screen and scored 3. No new recommendations per RR.  PMP Activity: Step 3 - Bed Mobility (02/15/2018  3:56 AM)     Weight tracking:  Family Dynamic:   No data found.            Recent Vitals Last Bowel Movement   BP: 152/80 (02/15/2018  3:57 AM)  Heart Rate: 88 (02/15/2018  3:57 AM)  Temp: (!) 100.6 F (38.1 C) (02/15/2018  3:57 AM)  Resp Rate: 20 (02/15/2018  3:57 AM)  SpO2: 98 % (02/15/2018  3:57 AM)   Last BM Date: 02/14/18       Problem: Neurological Deficit  Goal: Neurological status is stable or  improving  Description  Interventions:  1. Monitor/assess/document neurological assessment (Stroke: every 4 hours)  2. Monitor/assess NIH Stroke Scale   3. Re-assess NIH Stroke Scale for any change in status  4. Observe for seizure activity and initiate seizure precautions if indicated  5. Perform CAM Assessment  Outcome: Progressing  Flowsheets (Taken 02/13/2018 2345)  Neurological status is stable or improving: Monitor/assess/document neurological assessment (Stroke: every 4 hours);Perform CAM Assessment     Problem: Impaired Mobility  Goal: Mobility/Activity is maintained at optimal level for patient  Description  Interventions:  1. Increase mobility as tolerated/progressive mobility  2. Encourage independent activity per ability  3. Maintain proper body alignment  4. Perform active/passive ROM  5. Plan activities to conserve energy, plan rest periods  6. Reposition patient every 2 hours and as needed unless able to reposition self  7. Assess for changes in respiratory status, level of consciousness and/or development of fatigue  8. Consult/collaborate with Physical Therapy and/or Occupational Therapy  Outcome: Progressing  Flowsheets (Taken 02/09/2018 0230 by Luna Fuse,  RN)  Mobility/activity is maintained at optimal level for patient: Increase mobility as tolerated/progressive mobility;Perform active/passive ROM;Encourage independent activity per ability;Maintain proper body alignment;Plan activities to conserve energy, plan rest periods;Reposition patient every 2 hours and as needed unless able to reposition self;Assess for changes in respiratory status, level of consciousness and/or development of fatigue;Consult/collaborate with Physical Therapy and/or Occupational Therapy

## 2018-02-15 NOTE — UM Notes (Signed)
CONTINUED STAY REVIEW 02/15/2018    Auth# pending [id number]    Please update UR nurse on status of this authorization. I called intake today and was told the review is pending and that clinicals were received on 1/29. Below is clinical for 02/15/18. Please let me know if you need any additional clinical. Contact information is at bottom of note.      [name]  10/14/1940  MRN: [id number]      Medicine note 2/5:  Assessment and Plan:      Right sided Aspiration PNA on CT--POA  --> had MBSS-- has op mod dysphagia --diet modified  --  MRSA nares negative  -- Influenza A n B -- neg     Patient's aspiration pneumonia initially seem to be getting better however patient took a turn into the weekend.  We cannot as CT repeated shows worsening.  Patient just started having fevers and white cell count kept going up  Vancomycin added to Zosyn  2/4 transitioning to comfort care.. After conversations with palliative care family doesn't want antibiotics as intervention, only those for comfort, discontinued.      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  This is detected on the CT of the neck   CT of chest without contrast    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    Stage 4 CKD  Creatinine uptrending despite fluids  Renal ultrasound showed medical renal disease    Diarrhea -- possibly abx associated --  c diff study negative     Give imodium as needed    Hypertension-- continue with current care   No need  for aggressive management because of comfort care    Diabetesmellitus    Single-vessel CAD --stable --continue plavix and  statin    Acute Bilateral PE diagnosed Dec 2019    Capacity,   -on my evaluation, patient is awake, looks at me when asked questions but just stares, doesn't interact otherwise, doesn't nod yes and no to any questions. At the moment I do not believe he has capacity, but will continue to evaluate if this changes.     DVT POE:UMPN 2/2 CMO    Dispo:    Pending arrangements for comfort care       Recent Labs   Lab 02/15/18  0345   WBC 13.6*   Hemoglobin 9.1*   Hematocrit 29.0*   PLT CT 316        Recent Labs   Lab 02/15/18  0345   Sodium 146   Potassium 3.9   Chloride 115*   CO2 21   BUN 79*   Creatinine 2.94*   EGFR 23*   Glucose 140*   Calcium 7.8*     VITALS:  BP 135/88    Pulse (!) 114    Temp 99.7 F (37.6 C) (Oral)    Resp 18    Ht 1.778 m (5'  10") Comment: 5 ft 10 in   Wt 63.7 kg (140 lb 6.9 oz)    SpO2 96%    BMI 20.15 kg/m      Scheduled Meds:  Current Facility-Administered Medications   Medication Dose Route Frequency    acetaminophen  325 mg Oral TID    amLODIPine  10 mg Oral QAM    finasteride  5 mg Oral QAM    haloperidol  0.5 mg Oral QHS    insulin glargine  12 Units Subcutaneous QHS    insulin lispro (1 Unit Dial)  1-9 Units Subcutaneous TID AC    And    insulin lispro (1 Unit Dial)  1-7 Units Subcutaneous QHS    lactobacillus species  50 Billion CFU Oral Daily    sodium chloride (PF)  3 mL Intravenous Q8H       Gabriela Eves, RN BSN  Utilization Review Nurse  Utilization Management  [facility]  [address]  [place], [place] [address]  Phone: [phone], direct/confidential  Fax: [phone]  [email or web address]

## 2018-02-15 NOTE — Progress Notes (Signed)
Pharmacy Vancomycin Dosing Consult Note  Crit [name]    Assessment:   1. Day # 4 Vancomycin and day # 71 Zosyn for 78 year old DM2 male with aspiration PNA admitted 02/07/18 and found to have tiny subdural hematoma.  CT 2/2 shows development of bilateral moderate-sized pleural effusions. Associated lower lobe atelectasis and probable consolidation/pneumonia on the right. High density material within these regions suggesting aspiration of oral contrast.  2. PMH significant for CAD,diabetes mellitus, hypertension,stage 4 CKDand dementia D stage 4, old stroke, and PE.  3. Leukocytosis improving, afebrile, slightly improved SCr; MRSA/Flu swabs are negative  4. Random level 2/4 @ 1500 =  12.3 mg/L ad vancomycin 750 mg IV was administered at 1800. Pharmacy will continue to dose by levels. Next random level in am on 2/6.      Plan:   1. Vancomycin by levels.  2. Next random level 2/6 at 1000  3. MRSA nares negative  4. Pharmacy will follow the patient's renal function, vancomycin levels, and dosing during the course of therapy. If you have any questions, please contact the pharmacist at [phone].      Indication: PNA  Random level: Pharmacy will redose when level less than 15 mg/L    Age: 78 y.o.  Height: 1.778 m (5\' 10" )  Weight:  63.7 kg (140 lb 6.9 oz)  IBW: 73 kg  DW: 63.7 kg  HD/PD: N/A     Baseline Population Estimate Kinetics:    SCr: 3 mg/dL    CrCl: 18.6 ml/min    Ke: 0.0198 hr-1        t50: 35 hrs        Vd: 44.59L        Expected Trough:  mg/L                  Historic Patient Regimen   Date Regimen Weight SCr CrCl Trough Level Adjustments to Dose              Current Patient Regimen   Date Regimen Trough Date/Time Trough Level Pt Specific Ke   2/2  1000mg  x1      2/3  500 mg x 1   Random 2/4 @ 1500  12.3 mg/L    2/4  750 mg x 1               Cultures   Date Source Organism Sensitivities Resistance                                        qSOFA   Date SBP less than/equal to 100 mm Hg RR greater than/equal  to 22 breaths/min GCS less than/equal to 14 Total                Recent Labs   Lab 02/15/18  0345 02/14/18  0346 02/13/18  0342 02/12/18  0336   Creatinine 2.94* 3.00* 3.09* 2.60*   BUN 79* 74* 68* 57*   WBC 13.6* 16.7* 23.4* 17.4*     Temp (24hrs), Avg:100.7 F (38.2 C), Min:98.8 F (37.1 C), Max:101.9 F (38.8 C)    Vitals:    02/15/18 1115   BP: 140/83   Pulse: (!) 110   Resp: 15   Temp: 98.8 F (37.1 C)   SpO2: 97%

## 2018-02-15 NOTE — Progress Notes (Signed)
[facility]  Follow-up Assessment  Date, Time: 02/15/18 12:07 PM  Patient Name: [name]  Referring Physician:  Alba Cory, DO   Primary Care Physician: Berdine Dance Clarita Crane, MD  Consulting Team: Odis Hollingshead, MD; Filbert Schilder, MD; Eulogio Ditch, PA; Adaline Sill, [place]  Consulting Service: Palliative Medicine  Reason for Follow-up: goals of care, advance care planning, clarification of prognosis and hospice information and/or referral  Palliative care is a specialized, interdisciplinary approach to improving comfort and quality of life at any stage of a serious illness by addressing symptoms, communication, and transitions.       Assessment & Recommendations   Impression / Assessment:  Metabolic encephalopathy  Aspiration pneumonia  Advanced vascular dementia  Acute kidney injury on chronic kidney disease    Recommendations / Plan:  Pain:   Scheduled Tylenol liquid 325 mg 3 times daily  Has Dilaudid 1 mg p.o. every 3 hours as needed pain    Non-pain Symptoms:     Agitation  Continue Haldol 0.5 mg SL q HS  Keep Haldol 0.5 mg SL q 4 hrs for uncontrolled agitation    Other medication recommendations:   Recommend deprescribing the following medications:  IV antibx    Nutrition: comfort feeding as tolerated  Consultants: Hospice and Pastoral care  Goals of care:     The patient has not made much progress with IV antibiotics in terms of mental status and continues to have low grade fever likely from recurrent aspiration. Per our previous discussions with MPOA Ms Kathlen Mody, the goals of care will continue to be focussed solely on his comfort . We will discontinue all non beneficial treatments at this time including IV antibx. Continue comfort feeding as much as tolerated with proper aspiration precautions.    Hospice has been consulted and will provide guidance on discharge plans. Currently he has a Medicaid application in place to allow for LTC  placement. Should he develop unmanageable symptoms, he may be a candidate for [facility].    The patient/family wants highest priority given to comfort, as opposed to independence (function) or longevity.    Advance Directives:    Has Advance Directives: [x]  Yes []  No    Discussed: []  Yes [x]  No           Current CPR Status:  NO CPR  -  ALLOW NATURAL DEATH     Disposition / Further attention / Follow-up:     Continue to follow as needed for symptom management    Discussed with: Dr Allayne Gitelman, CM Tamika; RN    Please do not hesitate to contact the Palliative Care Service if you have questions about the above recommendations.     Filbert Schilder, MD    Palliative Care Service, [facility] [place], [facility]  [place], [place] [address]            Interval History   All interval notes reviewed.   Med list reviewed.     CC: alert but not following commands consistently    only able to grunt in response to questions about dyspnea and pain; speech unintelligible    Pain: seems improved  Dyspnea: none  N/V: resolved  Constipation/Diarrhea: loose bowel movements on/off  Anxiety/Depression: none  Other Distress: none    Medications     Medications:       Current Facility-Administered Medications   Medication Dose Route Frequency    acetaminophen  325 mg Oral TID  amLODIPine  10 mg Oral QAM    finasteride  5 mg Oral QAM    haloperidol  0.5 mg Oral QHS    insulin glargine  12 Units Subcutaneous QHS    insulin lispro (1 Unit Dial)  1-9 Units Subcutaneous TID AC    And    insulin lispro (1 Unit Dial)  1-7 Units Subcutaneous QHS    lactobacillus species  50 Billion CFU Oral Daily    piperacillin-tazobactam  2.25 g Intravenous Q8H    sodium chloride (PF)  3 mL Intravenous Q8H    vancomycin therapy placeholder   Does not apply See Admin Instructions       acetaminophen, baclofen, dextrose, glucagon (rDNA), haloperidol, hydrALAZINE, HYDROmorphone, NSG Communication: Glucose POCT order (AC, HS) **AND** insulin lispro (1  Unit Dial) **AND** insulin lispro (1 Unit Dial) **AND** insulin lispro (1 Unit Dial), loperamide, naloxone, ondansetron, promethazine    Allergies     No Known Allergies    Physical Exam     BP 140/83    Pulse (!) 110    Temp 98.8 F (37.1 C) (Oral)    Resp 15    Ht 1.778 m (5\' 10" ) Comment: 5 ft 10 in   Wt 63.7 kg (140 lb 6.9 oz)    SpO2 97%    BMI 20.15 kg/m     Physical Exam   Constitutional: No distress.   arousable but does not follow commands   Eyes: No scleral icterus.   Cardiovascular: Normal rate, regular rhythm and normal heart sounds.   No murmur heard.  Pulmonary/Chest: Effort normal. No respiratory distress. He has rales.   Abdominal: Soft. Bowel sounds are normal. He exhibits no distension. There is no abdominal tenderness.   Neurological: He is alert.   Grunts in response to questions only; no intelligible speech   Vitals reviewed.    Labs / Radiology     Lab and diagnostics: reviewed by me.  Recent Labs   Lab 02/15/18  0345   WBC 13.6*   Hemoglobin 9.1*   Hematocrit 29.0*   PLT CT 316     Recent Labs   Lab 02/15/18  0345   Sodium 146   Potassium 3.9   Chloride 115*   CO2 21   BUN 79*   Creatinine 2.94*   EGFR 23*   Glucose 140*   Calcium 7.8*     Recent Labs   Lab 02/14/18  0346   Bilirubin, Total 0.4   Protein, Total 5.4*   Albumin 2.0*   ALT 72*   AST (SGOT) 62*     No results found.    Eval / Mgmt / Counseling Time     Evaluation and mgmt spent on palliative care concepts, end of life symptom management and discharge planning issues.  L2

## 2018-02-15 NOTE — Progress Notes (Addendum)
Medicine Progress Note   [facility]   Patient Name: [name], Brandon LOS: 8 days   Attending Physician: Alba Cory, DO PCP: Melvyn Neth, MD      Hospital Course:                                                            Hendricks Milo Junior Wesely is a 78 y.o. male Ms. past medical history old stroke, CAD,diabetes mellitus, hypertension,stage 4 CKD and dementia who came in for extended for and skin laceration.  Patient was recently dced on plavix and eliquis . Eliquis was added on last admit bc of PE and DVT. Patient has dementia patient cannot provide any medical information history obtained from the ER physician and the chart.  Patient was found to have possible tiny subdural hematoma.  Seen by neurosurgical service, recommend observation overnight and repeat a CT scan of the head if there is no hematoma patient can be discharged.  initially held  Plavix/eliquis.  The scalp laceration been sutured. Repeat CT showed no evidence of bleed. Meds resumed. Placement pending as family states they cannot take care of him at home.    Has persistent leukocytosis likely from aspiration PNA.  Cr range seems WNL as per his baseline ckd 4.    Salted palliative care because of the re-escalating issues and poor prognosis.  Family seems to be agreeable transitioning to comfort or hospice type care. After speaking with Dr. Dorthy Cooler it turns out family doesn't want IV antibiotics continued, wants only those measures that provide comfort, discontinuing antibiotics.          Assessment and Plan:      Right sided Aspiration PNA on CT--POA  --> had MBSS-- has op mod dysphagia --diet modified  --  MRSA nares negative  -- Influenza A n B -- neg     Patient's aspiration pneumonia initially seem to be getting better however patient took a turn into the weekend.  We cannot as CT repeated shows worsening.  Patient just started having fevers and white cell count kept going up  Vancomycin  added to Zosyn  2/4 transitioning to comfort care.. After conversations with palliative care family doesn't want antibiotics as intervention, only those for comfort, discontinued.      Mod OP dysphagia -- likely from demential on hx of CVA.Marland Kitchen diet adjusted per SLP ( had MBSS)    Demential liklely senile and vascular -- chronic     Possible tiny acute subdural hematoma on the left, without mass effect  Due to accidental fall  No neuro deficit  Neurosurgery consulted --recommend repeat a CT scan if CT scan no increase of hematoma patient can be discharged  Control blood pressure  Transitioning to comfort care    Scalp laceration due to accidental fall  S/P suture  CT of the neck neck did not show to fracture or dislocation    Esophageal foreign body possible food or mass ??  This is detected on the CT of the neck   CT of chest without contrast    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    Stage 4 CKD  Creatinine uptrending despite fluids  Renal ultrasound showed medical renal disease    Diarrhea --  possibly abx associated --  c diff study negative     Give imodium as needed    Hypertension -- continue with current care   No need for aggressive management because of comfort care    Diabetesmellitus    Single-vessel CAD --stable --continue plavix and  statin     Acute Bilateral PE diagnosed Dec 2019    Capacity,   -on my evaluation, patient is awake, looks at me when asked questions but just stares, doesn't interact otherwise, doesn't nod yes and no to any questions. At the moment I do not believe he has capacity, but will continue to evaluate if this changes.     DVT PPx: held 2/2 CMO    Dispo:     Pending arrangements for comfort care    Code:  DNR ( made with poa 2/1)     Subjective     Awake, doesn't talk, just stares at me, otherwise doesn't interact      Objective   Physical Exam:     Vitals: T:98.8 F (37.1 C) (Oral), BP:140/83, HR:(!) 110, RR:15,  SaO2:97%    General: Patient is hypoactive .  HEENT: No conjunctival drainage, vision is intact, anicteric sclera.  Neck: Supple, no thyromegaly.  Chest: mild rhonchi b/l  . dminished at bases .Marland Kitchen no wheezing. No use of accessory muscles.  CVS: Normal rate and regular rhythm no murmurs, without JVD, no pitting edema, pulses palpable.  Abdomen: Soft, non-tender, no guarding or rigidity, with normal bowel sounds.  Extremities: No calf swelling and no gross deformity.  Skin: Warm, dry, no rash and no worrisome lesions.  NEURO: No motor or sensory deficits.  Psychiatric: Alert, less interactive,   Weight Monitoring 11/28/2017 12/29/2017 12/29/2017 12/31/2017 01/01/2018 01/17/2018 02/02/2018   Height 167.6 cm - 167.6 cm - - 177.8 cm 177.8 cm   Weight 66.679 kg 64 kg 64 kg 63.2 kg 62.1 kg 64.91 kg 63.7 kg   Weight Method - Actual - Standing Scale Standing Scale - Bed Scale   BMI (calculated) 23.8 kg/m2 - 22.8 kg/m2 - - 20.6 kg/m2 20.2 kg/m2         Intake/Output Summary (Last 24 hours) at 02/15/2018 1430  Last data filed at 02/15/2018 1200  Gross per 24 hour   Intake 720 ml   Output    Net 720 ml     Body mass index is 20.15 kg/m.     Meds:     Current Facility-Administered Medications   Medication Dose Route Frequency    acetaminophen  325 mg Oral TID    amLODIPine  10 mg Oral QAM    finasteride  5 mg Oral QAM    haloperidol  0.5 mg Oral QHS    insulin glargine  12 Units Subcutaneous QHS    insulin lispro (1 Unit Dial)  1-9 Units Subcutaneous TID AC    And    insulin lispro (1 Unit Dial)  1-7 Units Subcutaneous QHS    lactobacillus species  50 Billion CFU Oral Daily    sodium chloride (PF)  3 mL Intravenous Q8H       PRN Meds: acetaminophen, baclofen, dextrose, glucagon (rDNA), haloperidol, hydrALAZINE, HYDROmorphone, NSG Communication: Glucose POCT order (AC, HS) **AND** insulin lispro (1 Unit Dial) **AND** insulin lispro (1 Unit Dial) **AND** insulin lispro (1 Unit Dial), loperamide, naloxone, ondansetron,  promethazine.     LABS:     Estimated Creatinine Clearance: 19 mL/min (A) (based on SCr of 2.94 mg/dL (H)).  Recent  Labs   Lab 02/15/18  0345 02/14/18  0346   WBC 13.6* 16.7*   RBC 3.02* 3.33*   Hemoglobin 9.1* 10.2*   Hematocrit 29.0* 32.2*   MCV 96 97   PLT CT 316 330             Lab Results   Component Value Date    HGBA1CPERCNT 5.7 01/11/2018     Recent Labs   Lab 02/15/18  0345 02/14/18  0346 02/13/18  0342   Glucose 140* 84 202*   Sodium 146 144 140   Potassium 3.9 3.9 4.8   Chloride 115* 111* 111*   CO2 21 22 17*   BUN 79* 74* 68*   Creatinine 2.94* 3.00* 3.09*   EGFR 23* 22* 21*   Calcium 7.8* 8.4* 8.6     Recent Labs   Lab 02/14/18  0346 02/12/18  0336   Albumin 2.0* 2.1*   Protein, Total 5.4* 5.4*   Bilirubin, Total 0.4 0.4   Alkaline Phosphatase 58 59   ALT 72* 66*   AST (SGOT) 62* 41           Invalid input(s):  AMORPHOUSUA   Patient Lines/Drains/Airways Status    Active PICC Line / CVC Line / PIV Line / Drain / Airway / Intraosseous Line / Epidural Line / ART Line / Line / Wound / Pressure Ulcer / NG/OG Tube     Name:   Placement date:   Placement time:   Site:   Days:    Peripheral IV 02/04/18 Anterior;Left Upper Arm   02/04/18    1610    Upper Arm   less than 1               Ct Chest Wo Contrast    Result Date: 02/12/2018  Development of bilateral moderate-sized pleural effusions. Associated lower lobe atelectasis and probable consolidation/pneumonia on the right. High density material within these regions,, suggesting aspiration of oral contrast. No distention or gross abnormality identified to the esophagus. ReadingStation:[id number]    Xr Chest Ap Portable    Result Date: 02/11/2018  Hypoinflated lungs with bronchovascular crowding and bibasilar atelectasis. No definite new focal consolidation to suggest pneumonia. [place], DO     02/15/18,2:30 PM   MRN: [id number]                                      CSN: [id number] DOB: May 25, 1940

## 2018-02-15 NOTE — Progress Notes (Signed)
[facility] [place]   Patient Name: [name]   Attending Physician: Alba Cory, DO   Today's date:   02/15/2018 LOS: 8 days   Expected Discharge Date Expected Discharge Date: (pending placement)    Quick  Assessment:                                                              ReAdmit Risk Score: 24    CM Comments: DCP 02/15/2018: Pt adm s/p fall, subdural hematoma.  PTA lived with niece as pcg.  Plan is for pt to be placed in LTC facility with Hospice, but needs payer source Medicaid is pending, [facility] also evaluating patient for the [facility]    Physical Discharge Disposition: Long Term Care Bed at [facility]                                                                          Physical Discharge Disposition: Long Term Care Bed at [facility]       Provider Notifications:          Ritta Slot LPN /DCP   [id number]

## 2018-02-15 NOTE — Plan of Care (Signed)
Problem: Moderate/High Fall Risk Score >5  Goal: Patient will remain free of falls  Outcome: Progressing     Problem: Compromised Tissue integrity  Goal: Damaged tissue is healing and protected  Flowsheets (Taken 02/15/2018 1846)  Damaged tissue is healing and protected : Monitor/assess Braden scale every shift; Reposition patient every 2 hours and as needed unless able to reposition self; Increase activity as tolerated/progressive mobility; Relieve pressure to bony prominences for patients at moderate and high risk; Use incontinence wipes for cleaning urine, stool and caustic drainage. Foley care as needed

## 2018-02-15 NOTE — Consults (Signed)
Attempted to make visit but pt was asleep and there was no one else in the room.  Pastoral Care will continue to make visits and offer support.

## 2018-02-16 ENCOUNTER — Ambulatory Visit: Payer: Medicare Other | Admitting: Family Medicine

## 2018-02-16 NOTE — Plan of Care (Signed)
Problem: Psychosocial and Spiritual Needs  Goal: Demonstrates ability to cope with hospitalization/illness  Outcome: Progressing     Problem: Pain interferes with ability to perform ADL  Goal: Pain at adequate level as identified by patient  Outcome: Progressing     Problem: Side Effects from Pain Analgesia  Goal: Patient will experience minimal side effects of analgesic therapy  Outcome: Progressing     Problem: Dyspnea  Goal: Dyspnea is at a manageable effort  Outcome: Progressing

## 2018-02-16 NOTE — Progress Notes (Signed)
02/16/18 1500   Visit Type   Visit Type Follow-up   Visit Source   Visit Source Chaplain Initiated   Present at Visit   Present at Visit Patient   Spiritual Assessment   Spiritual Assessment Unable to Assess (comment)  (pt asleep, no family present)   Spiritual Care Outcomes   Consulted With Nurse   Length of Visit   Length of Visit 0-15 minutes   Follow-Up   Follow-up Follow-up important

## 2018-02-16 NOTE — Plan of Care (Addendum)
NURSE NOTE SUMMARY  [facility] [place]   Patient Name: [name]   Attending Physician: Alba Cory, DO   Today's date:   02/16/2018 LOS: 9 days   Shift Summary:                                                              Assumed care of patient around 1900. Patient in bed resting with eyes closed. Patient not showing any signs of pain or distress. All safety checks complete. Bed alarm set and call bell within reach. Will continue to monitor.   1945: Assessment complete. Patient non-verbal but responds to name. Patient also only following minimal commands. Patient seems to be comfortable. Will continue to monitor.   0600: Uneventful shift. Patient took all medications by mouth this shift. Patient seemed to rest well this shift. Patient did not show any signs of pain or distress. Patient was incontinent of bowel and bladder this shift. Patient is currently in bed resting with bed alarm set and call bell within reach. Will continue to monitor.    Provider Notifications:      Rapid Response Notifications:  Mobility:      PMP Activity: Step 3 - Bed Mobility (02/16/2018  2:30 AM)     Weight tracking:  Family Dynamic:   No data found.            Recent Vitals Last Bowel Movement   BP: 135/88 (02/15/2018  3:07 PM)  Heart Rate: (!) 114 (02/15/2018  3:07 PM)  Temp: 99.7 F (37.6 C) (02/15/2018  3:07 PM)  Resp Rate: 18 (02/15/2018  3:07 PM)  SpO2: 96 % (02/15/2018  3:07 PM)   Last BM Date: 02/15/18       Problem: Compromised Tissue integrity  Goal: Damaged tissue is healing and protected  Description  Interventions:  1. Monitor/assess Braden scale every shift  2. Provide wound care per wound care algorithm  3. Reposition patient every 2 hours and as needed unless able to reposition self  4. Increase activity as tolerated/progressive mobility  5. Relieve pressure to bony prominences for patients at moderate and high risk  6. Avoid shearing injuries   7. Keep intact skin clean and  dry  8. Use bath wipes, not soap and water, for daily bathing   9. Use incontinence wipes for cleaning urine, stool and caustic drainage; Foley care as needed   10. Monitor external devices/tubes for correct placement to prevent pressure, friction and shearing   11. Encourage use of lotion/moisturizer on skin  12. Monitor patient's hygiene practices  13. Consult/collaborate with wound care nurse   14. Utilize specialty bed  15. Consider placing an indwelling catheter if incontinence interferes with healing of stage 3 or 4 pressure injury  Outcome: Progressing  Flowsheets (Taken 02/15/2018 1846 by Duffy, Sandria Manly, RN)  Damaged tissue is healing and protected : Monitor/assess Braden scale every shift;Reposition patient every 2 hours and as needed unless able to reposition self;Increase activity as tolerated/progressive mobility;Relieve pressure to bony prominences for patients at moderate and high risk;Use incontinence wipes for cleaning urine, stool and caustic drainage. Foley care as needed     Problem: Pain interferes with ability to perform ADL  Goal: Pain at adequate level as identified by  patient  Description  Interventions:  1. Identify patient comfort function goal  2. Evaluate if patient comfort function goal is met  3. Assess pain on admission, during daily assessment and/or before any "as needed" intervention(s)  4. Reassess pain within 30-60 minutes of any procedure/intervention, per Pain Assessment, Intervention, Reassessment (AIR) Cycle  5. Evaluate patient's satisfaction with pain management progress  6. Offer non-pharmacological pain management interventions  7. Consult/collaborate with Pain Service  8. Consult/collaborate with Physical Therapy, Occupational Therapy, and/or Speech Therapy  9. Assess for risk of opioid induced respiratory depression and side effects, including snoring/sleep apnea. Alert healthcare team of risk factors identified.  10. Include patient/patient care companion in decisions  related to pain management as needed  Outcome: Progressing  Flowsheets (Taken 02/16/2018 0232)  Pain at adequate level as identified by patient: Identify patient comfort function goal; Assess for risk of opioid induced respiratory depression, including snoring/sleep apnea. Alert healthcare team of risk factors identified.; Assess pain on admission, during daily assessment and/or before any "as needed" intervention(s); Reassess pain within 30-60 minutes of any procedure/intervention, per Pain Assessment, Intervention, Reassessment (AIR) Cycle; Evaluate if patient comfort function goal is met; Evaluate patient's satisfaction with pain management progress; Offer non-pharmacological pain management interventions; Consult/collaborate with Pain Service; Consult/collaborate with Physical Therapy, Occupational Therapy, and/or Speech Therapy; Include patient/patient care companion in decisions related to pain management as needed

## 2018-02-16 NOTE — Consults (Signed)
[facility] informational done with the pt's niece/Poa Jaqueline. She is requesting [facility] services. Dr. Jason Nest, Medical Dir of [facility] states the pt is medically appropriate for [facility] at a facility. DCP, floor nurse and niece notified. Will wait for placement.

## 2018-02-16 NOTE — OT Plan of Care Note (Signed)
[place] [id number]  Department of Rehabilitation Services  [phone]    Gwin Eagon Piggott CSN: [id number]  [facility] [place] [id number]    Occupational Therapy General Note  Time: 1300    Attempted to see patient to follow up for plan of care. However, per chart review and report from RN, patient transitioning to inpatient comfort care. Patient with poor prognosis, limited rehabilitation potential, and with limited carryover for functional activities. Patient nonverbal and history of dementia. Patient being discharged from acute care OT services secondary to transitioning to comfort care    Team communication: RN - [place]: Will discharge patient from acute care OT services.      [place] Mikhayla Phillis, OTR/L

## 2018-02-16 NOTE — Progress Notes (Signed)
NURSE NOTE SUMMARY  [facility] [place]   Patient Name: Wardell Heath   Attending Physician: Alba Cory, DO   Today's date:   02/16/2018 LOS: 9 days   Shift Summary:                                                              Pt sleepy this shift, ate 100% of each meal. incont of urine. Pt calls out occassionally in pain, medicated w/ prn meds appropriately.    Provider Notifications:      Rapid Response Notifications:  Mobility:      PMP Activity: Step 1 - Bedrest (02/16/2018  8:00 AM)     Weight tracking:  Family Dynamic:   No data found.          Recent Vitals Last Bowel Movement   BP: 132/78 (02/16/2018  9:01 AM)  Heart Rate: 91 (02/16/2018  7:34 AM)  Temp: 99.3 F (37.4 C) (02/16/2018  7:39 AM)  Resp Rate: 20 (02/16/2018  7:34 AM)  SpO2: 98 % (02/16/2018  7:34 AM)   Last BM Date: 02/15/18

## 2018-02-16 NOTE — Progress Notes (Signed)
Medicine Progress Note   [facility]   Patient Name: [name], [name] LOS: 9 days   Attending Physician: Alba Cory, DO PCP: Melvyn Neth, MD      Hospital Course:                                                            [name] is a 78 y.o. male Ms. past medical history old stroke, CAD,diabetes mellitus, hypertension,stage 4 CKD and dementia who came in for extended for and skin laceration.  Patient was recently dced on plavix and eliquis . Eliquis was added on last admit bc of PE and DVT. Patient has dementia patient cannot provide any medical information history obtained from the ER physician and the chart.  Patient was found to have possible tiny subdural hematoma.  Seen by neurosurgical service, recommend observation overnight and repeat a CT scan of the head if there is no hematoma patient can be discharged.  initially held  Plavix/eliquis.  The scalp laceration been sutured. Repeat CT showed no evidence of bleed. Meds resumed. Placement pending as family states they cannot take care of him at home.    Has persistent leukocytosis likely from aspiration PNA.  Cr range seems WNL as per his baseline ckd 4.    Salted palliative care because of the re-escalating issues and poor prognosis.  Family seems to be agreeable transitioning to comfort or hospice type care. After speaking with Dr. Dorthy Cooler it turns out family doesn't want IV antibiotics continued, wants only those measures that provide comfort, discontinuing antibiotics.          Assessment and Plan:      Right sided Aspiration PNA on CT--POA  --> had MBSS-- has op mod dysphagia --diet modified  --  MRSA nares negative  -- Influenza A n B -- neg     Patient's aspiration pneumonia initially seem to be getting better however patient took a turn into the weekend.  We cannot as CT repeated shows worsening.  Patient just started having fevers and white cell count kept going up  Vancomycin  added to Zosyn  2/4 transitioning to comfort care.. After conversations with palliative care family doesn't want antibiotics as intervention, only those for comfort, discontinued.      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  This is detected on the CT of the neck   CT of chest without contrast    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    Stage 4 CKD  Creatinine uptrending despite fluids  Renal ultrasound showed medical renal disease    Diarrhea -- possibly abx associated --  c diff study negative     Give  imodium as needed    Hypertension -- continue with current care   No need for aggressive management because of comfort care    Diabetesmellitus    Single-vessel CAD --stable --continue plavix and  statin     Acute Bilateral PE diagnosed Dec 2019    Capacity,   -patient grunting in response to questions, otherwise patient AO x 0, spoke to RN this is normal for patient, no change.   DVT PPx: held 2/2 CMO    Dispo:     Pending arrangements for comfort care    Code:  DNR ( made with poa 2/1)     Subjective     Grunted in response to questions.       Objective   Physical Exam:     Vitals: T:99.3 F (37.4 C) (Oral), BP:159/86, HR:91, RR:20, SaO2:98%    General: Patient is hypoactive . Grunts in response to questions  HEENT: No conjunctival drainage, vision is intact, anicteric sclera.  Neck: Supple, no thyromegaly.  Chest: mild rhonchi b/l  . dminished at bases .Marland Kitchen no wheezing. No use of  accessory muscles.  CVS: Normal rate and regular rhythm no murmurs, without JVD, no pitting edema, pulses palpable.  Abdomen: Soft, non-tender, no guarding or rigidity, with normal bowel sounds.  Extremities: No calf swelling and no gross deformity.  Skin: Warm, dry, no rash and no worrisome lesions.  NEURO: No motor or sensory deficits.  Psychiatric: Alert, looks at me, grunts, but otherwise not interactive   Weight Monitoring 11/28/2017 12/29/2017 12/29/2017 12/31/2017 01/01/2018 01/17/2018 01/16/2018   Height 167.6 cm - 167.6 cm - - 177.8 cm 177.8 cm   Weight 66.679 kg 64 kg 64 kg 63.2 kg 62.1 kg 64.91 kg 63.7 kg   Weight Method - Actual - Standing Scale Standing Scale - Bed Scale   BMI (calculated) 23.8 kg/m2 - 22.8 kg/m2 - - 20.6 kg/m2 20.2 kg/m2         Intake/Output Summary (Last 24 hours) at 02/16/2018 0858  Last data filed at 02/15/2018 1200  Gross per 24 hour   Intake 240 ml   Output    Net 240 ml     Body mass index is 20.15 kg/m.     Meds:     Current Facility-Administered Medications   Medication Dose Route Frequency    acetaminophen  325 mg Oral TID    amLODIPine  10 mg Oral QAM    finasteride  5 mg Oral QAM    haloperidol  0.5 mg Oral QHS    sodium chloride (PF)  3 mL Intravenous Q8H       PRN Meds: acetaminophen, baclofen, haloperidol, HYDROmorphone, loperamide, naloxone, ondansetron, promethazine.     LABS:     Estimated Creatinine Clearance: 19 mL/min (A) (based on SCr of 2.94 mg/dL (H)).  Recent Labs   Lab 02/15/18  0345 02/14/18  0346   WBC 13.6* 16.7*   RBC 3.02* 3.33*   Hemoglobin 9.1* 10.2*   Hematocrit 29.0* 32.2*   MCV 96 97   PLT CT 316 330             Lab Results   Component Value Date    HGBA1CPERCNT 5.7 01/11/2018     Recent Labs   Lab 02/15/18  0345 02/14/18  0346 02/13/18  0342   Glucose 140* 84 202*   Sodium 146 144 140   Potassium 3.9 3.9 4.8   Chloride 115* 111* 111*   CO2 21 22 17*  BUN 79* 74* 68*   Creatinine 2.94* 3.00* 3.09*   EGFR 23* 22* 21*   Calcium 7.8* 8.4* 8.6      Recent Labs   Lab 02/14/18  0346 02/12/18  0336   Albumin 2.0* 2.1*   Protein, Total 5.4* 5.4*   Bilirubin, Total 0.4 0.4   Alkaline Phosphatase 58 59   ALT 72* 66*   AST (SGOT) 62* 41           Invalid input(s):  AMORPHOUSUA   Patient Lines/Drains/Airways Status    Active PICC Line / CVC Line / PIV Line / Drain / Airway / Intraosseous Line / Epidural Line / ART Line / Line / Wound / Pressure Ulcer / NG/OG Tube     Name:   Placement date:   Placement time:   Site:   Days:    Peripheral IV 02/04/18 Anterior;Left Upper Arm   02/04/18    1610    Upper Arm   less than 1               Ct Chest Wo Contrast    Result Date: 02/12/2018  Development of bilateral moderate-sized pleural effusions. Associated lower lobe atelectasis and probable consolidation/pneumonia on the right. High density material within these regions,, suggesting aspiration of oral contrast. No distention or gross abnormality identified to the esophagus. ReadingStation:[id number]    Xr Chest Ap Portable    Result Date: 02/11/2018  Hypoinflated lungs with bronchovascular crowding and bibasilar atelectasis. No definite new focal consolidation to suggest pneumonia. [place], DO     02/16/18,8:58 AM   MRN: [id number]                                      CSN: [id number] DOB: Jun 14, 1940

## 2018-02-17 NOTE — Plan of Care (Addendum)
NURSE NOTE SUMMARY  [facility] - [facility] [place]   Patient Name: Wardell Heath   Attending Physician: Alba Cory, DO   Today's date:   02/17/2018 LOS: 10 days   Shift Summary:                                                              Pt had uneventful shift. Resting well, tolerating meds. Medicated with prn Dilaudid. Repositioned. Will continue to monitor.    Provider Notifications:      Rapid Response Notifications:  Mobility:      PMP Activity: Step 3 - Bed Mobility (02/16/2018  7:37 PM)     Weight tracking:  Family Dynamic:   No data found.          Recent Vitals Last Bowel Movement   BP: 132/78 (02/16/2018  9:01 AM)  Heart Rate: 91 (02/16/2018  7:34 AM)  Temp: 99.3 F (37.4 C) (02/16/2018  7:39 AM)  Resp Rate: 20 (02/16/2018  7:34 AM)  SpO2: 98 % (02/16/2018  7:34 AM)   Last BM Date: 02/15/18       Problem: Moderate/High Fall Risk Score >5  Goal: Patient will remain free of falls  Outcome: Progressing  Flowsheets (Taken 02/16/2018 1937)  VH High Risk (Greater than 13): ALL REQUIRED LOW INTERVENTIONS;ALL REQUIRED MODERATE INTERVENTIONS;RED "HIGH FALL RISK" SIGNAGE;BED ALARM WILL BE ACTIVATED WHEN THE PATEINT IS IN BED WITH SIGNAGE "RESET BED ALARM";A CHAIR PAD ALARM WILL BE USED WHEN PATIENT IS UP SITTING IN A CHAIR;PATIENT IS TO BE SUPERVISED FOR ALL TOILETING ACTIVITIES;Use chair-pad alarm device;Use assistive devices     Problem: Compromised Tissue integrity  Goal: Damaged tissue is healing and protected  Outcome: Progressing  Flowsheets (Taken 02/17/2018 0329)  Damaged tissue is healing and protected : Monitor/assess Braden scale every shift; Provide wound care per wound care algorithm; Reposition patient every 2 hours and as needed unless able to reposition self; Increase activity as tolerated/progressive mobility; Relieve pressure to bony prominences for patients at moderate and high risk; Avoid shearing injuries; Keep intact skin clean and dry; Use bath wipes, not soap and water,  for daily bathing; Use incontinence wipes for cleaning urine, stool and caustic drainage. Foley care as needed; Monitor external devices/tubes for correct placement to prevent pressure, friction and shearing; Monitor patient's hygiene practices; Encourage use of lotion/moisturizer on skin; Consult/collaborate with wound care nurse; Utilize specialty bed; Consider placing an indwelling catheter if incontinence interferes with healing of stage 3 or 4 pressure injury     Problem: Impaired Mobility  Goal: Mobility/Activity is maintained at optimal level for patient  Outcome: Progressing  Flowsheets (Taken 02/09/2018 0230)  Mobility/activity is maintained at optimal level for patient: Increase mobility as tolerated/progressive mobility;Perform active/passive ROM;Encourage independent activity per ability;Maintain proper body alignment;Plan activities to conserve energy, plan rest periods;Reposition patient every 2 hours and as needed unless able to reposition self;Assess for changes in respiratory status, level of consciousness and/or development of fatigue;Consult/collaborate with Physical Therapy and/or Occupational Therapy     Problem: Psychosocial and Spiritual Needs  Goal: Demonstrates ability to cope with hospitalization/illness  Outcome: Progressing  Flowsheets (Taken 02/17/2018 0329)  Demonstrates ability to cope with hospitalization/illness (Palliative Care): Provide quiet environment; Consult/collaborate with pastoral/spiritual care, social services, mental health counselor as  needed; Assist patient to identify own strengths and abilities

## 2018-02-17 NOTE — Progress Notes (Signed)
Medicine Progress Note   [facility]   Patient Name: [name], [name] LOS: 10 days   Attending Physician: Alba Cory, DO PCP: Melvyn Neth, MD      Hospital Course:                                                            Hendricks Milo Junior Hoffman is a 77 y.o. male Ms. past medical history old stroke, CAD,diabetes mellitus, hypertension,stage 4 CKD and dementia who came in for extended for and skin laceration.  Patient was recently dced on plavix and eliquis . Eliquis was added on last admit bc of PE and DVT. Patient has dementia patient cannot provide any medical information history obtained from the ER physician and the chart.  Patient was found to have possible tiny subdural hematoma.  Seen by neurosurgical service, recommend observation overnight and repeat a CT scan of the head if there is no hematoma patient can be discharged.  initially held  Plavix/eliquis.  The scalp laceration been sutured. Repeat CT showed no evidence of bleed. Meds resumed. Placement pending as family states they cannot take care of him at home.    Has persistent leukocytosis likely from aspiration PNA.  Cr range seems WNL as per his baseline ckd 4.    Salted palliative care because of the re-escalating issues and poor prognosis.  Family seems to be agreeable transitioning to comfort or hospice type care. After speaking with Dr. Dorthy Cooler it turns out family doesn't want IV antibiotics continued, wants only those measures that provide comfort, discontinuing antibiotics. Currently awaiting placement for hospice.          Assessment and Plan:      Right sided Aspiration PNA on CT--POA  --> had MBSS-- has op mod dysphagia --diet modified  --  MRSA nares negative  -- Influenza A n B -- neg     Patient's aspiration pneumonia initially seem to be getting better however patient took a turn into the weekend.  We cannot as CT repeated shows worsening.  Patient just started having fevers and  white cell count kept going up  Vancomycin added to Zosyn  2/4 transitioning to comfort care.. After conversations with palliative care family doesn't want antibiotics as intervention, only those for comfort, discontinued.      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  This is detected on the CT of the neck   CT of chest without contrast    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    Stage 4 CKD  Creatinine uptrending despite fluids  Renal ultrasound showed medical renal disease    Diarrhea -- possibly abx associated --  c diff study negative  Give imodium as needed    Hypertension -- continue with current care   No need for aggressive management because of comfort care    Diabetesmellitus    Single-vessel CAD --stable --continue plavix and  statin     Acute Bilateral PE diagnosed Dec 2019    Capacity,   -patient grunting in response to questions, otherwise patient AO x 0, spoke to RN this is normal for patient, no change.   DVT PPx: held 2/2 CMO    Dispo:     Pending arrangements for comfort care    Code:  DNR ( made with poa 2/1)     Subjective   Looked at me but otherwise didn't interact.       Objective   Physical Exam:     Vitals: T:100.4 F (38 C) (Oral), BP:144/72, HR:(!) 110, RR:14, SaO2:97%    General: Patient is hypoactive . Grunts in response to questions  HEENT: No conjunctival drainage, vision is intact, anicteric sclera.  Neck: Supple, no thyromegaly.  Chest: mild rhonchi  b/l  . dminished at bases .Marland Kitchen no wheezing. No use of accessory muscles.  CVS: Normal rate and regular rhythm no murmurs, without JVD, no pitting edema, pulses palpable.  Abdomen: Soft, non-tender, no guarding or rigidity, with normal bowel sounds.  Extremities: No calf swelling and no gross deformity.  Skin: Warm, dry, no rash and no worrisome lesions.  NEURO: No motor or sensory deficits.  Psychiatric: Alert, looks at me, grunts, but otherwise not interactive   Weight Monitoring 11/28/2017 12/29/2017 12/29/2017 12/31/2017 01/01/2018 01/17/2018 01/15/2018   Height 167.6 cm - 167.6 cm - - 177.8 cm 177.8 cm   Weight 66.679 kg 64 kg 64 kg 63.2 kg 62.1 kg 64.91 kg 63.7 kg   Weight Method - Actual - Standing Scale Standing Scale - Bed Scale   BMI (calculated) 23.8 kg/m2 - 22.8 kg/m2 - - 20.6 kg/m2 20.2 kg/m2         Intake/Output Summary (Last 24 hours) at 02/17/2018 1136  Last data filed at 02/17/2018 0909  Gross per 24 hour   Intake 1280 ml   Output    Net 1280 ml     Body mass index is 20.15 kg/m.     Meds:     Current Facility-Administered Medications   Medication Dose Route Frequency    acetaminophen  325 mg Oral TID    amLODIPine  10 mg Oral QAM    finasteride  5 mg Oral QAM    haloperidol  0.5 mg Oral QHS    sodium chloride (PF)  3 mL Intravenous Q8H       PRN Meds: acetaminophen, baclofen, haloperidol, HYDROmorphone, loperamide, naloxone, ondansetron, promethazine.     LABS:     Estimated Creatinine Clearance: 19 mL/min (A) (based on SCr of 2.94 mg/dL (H)).  Recent Labs   Lab 02/15/18  0345 02/14/18  0346   WBC 13.6* 16.7*   RBC 3.02* 3.33*   Hemoglobin 9.1* 10.2*   Hematocrit 29.0* 32.2*   MCV 96 97   PLT CT 316 330             Lab Results   Component Value Date    HGBA1CPERCNT 5.7 01/21/2018     Recent Labs   Lab 02/15/18  0345 02/14/18  0346 02/13/18  0342   Glucose 140* 84 202*   Sodium 146 144 140   Potassium 3.9 3.9 4.8   Chloride 115* 111* 111*   CO2 21 22  17*   BUN 79* 74* 68*   Creatinine 2.94* 3.00*  3.09*   EGFR 23* 22* 21*   Calcium 7.8* 8.4* 8.6     Recent Labs   Lab 02/14/18  0346 02/12/18  0336   Albumin 2.0* 2.1*   Protein, Total 5.4* 5.4*   Bilirubin, Total 0.4 0.4   Alkaline Phosphatase 58 59   ALT 72* 66*   AST (SGOT) 62* 41           Invalid input(s):  AMORPHOUSUA   Patient Lines/Drains/Airways Status    Active PICC Line / CVC Line / PIV Line / Drain / Airway / Intraosseous Line / Epidural Line / ART Line / Line / Wound / Pressure Ulcer / NG/OG Tube     Name:   Placement date:   Placement time:   Site:   Days:    Peripheral IV 02/04/18 Anterior;Left Upper Arm   02/04/18    1610    Upper Arm   less than 1               Ct Chest Wo Contrast    Result Date: 02/12/2018  Development of bilateral moderate-sized pleural effusions. Associated lower lobe atelectasis and probable consolidation/pneumonia on the right. High density material within these regions,, suggesting aspiration of oral contrast. No distention or gross abnormality identified to the esophagus. ReadingStation:[id number]    Xr Chest Ap Portable    Result Date: 02/11/2018  Hypoinflated lungs with bronchovascular crowding and bibasilar atelectasis. No definite new focal consolidation to suggest pneumonia. [place], DO     02/17/18,11:36 AM   MRN: [id number]                                      CSN: [id number] DOB: [birth year]

## 2018-02-17 NOTE — Plan of Care (Signed)
Problem: Psychosocial and Spiritual Needs  Goal: Demonstrates ability to cope with hospitalization/illness  Outcome: Progressing     Problem: Pain interferes with ability to perform ADL  Goal: Pain at adequate level as identified by patient  Outcome: Progressing     Problem: Side Effects from Pain Analgesia  Goal: Patient will experience minimal side effects of analgesic therapy  Outcome: Progressing     Problem: Dyspnea  Goal: Dyspnea is at a manageable effort  Outcome: Progressing

## 2018-02-17 NOTE — Progress Notes (Signed)
[facility]   Patient Name: [name]   Attending Physician: Alba Cory, DO   Today's date:   02/17/2018 LOS: 10 days   Expected Discharge Date Expected Discharge Date: (pending placement)    Quick  Assessment:                                                              ReAdmit Risk Score: 23    CM Comments: DCP 02/17/18: Pt adm s/p fall, subdural hematoma.  PTA lived with niece as pcg.  Plan is for pt to be placed in LTC facility with Hospice, but needs payer source Medicaid is pending, per [facility], they are waiting for response from DSS about patients Medicaid    Physical Discharge Disposition: Long Term Care Bed at SNF                                                                          Physical Discharge Disposition: Long Term Care Bed at SNF       Provider Notifications:            Ritta Slot LPN /DCP   [id number]

## 2018-02-17 NOTE — Progress Notes (Signed)
02/17/18 1500   Visit Type   Visit Type Follow-up   Visit Source   Visit Source Chaplain Initiated   Present at Visit   Present at Visit Patient   Spiritual Assessment   Spiritual Assessment Unable to Assess (comment)  (pt asleep, no family in the room)   Spiritual Care Outcomes   Consulted With Nurse   Length of Visit   Length of Visit 0-15 minutes   Follow-Up   Follow-up Follow-up to reassess

## 2018-02-18 NOTE — Plan of Care (Signed)
Problem: Moderate/High Fall Risk Score >5  Goal: Patient will remain free of falls  Outcome: Progressing     Problem: Compromised Tissue integrity  Goal: Damaged tissue is healing and protected  Outcome: Progressing  Goal: Nutritional status is improving  Outcome: Progressing     Problem: Neurological Deficit  Goal: Neurological status is stable or improving  Outcome: Progressing     Problem: Potential for Aspiration  Goal: Risk of aspiration will be minimized  Outcome: Progressing     Problem: Compromised Hemodynamic Status  Goal: Vital signs and fluid balance maintained/improved  Outcome: Progressing     Problem: Impaired Mobility  Goal: Mobility/Activity is maintained at optimal level for patient  Outcome: Progressing     Problem: Infection  Goal: Free from infection  Outcome: Progressing     Problem: Psychosocial and Spiritual Needs  Goal: Demonstrates ability to cope with hospitalization/illness  Outcome: Progressing     Problem: Pain interferes with ability to perform ADL  Goal: Pain at adequate level as identified by patient  Outcome: Progressing     Problem: Side Effects from Pain Analgesia  Goal: Patient will experience minimal side effects of analgesic therapy  Outcome: Progressing     Problem: Dyspnea  Goal: Dyspnea is at a manageable effort  Outcome: Progressing

## 2018-02-18 NOTE — Plan of Care (Addendum)
NURSE NOTE SUMMARY  [facility] - [facility] [place]   Patient Name: [name]   Attending Physician: Alba Cory, DO   Today's date:   02/18/2018 LOS: 11 days   Shift Summary:                                                              Pt resting well this shift. Tolerating meds.    Provider Notifications:      Rapid Response Notifications:  Mobility:      PMP Activity: Step 1 - Bedrest (02/17/2018  7:32 PM)     Weight tracking:  Family Dynamic:   No data found.          Recent Vitals Last Bowel Movement   BP: 144/72 (02/17/2018  7:34 AM)  Heart Rate: (!) 110 (02/17/2018  6:24 AM)  Temp: 100.4 F (38 C) (02/17/2018  6:24 AM)  Resp Rate: 14 (02/17/2018  6:24 AM)  SpO2: 97 % (02/17/2018  6:24 AM)   Last BM Date: 02/17/18       Problem: Moderate/High Fall Risk Score >5  Goal: Patient will remain free of falls  Outcome: Progressing  Flowsheets (Taken 02/17/2018 0800 by Art Buff, RN)  VH High Risk (Greater than 13): ALL REQUIRED MODERATE INTERVENTIONS;ALL REQUIRED LOW INTERVENTIONS;RED "HIGH FALL RISK" SIGNAGE;BED ALARM WILL BE ACTIVATED WHEN THE PATEINT IS IN BED WITH SIGNAGE "RESET BED ALARM";A CHAIR PAD ALARM WILL BE USED WHEN PATIENT IS UP SITTING IN A CHAIR;PATIENT IS TO BE SUPERVISED FOR ALL TOILETING ACTIVITIES;Keep door open for better visibility;Include family/significant other in multidisciplinary discussion regarding plan of care as appropriate;Request PT/OT therapy consult order from physician for patients with gait/mobility impairment;Use assistive devices;Use chair-pad alarm device     Problem: Compromised Tissue integrity  Goal: Damaged tissue is healing and protected  Outcome: Progressing  Flowsheets (Taken 02/17/2018 0329)  Damaged tissue is healing and protected : Monitor/assess Braden scale every shift;Provide wound care per wound care algorithm;Reposition patient every 2 hours and as needed unless able to reposition self;Increase activity as tolerated/progressive  mobility;Relieve pressure to bony prominences for patients at moderate and high risk;Avoid shearing injuries;Keep intact skin clean and dry;Use bath wipes, not soap and water, for daily bathing;Use incontinence wipes for cleaning urine, stool and caustic drainage. Foley care as needed;Monitor external devices/tubes for correct placement to prevent pressure, friction and shearing;Monitor patient's hygiene practices;Encourage use of lotion/moisturizer on skin;Consult/collaborate with wound care nurse;Utilize specialty bed;Consider placing an indwelling catheter if incontinence interferes with healing of stage 3 or 4 pressure injury

## 2018-02-18 NOTE — Plan of Care (Addendum)
NURSE NOTE SUMMARY  [facility]   Patient Name: [name]   Attending Physician: Louisa Second, MD   Today's date:   02/19/2018 LOS: 12 days   Shift Summary:                                                              Assumed care of patient at 13. Patient resting comfortably in bed. No signs of distress. Comfort measures in place. Scheduled tylenol given. Patient incont of bowel and bladder. Incontinence care provided and bed bath given. All needs addressed this shift.    Provider Notifications:      Rapid Response Notifications:  Mobility:      PMP Activity: Step 1 - Bedrest (02/18/2018 11:29 PM)     Weight tracking:  Family Dynamic:   No data found.          Recent Vitals Last Bowel Movement   BP: 144/74 (02/18/2018  9:59 AM)  Heart Rate: (!) 107 (02/18/2018  7:08 AM)  Temp: 98.6 F (37 C) (02/18/2018  7:08 AM)  Resp Rate: 16 (02/18/2018  7:08 AM)  SpO2: 98 % (02/18/2018  7:08 AM)   Last BM Date: 02/18/18       Problem: Moderate/High Fall Risk Score >5  Goal: Patient will remain free of falls  Outcome: Progressing  Flowsheets (Taken 02/12/2018 0745 by Alan Ripper, RN)  High (Greater than 13): LOW-Fall Interventions Appropriate for Low Fall Risk;HIGH-Activate bed/chair exit alarm where available;HIGH-Apply yellow "Fall Risk" arm band;HIGH-(VH Only) Place red fall prevention sign on door/chart;HIGH-(VH Only) Yellow slippers;HIGH-(VH Only) Keep door open for better visability     Problem: Psychosocial and Spiritual Needs  Goal: Demonstrates ability to cope with hospitalization/illness  Outcome: Progressing  Flowsheets (Taken 02/17/2018 0329 by Luna Fuse, RN)  Demonstrates ability to cope with hospitalization/illness (Palliative Care): Provide quiet environment;Consult/collaborate with pastoral/spiritual care, social services, mental health counselor as needed;Assist patient to identify own strengths and abilities     Problem: Pain interferes with ability to perform  ADL  Goal: Pain at adequate level as identified by patient  Outcome: Progressing  Flowsheets (Taken 02/16/2018 0232 by Eugenie Filler, RN)  Pain at adequate level as identified by patient: Identify patient comfort function goal;Assess for risk of opioid induced respiratory depression, including snoring/sleep apnea. Alert healthcare team of risk factors identified.;Assess pain on admission, during daily assessment and/or before any "as needed" intervention(s);Reassess pain within 30-60 minutes of any procedure/intervention, per Pain Assessment, Intervention, Reassessment (AIR) Cycle;Evaluate if patient comfort function goal is met;Evaluate patient's satisfaction with pain management progress;Offer non-pharmacological pain management interventions;Consult/collaborate with Pain Service;Consult/collaborate with Physical Therapy, Occupational Therapy, and/or Speech Therapy;Include patient/patient care companion in decisions related to pain management as needed

## 2018-02-18 NOTE — Progress Notes (Signed)
Medicine Progress Note   [facility]   Patient Name: [name], [name] LOS: 11 days   Attending Physician: Louisa Second, MD PCP: Melvyn Neth, MD      Hospital Course:                                                            Hendricks Milo Junior Hoffman is a 78 y.o. male Ms. past medical history old stroke, CAD,diabetes mellitus, hypertension,stage 4 CKD and dementia who came in for extended for and skin laceration.  Patient was recently dced on plavix and eliquis . Eliquis was added on last admit bc of PE and DVT. Patient has dementia patient cannot provide any medical information history obtained from the ER physician and the chart.  Patient was found to have possible tiny subdural hematoma.  Seen by neurosurgical service, recommend observation overnight and repeat a CT scan of the head if there is no hematoma patient can be discharged.  initially held  Plavix/eliquis.  The scalp laceration been sutured. Repeat CT showed no evidence of bleed. Meds resumed. Placement pending as family states they cannot take care of him at home.    Has persistent leukocytosis likely from aspiration PNA.  Cr range seems WNL as per his baseline ckd 4.    Salted palliative care because of the re-escalating issues and poor prognosis.  Family seems to be agreeable transitioning to comfort or hospice type care. After speaking with Dr. Dorthy Cooler it turns out family doesn't want IV antibiotics continued, wants only those measures that provide comfort, discontinuing antibiotics. Currently awaiting placement for hospice.      Assessment and Plan:      Right sided Aspiration PNA on CT--POA  --> had MBSS-- has op mod dysphagia --diet modified  --  MRSA nares negative  -- Influenza A n B -- neg     Patient's aspiration pneumonia initially seem to be getting better however patient took a turn into the weekend.  We cannot as CT repeated shows worsening.  Patient just started having fevers and white  cell count kept going up  Vancomycin added to Zosyn  2/4 transitioning to comfort care.. After conversations with palliative care family doesn't want antibiotics as intervention, only those for comfort, discontinued.      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  This is detected on the CT of the neck   CT of chest without contrast    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    Stage 4 CKD  Creatinine uptrending despite fluids  Renal ultrasound showed medical renal disease    Diarrhea -- possibly abx associated --  c diff study negative  Give imodium as needed    Hypertension -- continue with current care   No need for aggressive management because of comfort care    Diabetesmellitus    Single-vessel CAD --stable --continue plavix and  statin     Acute Bilateral PE diagnosed Dec 2019    Capacity,   -patient grunting in response to questions, otherwise patient AO x 0, spoke to RN this is normal for patient, no change.   DVT PPx: held 2/2 CMO    Dispo:     Pending arrangements for comfort care    Code:  DNR ( made with poa 2/1)     Subjective   Patient is awake but non-interactive.  No acute events overnight.      Objective   Physical Exam:     Vitals: T:98.6 F (37 C) (Oral), BP:144/74, HR:(!) 107, RR:16, SaO2:98%    General: Patient is hypoactive . Grunts in response to questions  HEENT: No conjunctival drainage, vision is intact, anicteric sclera.  Neck: Supple, no thyromegaly.  Chest:  mild rhonchi b/l  . dminished at bases .Marland Kitchen no wheezing. No use of accessory muscles.  CVS: Normal rate and regular rhythm no murmurs, without JVD, no pitting edema, pulses palpable.  Abdomen: Soft, non-tender, no guarding or rigidity, with normal bowel sounds.  Extremities: No calf swelling and no gross deformity.  Skin: Warm, dry, no rash and no worrisome lesions.  NEURO: No motor or sensory deficits.  Psychiatric: Alert, looks at me, grunts, but otherwise not interactive   Weight Monitoring 11/28/2017 12/29/2017 12/29/2017 12/31/2017 01/01/2018 01/17/2018 01/19/2018   Height 167.6 cm - 167.6 cm - - 177.8 cm 177.8 cm   Weight 66.679 kg 64 kg 64 kg 63.2 kg 62.1 kg 64.91 kg 63.7 kg   Weight Method - Actual - Standing Scale Standing Scale - Bed Scale   BMI (calculated) 23.8 kg/m2 - 22.8 kg/m2 - - 20.6 kg/m2 20.2 kg/m2         Intake/Output Summary (Last 24 hours) at 02/18/2018 1105  Last data filed at 02/17/2018 1700  Gross per 24 hour   Intake 600 ml   Output    Net 600 ml     Body mass index is 20.15 kg/m.     Meds:     Current Facility-Administered Medications   Medication Dose Route Frequency    acetaminophen  325 mg Oral TID    amLODIPine  10 mg Oral QAM    finasteride  5 mg Oral QAM    haloperidol  0.5 mg Oral QHS    sodium chloride (PF)  3 mL Intravenous Q8H       PRN Meds: acetaminophen, baclofen, haloperidol, HYDROmorphone, loperamide, naloxone, ondansetron, promethazine.     LABS:     Estimated Creatinine Clearance: 19 mL/min (A) (based on SCr of 2.94 mg/dL (H)).  Recent Labs   Lab 02/15/18  0345 02/14/18  0346   WBC 13.6* 16.7*   RBC 3.02* 3.33*   Hemoglobin 9.1* 10.2*   Hematocrit 29.0* 32.2*   MCV 96 97   PLT CT 316 330             Lab Results   Component Value Date    HGBA1CPERCNT 5.7 01/12/2018     Recent Labs   Lab 02/15/18  0345 02/14/18  0346 02/13/18  0342   Glucose 140* 84 202*   Sodium 146 144 140   Potassium 3.9 3.9 4.8   Chloride 115* 111* 111*   CO2  21 22 17*   BUN 79* 74* 68*   Creatinine 2.94*  3.00* 3.09*   EGFR 23* 22* 21*   Calcium 7.8* 8.4* 8.6     Recent Labs   Lab 02/14/18  0346 02/12/18  0336   Albumin 2.0* 2.1*   Protein, Total 5.4* 5.4*   Bilirubin, Total 0.4 0.4   Alkaline Phosphatase 58 59   ALT 72* 66*   AST (SGOT) 62* 41           Invalid input(s):  AMORPHOUSUA   Patient Lines/Drains/Airways Status    Active PICC Line / CVC Line / PIV Line / Drain / Airway / Intraosseous Line / Epidural Line / ART Line / Line / Wound / Pressure Ulcer / NG/OG Tube     Name:   Placement date:   Placement time:   Site:   Days:    Peripheral IV 02/04/18 Anterior;Left Upper Arm   02/04/18    1610    Upper Arm   less than 1               Ct Chest Wo Contrast    Result Date: 02/12/2018  Development of bilateral moderate-sized pleural effusions. Associated lower lobe atelectasis and probable consolidation/pneumonia on the right. High density material within these regions,, suggesting aspiration of oral contrast. No distention or gross abnormality identified to the esophagus. ReadingStation:[id number]          Louisa Second, MD     02/18/18,11:05 AM   MRN: [id number]                                      CSN: [id number] DOB: 06/15/1940

## 2018-02-19 NOTE — Plan of Care (Signed)
Problem: Moderate/High Fall Risk Score >5  Goal: Patient will remain free of falls  Outcome: Progressing     Problem: Compromised Tissue integrity  Goal: Damaged tissue is healing and protected  Outcome: Progressing  Goal: Nutritional status is improving  Outcome: Progressing     Problem: Neurological Deficit  Goal: Neurological status is stable or improving  Outcome: Progressing     Problem: Potential for Aspiration  Goal: Risk of aspiration will be minimized  Outcome: Progressing     Problem: Compromised Hemodynamic Status  Goal: Vital signs and fluid balance maintained/improved  Outcome: Progressing     Problem: Impaired Mobility  Goal: Mobility/Activity is maintained at optimal level for patient  Outcome: Progressing     Problem: Infection  Goal: Free from infection  Outcome: Progressing     Problem: Psychosocial and Spiritual Needs  Goal: Demonstrates ability to cope with hospitalization/illness  Outcome: Progressing     Problem: Pain interferes with ability to perform ADL  Goal: Pain at adequate level as identified by patient  Outcome: Progressing     Problem: Side Effects from Pain Analgesia  Goal: Patient will experience minimal side effects of analgesic therapy  Outcome: Progressing     Problem: Dyspnea  Goal: Dyspnea is at a manageable effort  Outcome: Progressing

## 2018-02-19 NOTE — Progress Notes (Signed)
Medicine Progress Note   [facility]   Patient Name: [name], [name] LOS: 12 days   Attending Physician: Alba Cory, DO PCP: Melvyn Neth, MD      Hospital Course:                                                            Hendricks Milo Junior Chittum is a 78 y.o. male Ms. past medical history old stroke, CAD,diabetes mellitus, hypertension,stage 4 CKD and dementia who came in for extended for and skin laceration.  Patient was recently dced on plavix and eliquis . Eliquis was added on last admit bc of PE and DVT. Patient has dementia patient cannot provide any medical information history obtained from the ER physician and the chart.  Patient was found to have possible tiny subdural hematoma.  Seen by neurosurgical service, recommend observation overnight and repeat a CT scan of the head if there is no hematoma patient can be discharged.  initially held  Plavix/eliquis.  The scalp laceration been sutured. Repeat CT showed no evidence of bleed. Meds resumed. Placement pending as family states they cannot take care of him at home.    Has persistent leukocytosis likely from aspiration PNA.  Cr range seems WNL as per his baseline ckd 4.    Consulted palliative care because of the re-escalating issues and poor prognosis.  Family seems to be agreeable transitioning to comfort or hospice type care. After speaking with Dr. Dorthy Cooler it turns out family doesn't want IV antibiotics continued, wants only those measures that provide comfort, discontinuing antibiotics. Currently awaiting placement for hospice.      Assessment and Plan:      Right sided Aspiration PNA on CT--POA  --> had MBSS-- has op mod dysphagia --diet modified  --  MRSA nares negative  -- Influenza A n B -- neg     Patient's aspiration pneumonia initially seem to be getting better however patient took a turn into the weekend.  We cannot as CT repeated shows worsening.  Patient just started having fevers and  white cell count kept going up  Vancomycin added to Zosyn  2/4 transitioning to comfort care.. After conversations with palliative care family doesn't want antibiotics as intervention, only those for comfort, discontinued.      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  This is detected on the CT of the neck   CT of chest without contrast    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    Stage 4 CKD  Creatinine uptrending despite fluids  Renal ultrasound showed medical renal disease    Diarrhea -- possibly abx associated --  c diff study negative  Give imodium as needed    Hypertension -- continue with current care   No need for aggressive management because of comfort care    Diabetesmellitus    Single-vessel CAD --stable --continue plavix and  statin     Acute Bilateral PE diagnosed Dec 2019    Capacity,   -patient grunting in response to questions, otherwise patient AO x 0, spoke to RN this is normal for patient, no change.   DVT PPx: held 2/2 CMO    Dispo:     Pending arrangements for comfort care    Code:  DNR ( made with poa 2/1)     Subjective   Awake, looks at me, apparently was holding a cup yesterday with RN. Otherwise no events.       Objective   Physical Exam:     Vitals: T:98.6 F (37 C) (Oral), BP:144/74, HR:(!) 107, RR:16, SaO2:98%    General: Patient is hypoactive . No grunts, looked at me.   HEENT: No conjunctival drainage, vision is intact, anicteric sclera.  Neck: Supple,  no thyromegaly.  Chest: lungs clearing, no rhonchi today, no wheezing. No use of accessory muscles.  CVS: Normal rate and regular rhythm no murmurs, without JVD, no pitting edema, pulses palpable.  Abdomen: Soft, non-tender, no guarding or rigidity, with normal bowel sounds.  Extremities: No calf swelling and no gross deformity.  Skin: Warm, dry, no rash and no worrisome lesions.  NEURO: No motor or sensory deficits.  Psychiatric: Alert, looks at me, grunts, but otherwise not interactive   Weight Monitoring 11/28/2017 12/29/2017 12/29/2017 12/31/2017 01/01/2018 01/17/2018 01/19/2018   Height 167.6 cm - 167.6 cm - - 177.8 cm 177.8 cm   Weight 66.679 kg 64 kg 64 kg 63.2 kg 62.1 kg 64.91 kg 63.7 kg   Weight Method - Actual - Standing Scale Standing Scale - Bed Scale   BMI (calculated) 23.8 kg/m2 - 22.8 kg/m2 - - 20.6 kg/m2 20.2 kg/m2         Intake/Output Summary (Last 24 hours) at 02/19/2018 0918  Last data filed at 02/18/2018 1154  Gross per 24 hour   Intake 120 ml   Output    Net 120 ml     Body mass index is 20.15 kg/m.     Meds:     Current Facility-Administered Medications   Medication Dose Route Frequency    acetaminophen  325 mg Oral TID    amLODIPine  10 mg Oral QAM    finasteride  5 mg Oral QAM    haloperidol  0.5 mg Oral QHS    sodium chloride (PF)  3 mL Intravenous Q8H       PRN Meds: acetaminophen, baclofen, haloperidol, HYDROmorphone, loperamide, naloxone, ondansetron, promethazine.     LABS:     Estimated Creatinine Clearance: 19 mL/min (A) (based on SCr of 2.94 mg/dL (H)).  Recent Labs   Lab 02/15/18  0345 02/14/18  0346   WBC 13.6* 16.7*   RBC 3.02* 3.33*   Hemoglobin 9.1* 10.2*   Hematocrit 29.0* 32.2*   MCV 96 97   PLT CT 316 330             Lab Results   Component Value Date    HGBA1CPERCNT 5.7 01/15/2018     Recent Labs   Lab 02/15/18  0345 02/14/18  0346 02/13/18  0342   Glucose 140* 84 202*   Sodium 146 144 140   Potassium 3.9 3.9 4.8   Chloride 115* 111* 111*  CO2 21 22 17*   BUN 79* 74* 68*    Creatinine 2.94* 3.00* 3.09*   EGFR 23* 22* 21*   Calcium 7.8* 8.4* 8.6     Recent Labs   Lab 02/14/18  0346   Albumin 2.0*   Protein, Total 5.4*   Bilirubin, Total 0.4   Alkaline Phosphatase 58   ALT 72*   AST (SGOT) 62*           Invalid input(s):  AMORPHOUSUA   Patient Lines/Drains/Airways Status    Active PICC Line / CVC Line / PIV Line / Drain / Airway / Intraosseous Line / Epidural Line / ART Line / Line / Wound / Pressure Ulcer / NG/OG Tube     Name:   Placement date:   Placement time:   Site:   Days:    Peripheral IV 02/04/18 Anterior;Left Upper Arm   02/04/18    1610    Upper Arm   less than 1               No results found.        Alba Cory, DO     02/19/18,9:18 AM   MRN: [id number]                                      CSN: [id number] DOB: [birth year]

## 2018-02-20 NOTE — Progress Notes (Signed)
[facility] [place]   Patient Name: [name]   Attending Physician: Alba Cory, DO   Today's date:   02/20/2018 LOS: 13 days   Expected Discharge Date Expected Discharge Date: (pending placement)    Quick  Assessment:                                                              ReAdmit Risk Score: 16    CM Comments: DCP 02/20/18: Pt adm s/p fall, subdural hematoma.  PTA lived with niece as pcg.  Plan is for pt to be placed in LTC facility with Hospice, but needs payer source Medicaid is pending, per [facility], they are waiting for response from DSS about patients Medicaid, CM supervisor has offered contract to [facility] until Medicaid is approved, DCP awaiting response from [facility]    Physical Discharge Disposition: Long Term Care Bed at [facility]                                                                          Physical Discharge Disposition: Long Term Care Bed at [facility]       Provider Notifications:          Ritta Slot LPN /DCP   [id number]

## 2018-02-20 NOTE — Progress Notes (Signed)
Medicine Progress Note   [facility]   Patient Name: [name], [name] LOS: 13 days   Attending Physician: Alba Cory, DO PCP: Melvyn Neth, MD      Hospital Course:                                                            Hendricks Milo Junior Hoffman is a 78 y.o. male Ms. past medical history old stroke, CAD,diabetes mellitus, hypertension,stage 4 CKD and dementia who came in for extended for and skin laceration.  Patient was recently dced on plavix and eliquis . Eliquis was added on last admit bc of PE and DVT. Patient has dementia patient cannot provide any medical information history obtained from the ER physician and the chart.  Patient was found to have possible tiny subdural hematoma.  Seen by neurosurgical service, recommend observation overnight and repeat a CT scan of the head if there is no hematoma patient can be discharged.  initially held  Plavix/eliquis.  The scalp laceration been sutured. Repeat CT showed no evidence of bleed. Meds resumed. Placement pending as family states they cannot take care of him at home.    Has persistent leukocytosis likely from aspiration PNA.  Cr range seems WNL as per his baseline ckd 4.    Consulted palliative care because of the re-escalating issues and poor prognosis.  Family seems to be agreeable transitioning to comfort or hospice type care. After speaking with Dr. Dorthy Cooler it turns out family doesn't want IV antibiotics continued, wants only those measures that provide comfort, discontinuing antibiotics. Currently awaiting placement for hospice.      Assessment and Plan:      Right sided Aspiration PNA on CT--POA  --> had MBSS-- has op mod dysphagia --diet modified  --  MRSA nares negative  -- Influenza A n B -- neg     Patient's aspiration pneumonia initially seem to be getting better however patient took a turn into the weekend.  We cannot as CT repeated shows worsening.  Patient just started having fevers and  white cell count kept going up  Vancomycin added to Zosyn  2/4 transitioning to comfort care.. After conversations with palliative care family doesn't want antibiotics as intervention, only those for comfort, discontinued.      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  This is detected on the CT of the neck   CT of chest without contrast    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    Stage 4 CKD  Creatinine uptrending despite fluids  Renal ultrasound showed medical renal disease    Diarrhea -- possibly abx associated --  c diff study negative  Give imodium as needed    Hypertension -- continue with current care   No need for aggressive management because of comfort care    Diabetesmellitus    Single-vessel CAD --stable --continue plavix and  statin     Acute Bilateral PE diagnosed Dec 2019    Capacity,   -patient grunting in response to questions, otherwise patient AO x 0, spoke to RN this is normal for patient, no change.   DVT PPx: held 2/2 CMO    Dispo:     Pending arrangements for comfort care    Code:  DNR ( made with poa 2/1)     Subjective   Awake, waved to me slightly, otherwise no events.      Objective   Physical Exam:     Vitals: T:98.6 F (37 C) (Oral), BP:144/74, HR:(!) 107, RR:16, SaO2:98%    General: Patient is hypoactive . No grunts, looked at me.   HEENT: No conjunctival drainage, vision is intact, anicteric sclera.  Neck: Supple, no thyromegaly.  Chest: lungs clearing,  no rhonchi today, no wheezing. No use of accessory muscles.  CVS: Normal rate and regular rhythm no murmurs, without JVD, no pitting edema, pulses palpable.  Abdomen: Soft, non-tender, no guarding or rigidity, with normal bowel sounds.  Extremities: No calf swelling and no gross deformity.  Skin: Warm, dry, no rash and no worrisome lesions.  NEURO: No motor or sensory deficits.  Psychiatric: Alert, looks at me, grunts, but otherwise not interactive   Weight Monitoring 11/28/2017 12/29/2017 12/29/2017 12/31/2017 01/01/2018 01/17/2018 01/28/2018   Height 167.6 cm - 167.6 cm - - 177.8 cm 177.8 cm   Weight 66.679 kg 64 kg 64 kg 63.2 kg 62.1 kg 64.91 kg 63.7 kg   Weight Method - Actual - Standing Scale Standing Scale - Bed Scale   BMI (calculated) 23.8 kg/m2 - 22.8 kg/m2 - - 20.6 kg/m2 20.2 kg/m2       No intake or output data in the 24 hours ending 02/20/18 0932  Body mass index is 20.15 kg/m.     Meds:     Current Facility-Administered Medications   Medication Dose Route Frequency    acetaminophen  325 mg Oral TID    amLODIPine  10 mg Oral QAM    finasteride  5 mg Oral QAM    haloperidol  0.5 mg Oral QHS    sodium chloride (PF)  3 mL Intravenous Q8H       PRN Meds: acetaminophen, baclofen, haloperidol, HYDROmorphone, loperamide, naloxone, ondansetron, promethazine.     LABS:     Estimated Creatinine Clearance: 19 mL/min (A) (based on SCr of 2.94 mg/dL (H)).  Recent Labs   Lab 02/15/18  0345 02/14/18  0346   WBC 13.6* 16.7*   RBC 3.02* 3.33*   Hemoglobin 9.1* 10.2*   Hematocrit 29.0* 32.2*   MCV 96 97   PLT CT 316 330             Lab Results   Component Value Date    HGBA1CPERCNT 5.7 02/07/2018     Recent Labs   Lab 02/15/18  0345 02/14/18  0346   Glucose 140* 84   Sodium 146 144   Potassium 3.9 3.9   Chloride 115* 111*   CO2 21 22   BUN 79* 74*   Creatinine 2.94* 3.00*   EGFR 23* 22*   Calcium 7.8* 8.4*     Recent Labs   Lab 02/14/18  0346   Albumin 2.0*  Protein, Total 5.4*   Bilirubin, Total 0.4   Alkaline  Phosphatase 58   ALT 72*   AST (SGOT) 62*           Invalid input(s):  AMORPHOUSUA   Patient Lines/Drains/Airways Status    Active PICC Line / CVC Line / PIV Line / Drain / Airway / Intraosseous Line / Epidural Line / ART Line / Line / Wound / Pressure Ulcer / NG/OG Tube     Name:   Placement date:   Placement time:   Site:   Days:    Peripheral IV 02/04/18 Anterior;Left Upper Arm   02/04/18    1610    Upper Arm   less than 1               No results found.        Alba Cory, DO     02/20/18,9:32 AM   MRN: [id number]                                      CSN: [id number] DOB: [birth year]

## 2018-02-20 NOTE — Plan of Care (Addendum)
NURSE NOTE SUMMARY  [facility]   Patient Name: [name]   Attending Physician: Alba Cory, DO   Today's date:   02/20/2018 LOS: 13 days   Shift Summary:                                                              Pt unchanged this shift. Niece in to visit this shift. Incontinent of bladder this shift. VSS   Provider Notifications:      Rapid Response Notifications:  Mobility:      PMP Activity: Step 3 - Bed Mobility (02/20/2018  7:26 AM)     Weight tracking:  Family Dynamic:   No data found.          Recent Vitals Last Bowel Movement   BP: 144/74 (02/20/2018  9:27 AM)   Last BM Date: 02/19/18       Comfort care.

## 2018-02-20 NOTE — Plan of Care (Addendum)
NURSE NOTE SUMMARY  [facility]   Patient Name: [name]   Attending Physician: Alba Cory, DO   Today's date:   02/20/2018 LOS: 13 days   Shift Summary:                                                              Uneventful shift. Resting well. No discomfort noted. Will continue to monitor.    Provider Notifications:      Rapid Response Notifications:  Mobility:      PMP Activity: Step 1 - Bedrest (02/19/2018  7:29 PM)     Weight tracking:  Family Dynamic:   No data found.          Recent Vitals Last Bowel Movement   BP: 144/74 (02/19/2018  9:21 AM)   Last BM Date: 02/19/18       Problem: Moderate/High Fall Risk Score >5  Goal: Patient will remain free of falls  Outcome: Progressing  Flowsheets (Taken 02/19/2018 1929)  VH High Risk (Greater than 13): ALL REQUIRED LOW INTERVENTIONS;ALL REQUIRED MODERATE INTERVENTIONS;RED "HIGH FALL RISK" SIGNAGE;BED ALARM WILL BE ACTIVATED WHEN THE PATEINT IS IN BED WITH SIGNAGE "RESET BED ALARM";A CHAIR PAD ALARM WILL BE USED WHEN PATIENT IS UP SITTING IN A CHAIR;PATIENT IS TO BE SUPERVISED FOR ALL TOILETING ACTIVITIES     Problem: Compromised Tissue integrity  Goal: Damaged tissue is healing and protected  Outcome: Progressing  Flowsheets (Taken 02/17/2018 0329)  Damaged tissue is healing and protected : Monitor/assess Braden scale every shift;Provide wound care per wound care algorithm;Reposition patient every 2 hours and as needed unless able to reposition self;Increase activity as tolerated/progressive mobility;Relieve pressure to bony prominences for patients at moderate and high risk;Avoid shearing injuries;Keep intact skin clean and dry;Use bath wipes, not soap and water, for daily bathing;Use incontinence wipes for cleaning urine, stool and caustic drainage. Foley care as needed;Monitor external devices/tubes for correct placement to prevent pressure, friction and shearing;Monitor patient's hygiene practices;Encourage use  of lotion/moisturizer on skin;Consult/collaborate with wound care nurse;Utilize specialty bed;Consider placing an indwelling catheter if incontinence interferes with healing of stage 3 or 4 pressure injury     Problem: Compromised Hemodynamic Status  Goal: Vital signs and fluid balance maintained/improved  Outcome: Progressing  Flowsheets (Taken 02/20/2018 0246)  Vital signs and fluid balance are maintained/improved: Position patient for maximum circulation/cardiac output; Monitor/assess vitals and hemodynamic parameters with position changes; Monitor and compare daily weight; Monitor intake and output. Notify LIP if urine output is less than 30 mL/hour.     Problem: Impaired Mobility  Goal: Mobility/Activity is maintained at optimal level for patient  Outcome: Progressing  Flowsheets (Taken 02/09/2018 0230)  Mobility/activity is maintained at optimal level for patient: Increase mobility as tolerated/progressive mobility;Perform active/passive ROM;Encourage independent activity per ability;Maintain proper body alignment;Plan activities to conserve energy, plan rest periods;Reposition patient every 2 hours and as needed unless able to reposition self;Assess for changes in respiratory status, level of consciousness and/or development of fatigue;Consult/collaborate with Physical Therapy and/or Occupational Therapy     Problem: Psychosocial and Spiritual Needs  Goal: Demonstrates ability to cope with hospitalization/illness  Outcome: Progressing  Flowsheets (Taken 02/17/2018 0329)  Demonstrates ability to cope with hospitalization/illness (Palliative Care): Provide quiet environment;Consult/collaborate with pastoral/spiritual care, social services,  mental health counselor as needed;Assist patient to identify own strengths and abilities

## 2018-02-21 MED ORDER — HYDROMORPHONE HCL 0.5 MG/0.5 ML IJ SOLN
0.20 mg | Freq: Four times a day (QID) | INTRAMUSCULAR | Status: DC
Start: 2018-02-21 — End: 2018-02-22
  Administered 2018-02-22: 06:00:00 0.2 mg via INTRAVENOUS
  Filled 2018-02-21: qty 0.5

## 2018-02-21 MED ORDER — HYDROMORPHONE HCL 0.5 MG/0.5 ML IJ SOLN
0.50 mg | INTRAMUSCULAR | Status: DC | PRN
Start: 2018-02-21 — End: 2018-02-25
  Administered 2018-02-25: 0.5 mg via INTRAVENOUS
  Filled 2018-02-21: qty 0.5

## 2018-02-21 NOTE — Plan of Care (Addendum)
NURSE NOTE SUMMARY  [facility]   Patient Name: [name]   Attending Physician: Alba Cory, DO   Today's date:   02/21/2018 LOS: 14 days   Shift Summary:                                                              Pt continues to eat and drink very well. Incontinent of B/B. Niece in this afternoon. VSS   Provider Notifications:      Rapid Response Notifications:  Mobility:      PMP Activity: Step 3 - Bed Mobility (02/21/2018  8:00 AM)     Weight tracking:  Family Dynamic:   No data found.          Recent Vitals Last Bowel Movement   BP: 120/67 (02/21/2018  8:57 AM)  Heart Rate: (!) 117 (02/21/2018  7:37 AM)  Temp: 98.1 F (36.7 C) (02/21/2018  7:37 AM)  Resp Rate: 16 (02/21/2018  7:37 AM)  SpO2: 98 % (02/21/2018  7:37 AM)   Last BM Date: 02/21/18       Pt comfort care

## 2018-02-21 NOTE — Plan of Care (Addendum)
NURSE NOTE SUMMARY  [facility] [place]   Patient Name: [name]   Attending Physician: Alba Cory, DO   Today's date:   02/21/2018 LOS: 14 days   Shift Summary:                                                              Pt resting well this shift. No obvious distress noted. Incontinent of bowel and bladder. Will continue to monitor.    Provider Notifications:      Rapid Response Notifications:  Mobility:      PMP Activity: Step 3 - Bed Mobility (02/20/2018  7:10 PM)     Weight tracking:  Family Dynamic:   No data found.          Recent Vitals Last Bowel Movement   BP: 123/81 (02/20/2018 11:16 PM)  Heart Rate: (!) 120 (02/20/2018 11:16 PM)  Temp: 99.1 F (37.3 C) (02/20/2018 11:16 PM)  Resp Rate: 16 (02/20/2018 11:16 PM)  SpO2: 99 % (02/20/2018 11:16 PM)   Last BM Date: 02/20/18       Problem: Moderate/High Fall Risk Score >5  Goal: Patient will remain free of falls  Outcome: Progressing  Flowsheets (Taken 02/20/2018 1910)  VH High Risk (Greater than 13): ALL REQUIRED LOW INTERVENTIONS;ALL REQUIRED MODERATE INTERVENTIONS;RED "HIGH FALL RISK" SIGNAGE;BED ALARM WILL BE ACTIVATED WHEN THE PATEINT IS IN BED WITH SIGNAGE "RESET BED ALARM";A CHAIR PAD ALARM WILL BE USED WHEN PATIENT IS UP SITTING IN A CHAIR;PATIENT IS TO BE SUPERVISED FOR ALL TOILETING ACTIVITIES     Problem: Psychosocial and Spiritual Needs  Goal: Demonstrates ability to cope with hospitalization/illness  Outcome: Progressing  Flowsheets (Taken 02/17/2018 0329)  Demonstrates ability to cope with hospitalization/illness (Palliative Care): Provide quiet environment;Consult/collaborate with pastoral/spiritual care, social services, mental health counselor as needed;Assist patient to identify own strengths and abilities

## 2018-02-21 NOTE — Plan of Care (Addendum)
NURSE NOTE SUMMARY  [facility]   Patient Name: [name]   Attending Physician: Alba Cory, DO   Today's date:   02/22/2018 LOS: 15 days   Shift Summary:                                                              Patient's advanced dementia pain score is 0. Patient appears comfortable. Patient is incontinent of bowel and bladder.    Provider Notifications:      Rapid Response Notifications:  Mobility:      PMP Activity: Step 3 - Bed Mobility (02/21/2018 10:24 PM)     Weight tracking:  Family Dynamic:   No data found.          Recent Vitals Last Bowel Movement   BP: 120/67 (02/21/2018  8:57 AM)  Heart Rate: (!) 117 (02/21/2018  7:37 AM)  Temp: 98.1 F (36.7 C) (02/21/2018  7:37 AM)  Resp Rate: 16 (02/21/2018  7:37 AM)  SpO2: 98 % (02/21/2018  7:37 AM)   Last BM Date: 02/21/18       Problem: Moderate/High Fall Risk Score >5  Goal: Patient will remain free of falls  Outcome: Progressing     Problem: Potential for Aspiration  Goal: Risk of aspiration will be minimized  Outcome: Progressing     Problem: Side Effects from Pain Analgesia  Goal: Patient will experience minimal side effects of analgesic therapy  Outcome: Progressing

## 2018-02-21 NOTE — Progress Notes (Signed)
[name]  Follow-up Assessment  Date, Time: 02/21/18 12:13 PM  Patient Name: [name]  Referring Physician:  Alba Cory, DO   Primary Care Physician: Berdine Dance Clarita Crane, MD  Consulting Team: Odis Hollingshead, MD; Filbert Schilder, MD; Eulogio Ditch, PA; Adaline Sill, [place]  Consulting Service: Palliative Medicine  Reason for Follow-up: goals of care, advance care planning, clarification of prognosis and hospice information and/or referral  Palliative care is a specialized, interdisciplinary approach to improving comfort and quality of life at any stage of a serious illness by addressing symptoms, communication, and transitions.       Assessment & Recommendations   Impression / Assessment:  Dyspnea/tachypnea  Metabolic encephalopathy  Aspiration pneumonia  Advanced vascular dementia  Acute kidney injury on chronic kidney disease    Recommendations / Plan:  Pain:   Continue scheduled Tylenol liquid 325 mg 3 times daily  Has Dilaudid 0.5 mg IV every 4 hours as needed pain    Non-pain Symptoms:     Dyspnea  Begin Dilaudid 0.2 mg IV QID scheduled  Add Dilaudid 0.5 mg IV q 4 hrs for severe pain or dyspnea  Place fan in room    Agitation  Continue Haldol 0.5 mg SL q HS  Keep Haldol 0.5 mg SL q 4 hrs for uncontrolled agitation    Other medication recommendations:   Recommend deprescribing the following medications:  Other oral medications - amlodipine, finasteride    Nutrition: comfort feeding as tolerated with aspiration precautions  Consultants: Hospice and Pastoral care  Goals of care:     The patient is awaiting nursing home placement with hospice. Currently he has a Medicaid application in place to allow for LTC placement. If he continues to be dyspneic would request [facility] to reevaluate for [place].     The patient/family wants highest priority given to comfort, as opposed to independence (function) or longevity.    Advance Directives:    Has Advance  Directives: [x]  Yes []  No    Discussed: []  Yes [x]  No           Current CPR Status:  NO CPR  -  ALLOW NATURAL DEATH     Disposition / Further attention / Follow-up:     Continue to follow as needed for symptom management    Discussed with: CM Tamika; RN    Please do not hesitate to contact the Palliative Care Service if you have questions about the above recommendations.     Filbert Schilder, MD    Palliative Care Service, [facility], [place], [facility]  [place], [place] [address]            Interval History   All interval notes reviewed.   Med list reviewed.     CC: Dyspneic and tachypneic    Patient has been dyspneic and tachypneic since yesterday  He remains alert but unable to verbalize any symptoms    Pain: none  Dyspnea: Worsening  N/V: resolved  Constipation/Diarrhea: loose bowel movements daily  Anxiety/Depression: none  Other Distress: none    Medications     Medications:       Current Facility-Administered Medications   Medication Dose Route Frequency    acetaminophen  325 mg Oral TID    haloperidol  0.5 mg Oral QHS    HYDROmorphone  0.2 mg Intravenous Q6H    sodium chloride (PF)  3 mL Intravenous Q8H       acetaminophen, haloperidol, HYDROmorphone,  loperamide, ondansetron    Allergies     No Known Allergies    Physical Exam     BP 120/67    Pulse (!) 117    Temp 98.1 F (36.7 C) (Axillary)    Resp 16    Ht 1.778 m (5\' 10" ) Comment: 5 ft 10 in   Wt 63.7 kg (140 lb 6.9 oz)    SpO2 98%    BMI 20.15 kg/m     Physical Exam   Constitutional: He appears distressed.   arousable but does not follow commands   Eyes: No scleral icterus.   Cardiovascular: Normal rate, regular rhythm and normal heart sounds.   No murmur heard.  Pulmonary/Chest: He is in respiratory distress. He has wheezes. He has rales.   Abdominal: Soft. He exhibits no distension. There is no abdominal tenderness.   Musculoskeletal:         General: Edema present.   Neurological: He is alert.   nods in response to questions only; no  intelligible speech   Vitals reviewed.    Labs / Radiology     Lab and diagnostics: reviewed by me.  Recent Labs   Lab 02/15/18  0345   WBC 13.6*   Hemoglobin 9.1*   Hematocrit 29.0*   PLT CT 316     Recent Labs   Lab 02/15/18  0345   Sodium 146   Potassium 3.9   Chloride 115*   CO2 21   BUN 79*   Creatinine 2.94*   EGFR 23*   Glucose 140*   Calcium 7.8*         No results found.    Eval / Mgmt / Counseling Time     Evaluation and mgmt spent on palliative care concepts, end of life symptom management and discharge planning issues.  L2

## 2018-02-21 NOTE — Progress Notes (Signed)
[facility]   Patient Name: [name]   Attending Physician: Alba Cory, DO   Today's date:   02/21/2018 LOS: 14 days   Expected Discharge Date Expected Discharge Date: (pending placement)    Quick  Assessment:                                                              ReAdmit Risk Score: 16    CM Comments: DCP 02/21/18: Pt adm s/p fall, subdural hematoma.  PTA lived with niece as pcg.  Plan is for pt to be placed in LTC facility with Hospice, but needs payer source Medicaid is pending, per [facility], they are waiting for response from DSS about patients Medicaid, CM supervisor has offered contract to [facility] until Medicaid is approved, [facility] has responded and stated they currently have no beds at this, DCP has reached out to other facilities with contract offer    Physical Discharge Disposition: Long Term Care Bed at [facility]                                                                          Physical Discharge Disposition: Long Term Care Bed at SNF       Provider Notifications:            Ritta Slot LPN /DCP   [id number]

## 2018-02-21 NOTE — Progress Notes (Signed)
Medicine Progress Note   [facility]   Patient Name: [name], [name] LOS: 14 days   Attending Physician: Alba Cory, DO PCP: Melvyn Neth, MD      Hospital Course:                                                            Hendricks Milo Junior Grassia is a 78 y.o. male Ms. past medical history old stroke, CAD,diabetes mellitus, hypertension,stage 4 CKD and dementia who came in for extended for and skin laceration.  Patient was recently dced on plavix and eliquis . Eliquis was added on last admit bc of PE and DVT. Patient has dementia patient cannot provide any medical information history obtained from the ER physician and the chart.  Patient was found to have possible tiny subdural hematoma.  Seen by neurosurgical service, recommend observation overnight and repeat a CT scan of the head if there is no hematoma patient can be discharged.  initially held  Plavix/eliquis.  The scalp laceration been sutured. Repeat CT showed no evidence of bleed. Meds resumed. Placement pending as family states they cannot take care of him at home.    Has persistent leukocytosis likely from aspiration PNA.  Cr range seems WNL as per his baseline ckd 4.    Consulted palliative care because of the re-escalating issues and poor prognosis.  Family seems to be agreeable transitioning to comfort or hospice type care. After speaking with Dr. Dorthy Cooler it turns out family doesn't want IV antibiotics continued, wants only those measures that provide comfort, discontinuing antibiotics. Currently awaiting placement for hospice.      Assessment and Plan:      Right sided Aspiration PNA on CT--POA  --> had MBSS-- has op mod dysphagia --diet modified  --  MRSA nares negative  -- Influenza A n B -- neg     Patient's aspiration pneumonia initially seem to be getting better however patient took a turn into the weekend.  We cannot as CT repeated shows worsening.  Patient just started having fevers and  white cell count kept going up  Vancomycin added to Zosyn  2/4 transitioning to comfort care.. After conversations with palliative care family doesn't want antibiotics as intervention, only those for comfort, discontinued.      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  This is detected on the CT of the neck   CT of chest without contrast    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    Stage 4 CKD  Creatinine uptrending despite fluids  Renal ultrasound showed medical renal disease    Diarrhea -- possibly abx associated --  c diff study negative  Give imodium as needed    Hypertension -- continue with current care   No need for aggressive management because of comfort care    Diabetesmellitus    Single-vessel CAD --stable --continue plavix and  statin     Acute Bilateral PE diagnosed Dec 2019    Capacity,   -patient grunting in response to questions, otherwise patient AO x 0, spoke to RN this is normal for patient, no change.   DVT PPx: held 2/2 CMO    Dispo:     Pending arrangements for comfort care    Code:  DNR ( made with poa 2/1)     Subjective   No events, eats all of his meals.       Objective   Physical Exam:     Vitals: T:98.1 F (36.7 C) (Axillary), BP:120/67, HR:(!) 117, RR:16, SaO2:98%    General: Patient is hypoactive . No grunts, looked at me.   HEENT: No conjunctival drainage, vision is intact, anicteric sclera.  Neck: Supple, no thyromegaly.  Chest: lungs clearing, no  rhonchi today, no wheezing. No use of accessory muscles.  CVS: Normal rate and regular rhythm no murmurs, without JVD, no pitting edema, pulses palpable.  Abdomen: Soft, non-tender, no guarding or rigidity, with normal bowel sounds.  Extremities: No calf swelling and no gross deformity.  Skin: Warm, dry, no rash and no worrisome lesions.  NEURO: No motor or sensory deficits.  Psychiatric: Alert, looks at me, grunts, but otherwise not interactive   Weight Monitoring 11/28/2017 12/29/2017 12/29/2017 12/31/2017 01/01/2018 01/17/2018 02/04/2018   Height 167.6 cm - 167.6 cm - - 177.8 cm 177.8 cm   Weight 66.679 kg 64 kg 64 kg 63.2 kg 62.1 kg 64.91 kg 63.7 kg   Weight Method - Actual - Standing Scale Standing Scale - Bed Scale   BMI (calculated) 23.8 kg/m2 - 22.8 kg/m2 - - 20.6 kg/m2 20.2 kg/m2         Intake/Output Summary (Last 24 hours) at 02/21/2018 1884  Last data filed at 02/20/2018 1900  Gross per 24 hour   Intake 300 ml   Output    Net 300 ml     Body mass index is 20.15 kg/m.     Meds:     Current Facility-Administered Medications   Medication Dose Route Frequency    acetaminophen  325 mg Oral TID    amLODIPine  10 mg Oral QAM    finasteride  5 mg Oral QAM    haloperidol  0.5 mg Oral QHS    sodium chloride (PF)  3 mL Intravenous Q8H       PRN Meds: acetaminophen, baclofen, haloperidol, HYDROmorphone, loperamide, naloxone, ondansetron, promethazine.     LABS:     Estimated Creatinine Clearance: 19 mL/min (A) (based on SCr of 2.94 mg/dL (H)).  Recent Labs   Lab 02/15/18  0345   WBC 13.6*   RBC 3.02*   Hemoglobin 9.1*   Hematocrit 29.0*   MCV 96   PLT CT 316             Lab Results   Component Value Date    HGBA1CPERCNT 5.7 01/26/2018     Recent Labs   Lab 02/15/18  0345   Glucose 140*   Sodium 146   Potassium 3.9   Chloride 115*   CO2 21   BUN 79*   Creatinine 2.94*   EGFR 23*   Calcium 7.8*  Invalid input(s):  AMORPHOUSUA   Patient Lines/Drains/Airways Status    Active PICC Line / CVC Line / PIV  Line / Drain / Airway / Intraosseous Line / Epidural Line / ART Line / Line / Wound / Pressure Ulcer / NG/OG Tube     Name:   Placement date:   Placement time:   Site:   Days:    Peripheral IV 02/04/18 Anterior;Left Upper Arm   02/04/18    1610    Upper Arm   less than 1               No results found.        Alba Cory, DO     02/21/18,9:07 AM   MRN: [id number]                                      CSN: [id number] DOB: 28-Sep-1940

## 2018-02-22 MED ORDER — GUAIFENESIN-DM 100-10 MG/5ML PO SYRP
10.00 mL | ORAL_SOLUTION | Freq: Four times a day (QID) | ORAL | Status: DC | PRN
Start: 2018-02-22 — End: 2018-02-22
  Filled 2018-02-22: qty 10

## 2018-02-22 MED ORDER — GLYCOPYRROLATE 0.4 MG/2ML IJ SOLN
0.20 mg | INTRAMUSCULAR | Status: DC | PRN
Start: 2018-02-22 — End: 2018-02-24
  Filled 2018-02-22: qty 2

## 2018-02-22 MED ORDER — HYDROMORPHONE HCL 2 MG PO TABS
1.0000 mg | ORAL_TABLET | Freq: Two times a day (BID) | ORAL | Status: DC
Start: 2018-02-22 — End: 2018-02-25
  Administered 2018-02-22 – 2018-02-24 (×4): 1 mg via ORAL
  Filled 2018-02-22 (×4): qty 1

## 2018-02-22 NOTE — Progress Notes (Signed)
Medicine Progress Note   [facility]   Patient Name: [name], [name] LOS: 15 days   Attending Physician: Lily Kocher, * PCP: Melvyn Neth, MD      Hospital Course:                                                            Hendricks Milo Junior Graybeal is a 78 y.o. male Ms. past medical history old stroke, CAD,diabetes mellitus, hypertension,stage 4 CKD and dementia who came in for extended for and skin laceration.  Patient was recently dced on plavix and eliquis . Eliquis was added on last admit bc of PE and DVT. Patient has dementia patient cannot provide any medical information history obtained from the ER physician and the chart.  Patient was found to have possible tiny subdural hematoma.  Seen by neurosurgical service, recommend observation overnight and repeat a CT scan of the head if there is no hematoma patient can be discharged.  initially held  Plavix/eliquis.  The scalp laceration been sutured. Repeat CT showed no evidence of bleed. Meds resumed. Placement pending as family states they cannot take care of him at home.    Has persistent leukocytosis likely from aspiration PNA.  Cr range seems WNL as per his baseline ckd 4.    Consulted palliative care because of the re-escalating issues and poor prognosis.  Family seems to be agreeable transitioning to comfort or hospice type care. After speaking with Dr. Dorthy Cooler it turns out family doesn't want IV antibiotics continued, wants only those measures that provide comfort, discontinuing antibiotics. Currently awaiting placement for hospice.      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    Dispo:     Pending arrangements for comfort care    Code:  DNR and Allow natural death      Subjective   No events, eats all of his meals.       Objective   Physical Exam:     Vitals: T:99.1 F (37.3 C) (Oral), BP:140/83, HR:(!) 121, RR:16, SaO2:98%    General: Patient is hypoactive . No grunts, just look at   HEENT: No conjunctival  drainage, vision is intact, anicteric sclera.  Neck: Supple, no thyromegaly.  Chest: lungs clearing, no rhonchi today, no wheezing. No use of accessory muscles.  CVS: Normal rate and regular rhythm no murmurs, without JVD, no pitting edema, pulses palpable.  Abdomen: Soft, non-tender, no guarding or rigidity, with normal bowel sounds.  Extremities: No calf swelling and no gross deformity.  Skin: Warm, dry, no rash and no worrisome lesions.  NEURO: Grossly  no motor or sensory deficits.  Psychiatric: Alert, looks at me, grunts, but otherwise not interactive   Weight Monitoring 11/28/2017 12/29/2017 12/29/2017 12/31/2017 01/01/2018 01/17/2018 01/15/2018   Height 167.6 cm - 167.6 cm - - 177.8 cm 177.8 cm   Weight 66.679 kg 64 kg 64 kg 63.2 kg 62.1 kg 64.91 kg 63.7 kg   Weight Method - Actual - Standing Scale Standing Scale - Bed Scale   BMI (calculated) 23.8 kg/m2 - 22.8 kg/m2 - - 20.6 kg/m2 20.2 kg/m2         Intake/Output Summary (Last 24  hours) at 02/22/2018 1059  Last data filed at 02/22/2018 0800  Gross per 24 hour   Intake 360 ml   Output    Net 360 ml     Body mass index is 20.15 kg/m.     Meds:     Current Facility-Administered Medications   Medication Dose Route Frequency    acetaminophen  325 mg Oral TID    haloperidol  0.5 mg Oral QHS    HYDROmorphone  1 mg Oral BID    sodium chloride (PF)  3 mL Intravenous Q8H       PRN Meds: acetaminophen, glycopyrrolate, haloperidol, HYDROmorphone, loperamide.     LABS:     Estimated Creatinine Clearance: 19 mL/min (A) (based on SCr of 2.94 mg/dL (H)).        Invalid input(s): ADIFF, REFLX, CANCL, BAND, ABAND          Lab Results   Component Value Date    HGBA1CPERCNT 5.7 01/15/2018                   Invalid input(s):  AMORPHOUSUA   Patient Lines/Drains/Airways Status    Active PICC Line / CVC Line / PIV Line / Drain / Airway / Intraosseous Line / Epidural Line / ART Line / Line / Wound / Pressure Ulcer / NG/OG Tube     Name:   Placement date:   Placement time:   Site:   Days:    Peripheral IV 02/04/18 Anterior;Left Upper Arm   02/04/18    1610    Upper Arm   less than 1               No results found.        Lily Kocher, MD     02/22/18,10:59 AM   MRN: [id number]                                      CSN: [id number] DOB: November 13, 1940

## 2018-02-22 NOTE — Progress Notes (Signed)
[facility]  Follow-up Assessment  Date, Time: 02/22/18 9:43 AM  Patient Name: [name]  Referring Physician:  Lily Kocher, *   Primary Care Physician: Berdine Dance Clarita Crane, MD  Consulting Team: Odis Hollingshead, MD; Filbert Schilder, MD; Eulogio Ditch, PA; Adaline Sill, [place]  Consulting Service: Palliative Medicine  Reason for Follow-up: goals of care, advance care planning, clarification of prognosis and hospice information and/or referral  Palliative care is a specialized, interdisciplinary approach to improving comfort and quality of life at any stage of a serious illness by addressing symptoms, communication, and transitions.       Assessment & Recommendations   Impression / Assessment:  Dyspnea/tachypnea  Metabolic encephalopathy  Aspiration pneumonia  Advanced vascular dementia  Acute kidney injury on chronic kidney disease    Recommendations / Plan:  Pain:   Continue scheduled Tylenol liquid 325 mg 3 times daily  Has scheduled Dilaudid PO BID  Has Dilaudid 0.5 mg IV every 4 hours as needed pain    Non-pain Symptoms:     Dyspnea  Change Dilaudid to 1 mg PO Q BID scheduled  Has Dilaudid 0.5 mg IV q 4 hrs for severe pain or dyspnea  Place fan in room    Agitation  Continue Haldol 0.5 mg SL q HS  Keep Haldol 0.5 mg SL q 4 hrs for uncontrolled agitation    Other medication recommendations:   Added Robinul PRN for secretions      Nutrition: comfort feeding as tolerated with aspiration precautions  Consultants: Hospice and Pastoral care  Goals of care:     The patient is awaiting nursing home placement with hospice. Currently he has a Medicaid application in place to allow for LTC placement. If he develops unresolved symptoms (eg persistent dyspnea despite oral opiods), would request [facility] to reevaluate for [place].     The patient/family wants highest priority given to comfort, as opposed to independence (function) or longevity.    Advance Directives:     Has Advance Directives: [x]  Yes []  No    Discussed: []  Yes [x]  No           Current CPR Status:  NO CPR  -  ALLOW NATURAL DEATH     Disposition / Further attention / Follow-up:     Continue to follow as needed for symptom management    Discussed with:  RN Lilia Pro    Please do not hesitate to contact the Palliative Care Service if you have questions about the above recommendations.     Filbert Schilder, MD    Palliative Care Service, [facility] [place], [facility]  [place], [place] [address]            Interval History   All interval notes reviewed.   Med list reviewed.     CC: more comfortable    Much less dyspneic and tachypneic - received only one dose of IV Dilaudid this AM  remains alert but unable to verbalize any symptoms  Seems to be tolerating pureed diet well     Pain: none  Dyspnea: improved  N/V: resolved  Constipation/Diarrhea: loose bowel movements daily  Anxiety/Depression: none  Other Distress: none    Medications     Medications:       Current Facility-Administered Medications   Medication Dose Route Frequency    acetaminophen  325 mg Oral TID    haloperidol  0.5 mg Oral QHS    HYDROmorphone  1 mg Oral  BID    sodium chloride (PF)  3 mL Intravenous Q8H       acetaminophen, glycopyrrolate, haloperidol, HYDROmorphone, loperamide    Allergies     No Known Allergies    Physical Exam     BP 140/83    Pulse (!) 121    Temp 99.1 F (37.3 C) (Oral)    Resp 16    Ht 1.778 m (5\' 10" ) Comment: 5 ft 10 in   Wt 63.7 kg (140 lb 6.9 oz)    SpO2 98%    BMI 20.15 kg/m     Physical Exam   Constitutional: No distress.   arousable but does not follow commands   Eyes: No scleral icterus.   Cardiovascular: Normal rate, regular rhythm and normal heart sounds.   No murmur heard.  Pulmonary/Chest: No respiratory distress. He has wheezes. He has no rales.   Abdominal: Soft. He exhibits no distension. There is no abdominal tenderness.   Musculoskeletal:         General: Edema present.   Neurological: He is alert.    nods in response to questions only; no intelligible speech   Vitals reviewed.    Labs / Radiology     Lab and diagnostics: reviewed by me.              No results found.    Eval / Mgmt / Counseling Time     Evaluation and mgmt spent on palliative care concepts, end of life symptom management and discharge planning issues.  L1

## 2018-02-22 NOTE — Plan of Care (Signed)
Problem: Moderate/High Fall Risk Score >5  Goal: Patient will remain free of falls  Outcome: Progressing     Problem: Compromised Tissue integrity  Goal: Damaged tissue is healing and protected  Outcome: Progressing  Goal: Nutritional status is improving  Outcome: Progressing     Problem: Neurological Deficit  Goal: Neurological status is stable or improving  Outcome: Progressing     Problem: Potential for Aspiration  Goal: Risk of aspiration will be minimized  Outcome: Progressing     Problem: Compromised Hemodynamic Status  Goal: Vital signs and fluid balance maintained/improved  Outcome: Progressing     Problem: Impaired Mobility  Goal: Mobility/Activity is maintained at optimal level for patient  Outcome: Progressing     Problem: Infection  Goal: Free from infection  Outcome: Progressing     Problem: Psychosocial and Spiritual Needs  Goal: Demonstrates ability to cope with hospitalization/illness  Outcome: Progressing

## 2018-02-23 NOTE — Plan of Care (Signed)
Problem: Moderate/High Fall Risk Score >5  Goal: Patient will remain free of falls  Outcome: Progressing     Problem: Compromised Tissue integrity  Goal: Damaged tissue is healing and protected  Outcome: Progressing  Goal: Nutritional status is improving  Outcome: Progressing     Problem: Neurological Deficit  Goal: Neurological status is stable or improving  Outcome: Progressing     Problem: Potential for Aspiration  Goal: Risk of aspiration will be minimized  Outcome: Progressing     Problem: Compromised Hemodynamic Status  Goal: Vital signs and fluid balance maintained/improved  Outcome: Progressing     Problem: Impaired Mobility  Goal: Mobility/Activity is maintained at optimal level for patient  Outcome: Progressing     Problem: Infection  Goal: Free from infection  Outcome: Progressing     Problem: Psychosocial and Spiritual Needs  Goal: Demonstrates ability to cope with hospitalization/illness  Outcome: Progressing     Problem: Pain interferes with ability to perform ADL  Goal: Pain at adequate level as identified by patient  Outcome: Progressing     Problem: Side Effects from Pain Analgesia  Goal: Patient will experience minimal side effects of analgesic therapy  Outcome: Progressing

## 2018-02-23 NOTE — Progress Notes (Signed)
Chaplain visited with patient's nephew.  He is frustrated with the perceived lack of care for the patient in [place] before he came to live with nephew.  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  He wonders whether the patient has accepted Christ, but knows he has always had a servant heart.  He appreciates chaplains stopping by to pray for the patient.    Chaplaincy will continue to follow.    Chaplain Deanna       02/23/18 1700   Visit Type   Visit Type Follow-up   Visit Source   Visit Source Chaplain Initiated   Present at Visit   Present at Visit Patient;Family Member(s)  (nephew)   Spiritual Care Provided To   Spiritual Care Provided To Family Member(s)   Reason for Request   Reason for Request Emotional Support;Spiritual Assessment   Spiritual Assessment   Spiritual Assessment Difficulty Coping with Illness/Hospitalization;Family Needs Spiritual/Emotional Support;Finding Strength in Faith   Feelings Discouraged;Frustrated  (frustrated at lack of care before he lived with nephew)   Spiritual Care Interventions   Spiritual Care Interventions Chaplaincy Education;Comfort, Encouragement, Affirmation;Emotional Support;Explored Feelings;Faith Issues;Reflective and Compassionate Listening;Spiritual Encouragement   Explored Feelings Anxiety/Fear;Love   Spiritual Care Outcomes   Spiritual Care Outcomes Family Expressed Appreciation of Visit   Length of Visit   Length of Visit 16-30 minutes   Follow-Up   Follow-up Follow-up routine

## 2018-02-23 NOTE — Progress Notes (Signed)
Nutrition Therapy  Nutrition Follow Up    Patient Information:     Name:[name]   Age: 78 y.o.   Sex: male     MRN: [id number]      Recommendation:     1. Diet as tolerated for comfort.     Nutrition Assessment:     Pt awaiting nursing home placement with hospice care. Pt continues on comfort measures.     Nutrition Risk Level: Low    Nutrition Diagnosis:     None at this time     Assessment Data:     Height: 1.778 m (5\' 10" )   Weight: 63.7 kg (140 lb 6.9 oz)   BMI: Body mass index is 20.15 kg/m.          Diet Order:  Orders Placed This Encounter   Procedures   . Diet Pureed Liquid Consistency: Honey (Moderately Thick); Additional Restrictions: Consistent Carbohydrate, Cardiac              Additional Comments:       Suanne Marker, RD  02/23/2018 3:28 PM

## 2018-02-23 NOTE — Progress Notes (Signed)
Medicine Progress Note   [facility]   Patient Name: WENZEL, BACKLUND LOS: 16 days   Attending Physician: Lily Kocher, * PCP: Melvyn Neth, MD      Hospital Course:                                                            Hendricks Milo Junior Penrose is a 78 y.o. male Ms. past medical history old stroke, CAD,diabetes mellitus, hypertension,stage 4 CKD and dementia who came in for extended for and skin laceration.  Patient was recently dced on plavix and eliquis . Eliquis was added on last admit bc of PE and DVT. Patient has dementia patient cannot provide any medical information history obtained from the ER physician and the chart.  Patient was found to have possible tiny subdural hematoma.  Seen by neurosurgical service, recommend observation overnight and repeat a CT scan of the head if there is no hematoma patient can be discharged.  initially held  Plavix/eliquis.  The scalp laceration been sutured. Repeat CT showed no evidence of bleed. Meds resumed. Placement pending as family states they cannot take care of him at home.    Has persistent leukocytosis likely from aspiration PNA.  Cr range seems WNL as per his baseline ckd 4.    Consulted palliative care because of the re-escalating issues and poor prognosis.  Family seems to be agreeable transitioning to comfort or hospice type care. After speaking with Dr. Dorthy Cooler it turns out family doesn't want IV antibiotics continued, wants only those measures that provide comfort, discontinuing antibiotics. Currently awaiting placement for hospice.      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    Dispo:  Pending arrangements for comfort care    Code:  DNR and Allow natural death      Subjective   No events, eats all of his meals.       Objective   Physical Exam:     Vitals: T:97.7 F (36.5 C) (Axillary), BP:118/68, HR:(!) 117, RR:20, SaO2:100%    General: Patient is hypoactive . No grunts, just look at   HEENT: No conjunctival drainage, vision is  intact, anicteric sclera.  Neck: Supple, no thyromegaly.  Chest: lungs clearing, no rhonchi today, no wheezing. No use of accessory muscles.  CVS: Normal rate and regular rhythm no murmurs, without JVD, no pitting edema, pulses palpable.  Abdomen: Soft, non-tender, no guarding or rigidity, with normal bowel sounds.  Extremities: No calf swelling and no gross deformity.  Skin: Warm, dry, no rash and no worrisome lesions.  NEURO: Grossly  no motor or sensory deficits.  Psychiatric: Alert, looks at me, grunts, but otherwise not interactive   Weight Monitoring 11/28/2017 12/29/2017 12/29/2017 12/31/2017 01/01/2018 01/17/2018 01/15/2018   Height 167.6 cm - 167.6 cm - - 177.8 cm 177.8 cm   Weight 66.679 kg 64 kg 64 kg 63.2 kg 62.1 kg 64.91 kg 63.7 kg   Weight Method - Actual - Standing Scale Standing Scale - Bed Scale   BMI (calculated) 23.8 kg/m2 - 22.8 kg/m2 - - 20.6 kg/m2 20.2 kg/m2         Intake/Output Summary (Last  24 hours) at 02/23/2018 1433  Last data filed at 02/22/2018 1900  Gross per 24 hour   Intake 120 ml   Output    Net 120 ml     Body mass index is 20.15 kg/m.     Meds:     Current Facility-Administered Medications   Medication Dose Route Frequency    acetaminophen  325 mg Oral TID    haloperidol  0.5 mg Oral QHS    HYDROmorphone  1 mg Oral BID    sodium chloride (PF)  3 mL Intravenous Q8H       PRN Meds: acetaminophen, glycopyrrolate, haloperidol, HYDROmorphone, loperamide.     LABS:     Estimated Creatinine Clearance: 19 mL/min (A) (based on SCr of 2.94 mg/dL (H)).        Invalid input(s): ADIFF, REFLX, CANCL, BAND, ABAND          Lab Results   Component Value Date    HGBA1CPERCNT 5.7 02/08/2018                   Invalid input(s):  AMORPHOUSUA   Patient Lines/Drains/Airways Status    Active PICC Line / CVC Line / PIV Line / Drain / Airway / Intraosseous Line / Epidural Line / ART Line / Line / Wound / Pressure Ulcer / NG/OG Tube     Name:   Placement date:   Placement time:   Site:   Days:    Peripheral IV 02/04/18 Anterior;Left Upper Arm   02/04/18    1610    Upper Arm   less than 1               No results found.        Lily Kocher, MD     02/23/18,2:33 PM   MRN: [id number]                                      CSN: [id number] DOB: 03/29/1940

## 2018-02-23 NOTE — Plan of Care (Addendum)
NURSE NOTE SUMMARY  [facility]   Patient Name: [name]   Attending Physician: Lily Kocher, *   Today's date:   02/23/2018 LOS: 16 days   Shift Summary:                                                              No obvious distress noted, tolerating meds. Incontinent of bowel and bladder this shift.    Provider Notifications:      Rapid Response Notifications:  Mobility:      PMP Activity: Step 3 - Bed Mobility (02/22/2018  8:09 PM)     Weight tracking:  Family Dynamic:   No data found.          Recent Vitals Last Bowel Movement   BP: 140/83 (02/22/2018  7:32 AM)  Heart Rate: 98 (02/22/2018  8:00 AM)  Temp: 99.1 F (37.3 C) (02/22/2018  7:32 AM)  Resp Rate: 16 (02/22/2018  7:32 AM)  SpO2: 98 % (02/22/2018  7:32 AM)   Last BM Date: 02/22/18       Problem: Moderate/High Fall Risk Score >5  Goal: Patient will remain free of falls  Outcome: Progressing  Flowsheets (Taken 02/22/2018 2009)  VH High Risk (Greater than 13): ALL REQUIRED LOW INTERVENTIONS;ALL REQUIRED MODERATE INTERVENTIONS;RED "HIGH FALL RISK" SIGNAGE;A CHAIR PAD ALARM WILL BE USED WHEN PATIENT IS UP SITTING IN A CHAIR;BED ALARM WILL BE ACTIVATED WHEN THE PATEINT IS IN BED WITH SIGNAGE "RESET BED ALARM";PATIENT IS TO BE SUPERVISED FOR ALL TOILETING ACTIVITIES     Problem: Compromised Tissue integrity  Goal: Damaged tissue is healing and protected  Outcome: Progressing  Flowsheets (Taken 02/17/2018 0329)  Damaged tissue is healing and protected : Monitor/assess Braden scale every shift;Provide wound care per wound care algorithm;Reposition patient every 2 hours and as needed unless able to reposition self;Increase activity as tolerated/progressive mobility;Relieve pressure to bony prominences for patients at moderate and high risk;Avoid shearing injuries;Keep intact skin clean and dry;Use bath wipes, not soap and water, for daily bathing;Use incontinence wipes for cleaning urine, stool and caustic  drainage. Foley care as needed;Monitor external devices/tubes for correct placement to prevent pressure, friction and shearing;Monitor patient's hygiene practices;Encourage use of lotion/moisturizer on skin;Consult/collaborate with wound care nurse;Utilize specialty bed;Consider placing an indwelling catheter if incontinence interferes with healing of stage 3 or 4 pressure injury     Problem: Impaired Mobility  Goal: Mobility/Activity is maintained at optimal level for patient  Outcome: Progressing  Flowsheets (Taken 02/09/2018 0230)  Mobility/activity is maintained at optimal level for patient: Increase mobility as tolerated/progressive mobility;Perform active/passive ROM;Encourage independent activity per ability;Maintain proper body alignment;Plan activities to conserve energy, plan rest periods;Reposition patient every 2 hours and as needed unless able to reposition self;Assess for changes in respiratory status, level of consciousness and/or development of fatigue;Consult/collaborate with Physical Therapy and/or Occupational Therapy     Problem: Psychosocial and Spiritual Needs  Goal: Demonstrates ability to cope with hospitalization/illness  Outcome: Progressing  Flowsheets (Taken 02/17/2018 0329)  Demonstrates ability to cope with hospitalization/illness (Palliative Care): Provide quiet environment;Consult/collaborate with pastoral/spiritual care, social services, mental health counselor as needed;Assist patient to identify own strengths and abilities

## 2018-02-23 NOTE — UM Notes (Signed)
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    Dispo: Pending arrangements for comfort care      Vitals: T:97.7 F (36.5 C) (Axillary), BP:118/68, HR:(!) 117, RR:20, SaO2:100%    General: Patient is hypoactive . No grunts, just look at   HEENT: No conjunctival drainage, vision is intact, anicteric sclera.  Neck: Supple, no thyromegaly.  Chest: lungs clearing, no rhonchi today, no wheezing. No use of accessory muscles.  CVS: Normal rate and regular rhythm no murmurs, without JVD, no pitting edema, pulses palpable.  Abdomen: Soft, non-tender, no guarding or rigidity, with normal bowel sounds.  Extremities: No calf swelling and no gross deformity.  Skin: Warm, dry, no rash and no worrisome lesions.  NEURO: Grossly  no motor or sensory deficits.  Psychiatric: Alert, looks at me, grunts, but otherwise not  interactive        No new labs    CASE MANAGEMENT: per DCP note 2/11- "Plan is for pt to be placed in LTC facility with Hospice, but needs payer source Medicaid is pending, per [facility], they are waiting for response from DSS about patients Medicaid, CM supervisor has offered contract to [facility] until Medicaid is approved, [facility] has responded and stated they currently have no beds at this, DCP has reached out to other facilities with contract offer"      VITALS:  BP 118/68    Pulse (!) 117    Temp 97.7 F (36.5 C) (Axillary)    Resp 20    Ht 1.778 m (5\' 10" ) Comment: 5 ft 10 in   Wt 63.7 kg (140 lb 6.9 oz)    SpO2 100%    BMI 20.15 kg/m      Scheduled Meds:  Current Facility-Administered Medications   Medication Dose Route Frequency    acetaminophen  325 mg Oral TID    haloperidol  0.5 mg Oral QHS    HYDROmorphone  1 mg Oral BID    sodium chloride (PF)  3 mL Intravenous Q8H     Continuous Infusions:  PRN Meds:.acetaminophen, glycopyrrolate, haloperidol, HYDROmorphone, loperamide       Gabriela Eves, RN BSN  Utilization Review Nurse  Utilization Management  [facility]  [place]  [place], [place] [address]  Phone: [phone], direct/confidential  Fax: [phone]  [email or web address]

## 2018-02-24 MED ORDER — GLYCOPYRROLATE 0.4 MG/2ML IJ SOLN
0.20 mg | INTRAMUSCULAR | Status: DC | PRN
Start: 2018-02-24 — End: 2018-02-25
  Filled 2018-02-24: qty 2

## 2018-02-24 MED ORDER — CARBOXYMETHYLCELLULOSE SOD PF 0.5 % OP SOLN
2.00 [drp] | OPHTHALMIC | Status: DC | PRN
Start: 2018-02-24 — End: 2018-02-25
  Filled 2018-02-24: qty 1

## 2018-02-24 MED ORDER — GLYCOPYRROLATE 0.4 MG/2ML IJ SOLN
0.10 mg | INTRAMUSCULAR | Status: DC | PRN
Start: 2018-02-24 — End: 2018-02-24
  Filled 2018-02-24: qty 2

## 2018-02-24 NOTE — Plan of Care (Addendum)
NURSE NOTE SUMMARY  [facility] [place]   Patient Name: [name]   Attending Physician: Lily Kocher, *   Today's date:   02/24/2018 LOS: 17 days   Shift Summary:                                                              Pt less responsive this shift, tolerated approximately 35% of supper. No bowel movement this shift. Repositioned, hygenic needs met. Will continue to monitor.    Provider Notifications:      Rapid Response Notifications:  Mobility:      PMP Activity: Step 3 - Bed Mobility (02/23/2018  7:40 PM)     Weight tracking:  Family Dynamic:   No data found.          Recent Vitals Last Bowel Movement   BP: 118/68 (02/23/2018  8:09 AM)  Heart Rate: (!) 117 (02/23/2018  8:09 AM)  Temp: 97.7 F (36.5 C) (02/23/2018  8:09 AM)  Resp Rate: 20 (02/23/2018  8:09 AM)  SpO2: 100 % (02/23/2018  8:09 AM)   Last BM Date: 02/23/18       Problem: Moderate/High Fall Risk Score >5  Goal: Patient will remain free of falls  Outcome: Progressing  Flowsheets (Taken 02/23/2018 1940)  VH High Risk (Greater than 13): ALL REQUIRED LOW INTERVENTIONS;ALL REQUIRED MODERATE INTERVENTIONS;RED "HIGH FALL RISK" SIGNAGE;BED ALARM WILL BE ACTIVATED WHEN THE PATEINT IS IN BED WITH SIGNAGE "RESET BED ALARM";A CHAIR PAD ALARM WILL BE USED WHEN PATIENT IS UP SITTING IN A CHAIR;PATIENT IS TO BE SUPERVISED FOR ALL TOILETING ACTIVITIES

## 2018-02-24 NOTE — Plan of Care (Signed)
Problem: Moderate/High Fall Risk Score >5  Goal: Patient will remain free of falls  Outcome: Progressing     Problem: Compromised Tissue integrity  Goal: Damaged tissue is healing and protected  Flowsheets (Taken 02/24/2018 1203)  Damaged tissue is healing and protected : Monitor/assess Braden scale every shift; Reposition patient every 2 hours and as needed unless able to reposition self; Increase activity as tolerated/progressive mobility; Relieve pressure to bony prominences for patients at moderate and high risk; Avoid shearing injuries; Keep intact skin clean and dry; Use incontinence wipes for cleaning urine, stool and caustic drainage. Foley care as needed  Goal: Nutritional status is improving  Flowsheets (Taken 02/24/2018 1203)  Nutritional status is improving: Assist patient with eating; Allow adequate time for meals

## 2018-02-24 NOTE — UM Notes (Signed)
CONTINUED STAY REVIEW 02/24/2018  [employer]  Auth#  [id number]      [name]  [birth year]  MRN: [id number]      Medicine note:  Currently awaiting placement to LTC for hospice; pending Medicaid approval for such placement..      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     Dispo: Pending arrangements for comfort care;     Code:  DNR and Allow natural death          CASE MANAGEMENT: "Plan is for pt to be placed in LTC facility with Hospice, but needs payer source,  [facility] is working with family to complete Medicaid application, as DSS was requiring  more information. Patient continues to have no bed offers."    No new labs     VITALS:  BP 128/64    Pulse (!) 114    Temp 97.7 F (36.5 C) (Axillary)    Resp (!) 28    Ht 1.778 m (5\' 10" ) Comment: 5 ft 10 in   Wt 63.7 kg (140 lb 6.9 oz)    SpO2 95%    BMI 20.15 kg/m      Scheduled Meds:  Current Facility-Administered Medications   Medication Dose  Route Frequency    acetaminophen  325 mg Oral TID    HYDROmorphone  1 mg Oral BID    sodium chloride (PF)  3 mL Intravenous Q8H       Gabriela Eves, RN BSN  Utilization Review Nurse  Utilization Management  [facility]  [address]  [place], [place] [address]  Phone: [phone], direct/confidential  Fax: [phone]  [email or web address]

## 2018-02-24 NOTE — Progress Notes (Signed)
Medicine Progress Note   [facility]   Patient Name: [name], [name] LOS: 17 days   Attending Physician: Lily Kocher, * PCP: Melvyn Neth, MD      Hospital Course:                                                            Hendricks Milo Junior Hoffman is a 78 y.o. male Ms. past medical history old stroke, CAD,diabetes mellitus, hypertension,stage 4 CKD and dementia who came in for extended for and skin laceration.  Patient was recently dced on plavix and eliquis . Eliquis was added on last admit bc of PE and DVT. Patient has dementia patient cannot provide any medical information history obtained from the ER physician and the chart.  Patient was found to have possible tiny subdural hematoma.  Seen by neurosurgical service, recommend observation overnight and repeat a CT scan of the head if there is no hematoma patient can be discharged.  initially held  Plavix/eliquis.  The scalp laceration been sutured. Repeat CT showed no evidence of bleed. Meds resumed. Placement pending as family states they cannot take care of him at home.    Has persistent leukocytosis likely from aspiration PNA.  Cr range seems WNL as per his baseline ckd 4.    Consulted palliative care because of the re-escalating issues and poor prognosis.  Family seems to be agreeable transitioning to comfort or hospice type care. After speaking with Dr. Dorthy Cooler it turns out family doesn't want IV antibiotics continued, wants only those measures that provide comfort, discontinuing antibiotics. Currently awaiting placement to LTC for hospice; pending Medicaid approval for such placement..      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     Dispo:  Pending arrangements for comfort care;     Code:  DNR and Allow natural death      Subjective   No events, eats all of his meals.       Objective   Physical Exam:     Vitals: T:97.7 F (36.5 C) (Axillary), BP:128/64, HR:(!) 114, RR:(!) 28, SaO2:95%    General: Patient is  hypoactive . No grunts, just look at   HEENT: No conjunctival drainage, vision is intact, anicteric sclera.  Neck: Supple, no thyromegaly.  Chest: lungs clearing, no rhonchi today, no wheezing. No use of accessory muscles.  CVS: Normal rate and regular rhythm no murmurs, without JVD, no pitting edema, pulses palpable.  Abdomen: Soft, non-tender, no guarding or rigidity, with normal bowel sounds.  Extremities: No calf swelling and no gross deformity.  Skin: Warm, dry, no rash and no worrisome lesions.  NEURO: Grossly  no motor or sensory deficits.  Psychiatric: Alert, looks at me, grunts, but otherwise not interactive   Weight Monitoring 11/28/2017 12/29/2017 12/29/2017 12/31/2017 01/01/2018 01/17/2018 02/10/2018   Height 167.6 cm - 167.6 cm - - 177.8 cm 177.8 cm   Weight 66.679 kg 64 kg 64 kg 63.2 kg 62.1 kg 64.91 kg 63.7 kg   Weight Method - Actual - Standing Scale Standing Scale - Bed Scale   BMI (calculated)  23.8 kg/m2 - 22.8 kg/m2 - - 20.6 kg/m2 20.2 kg/m2         Intake/Output Summary (Last 24 hours) at 02/24/2018 1505  Last data filed at 02/23/2018 2000  Gross per 24 hour   Intake 120 ml   Output    Net 120 ml     Body mass index is 20.15 kg/m.     Meds:     Current Facility-Administered Medications   Medication Dose Route Frequency    acetaminophen  325 mg Oral TID    HYDROmorphone  1 mg Oral BID    sodium chloride (PF)  3 mL Intravenous Q8H       PRN Meds: acetaminophen, artificial tears ophthalmic solution, glycopyrrolate, haloperidol, HYDROmorphone, loperamide.     LABS:     Estimated Creatinine Clearance: 19 mL/min (A) (based on SCr of 2.94 mg/dL (H)).        Invalid input(s): ADIFF, REFLX, CANCL, BAND, ABAND          Lab Results   Component Value Date    HGBA1CPERCNT 5.7 02/08/2018                   Invalid input(s):  AMORPHOUSUA   Patient Lines/Drains/Airways Status    Active PICC Line / CVC Line / PIV Line / Drain / Airway / Intraosseous Line / Epidural Line / ART Line / Line / Wound / Pressure Ulcer / NG/OG Tube     Name:   Placement date:   Placement time:   Site:   Days:    Peripheral IV 02/04/18 Anterior;Left Upper Arm   02/04/18    1610    Upper Arm   less than 1               No results found.        Lily Kocher, MD     02/24/18,3:05 PM   MRN: [id number]                                      CSN: [id number] DOB: 1940/12/31

## 2018-02-24 NOTE — Progress Notes (Signed)
[place]  Follow-up Assessment  Date, Time: 02/24/18 1:57 PM  Patient Name: [name]  Referring Physician:  Lily Kocher, *   Primary Care Physician: Berdine Dance Clarita Crane, MD  Consulting Team: Odis Hollingshead, MD; Filbert Schilder, MD; Eulogio Ditch, PA; Adaline Sill, [place]  Consulting Service: Palliative Medicine  Reason for Follow-up: goals of care, advance care planning, clarification of prognosis and hospice information and/or referral  Palliative care is a specialized, interdisciplinary approach to improving comfort and quality of life at any stage of a serious illness by addressing symptoms, communication, and transitions.       Assessment & Recommendations   Impression / Assessment:  Dyspnea/tachypnea  Metabolic encephalopathy  Aspiration pneumonia  Advanced vascular dementia  Acute kidney injury on chronic kidney disease    Recommendations / Plan:  Pain:   Continue scheduled Tylenol liquid 325 mg 3 times daily  Has scheduled Dilaudid PO BID  Has Dilaudid 0.5 mg IV every 4 hours as needed pain    Non-pain Symptoms:     Dyspnea  continue Dilaudid to 1 mg PO Q BID scheduled  Has Dilaudid 0.5 mg IV q 4 hrs for severe pain or dyspnea    Agitation  Discontinue scheduled Haldol   Keep Haldol 0.5 mg SL q 4 hrs for uncontrolled agitation    Other medication recommendations:   Added Robinul PRN for secretions      Nutrition: comfort feeding as tolerated with aspiration precautions  Consultants: Hospice and Pastoral care  Goals of care:     The patient is awaiting nursing home placement with hospice- Medicaid application for LTC placement pending.   If he develops unresolved symptoms (eg persistent dyspnea despite oral opiods), would request [facility] to reevaluate for [place].     The patient/family wants highest priority given to comfort, as opposed to independence (function) or longevity.    Advance Directives:    Has Advance Directives: [x]  Yes []  No     Discussed: []  Yes [x]  No           Current CPR Status:  NO CPR  -  ALLOW NATURAL DEATH     Disposition / Further attention / Follow-up:     Continue to follow as needed for symptom management    Discussed with:  RN     Please do not hesitate to contact the Palliative Care Service if you have questions about the above recommendations.     Filbert Schilder, MD    Palliative Care Service, [facility], [place], [facility]  [place], [place] [address]            Interval History   All interval notes reviewed.   Med list reviewed.     CC: sleeping and difficult to arouse    no dyspnea or tachypnea noted  No agitation    Pain: none  Dyspnea: improved  N/V: resolved  Constipation/Diarrhea: soft bowel movements daily  Anxiety/Depression: none  Other Distress: none    Medications     Medications:       Current Facility-Administered Medications   Medication Dose Route Frequency    acetaminophen  325 mg Oral TID    HYDROmorphone  1 mg Oral BID    sodium chloride (PF)  3 mL Intravenous Q8H       acetaminophen, artificial tears ophthalmic solution, glycopyrrolate, haloperidol, HYDROmorphone, loperamide    Allergies     No Known Allergies    Physical Exam  BP 128/64    Pulse (!) 114    Temp 97.7 F (36.5 C) (Axillary)    Resp (!) 28    Ht 1.778 m (5\' 10" ) Comment: 5 ft 10 in   Wt 63.7 kg (140 lb 6.9 oz)    SpO2 95%    BMI 20.15 kg/m     Physical Exam   Constitutional: No distress.   Not arousable   Eyes: No scleral icterus.   Cardiovascular: Normal rate, regular rhythm and normal heart sounds.   No murmur heard.  Pulmonary/Chest: Effort normal. No respiratory distress. He has no wheezes. He has no rales.   Abdominal: Soft. He exhibits no distension. There is no abdominal tenderness.   Musculoskeletal:         General: Edema present.   Neurological:   Difficult to arouse with name calling   Vitals reviewed.    Labs / Radiology     Lab and diagnostics: reviewed by me.              No results found.    Eval / Mgmt /  Counseling Time     Evaluation and mgmt spent on palliative care concepts, end of life symptom management and discharge planning issues.  L1

## 2018-02-24 NOTE — Progress Notes (Signed)
[facility]   Patient Name: Wardell Heath   Attending Physician: Lily Kocher, *   Today's date:   02/24/2018 LOS: 17 days   Expected Discharge Date Expected Discharge Date: (pending placement)    Quick  Assessment:                                                              ReAdmit Risk Score: 16    CM Comments: DCP 02/24/18: Pt adm S/P  fall, subdural hematoma.  PTA lived with niece as pcg.  Plan is for pt to be placed in LTC facility with Hospice, but needs payer source,  [facility] is working with family to complete Medicaid application, as DSS was requiring  more information, [facility] as offreded contract to several facilities until [facility] is approved, patient continues to have no bed offers, DCP spoke to niece today and updated her on progress    Physical Discharge Disposition: Long Term Care Bed at [facility]                                                                          Physical Discharge Disposition: Long Term Care Bed at SNF       Provider Notifications:          Ritta Slot LPN /DCP   [id number]

## 2018-03-12 NOTE — Provider Clarification Note (Signed)
[facility] PROVIDER CLARIFICATION                                                                       Date of Request:  03/09/2018   Patient Name: [name], [name]  Account #: [id number]   Admit Date: 01/23/2018      Dear Dr. Shanon Ace    The medical record reflects the following: Metabolic encephalopathy only documented in consult notes starting on inpatient day 7 (2/3). Pt with PMH of dementia and nonverbal with no significant neuro changes since admission.      Question to Physician: Please clarify if the encephalopathy was:  A. Ruled in and POA  B. Ruled in and not POA  C. Ruled out as more information became available      PHYSICIAN RESPONSE:      C. Ruled out as more information became available,         Roslyn Smiling, RN  [facility] Specialist  [phone]  Date:  03/09/2018         This form is considered part of the permanent legal medical record.

## 2018-03-12 NOTE — Plan of Care (Addendum)
NURSE NOTE SUMMARY  [facility]   Patient Name: [name]   Attending Physician: Lily Kocher, *   Today's date:   03/06/18 LOS: 18 days   Shift Summary:                                                              Medication pt per PRN orders for RR >26. Pt not tolerating oral intake.  Will continue to monitor.    Provider Notifications:      Rapid Response Notifications:  Mobility:      PMP Activity: Step 1 - Bedrest (2018/03/06 12:00 AM)     Weight tracking:  Family Dynamic:   No data found.            Recent Vitals Last Bowel Movement   BP: 128/64 (02/24/2018 12:03 PM)  Heart Rate: (!) 114 (02/24/2018 12:03 PM)  Temp: 97.7 F (36.5 C) (02/24/2018 12:03 PM)  Resp Rate: (!) 32 (03/06/2018  3:25 AM)  SpO2: 95 % (02/24/2018 12:03 PM)     Last BM Date: 02/23/18         Problem: Pain interferes with ability to perform ADL  Goal: Pain at adequate level as identified by patient  Outcome: Progressing  Flowsheets (Taken 02/16/2018 0232 by Eugenie Filler, RN)  Pain at adequate level as identified by patient: Identify patient comfort function goal;Assess for risk of opioid induced respiratory depression, including snoring/sleep apnea. Alert healthcare team of risk factors identified.;Assess pain on admission, during daily assessment and/or before any "as needed" intervention(s);Reassess pain within 30-60 minutes of any procedure/intervention, per Pain Assessment, Intervention, Reassessment (AIR) Cycle;Evaluate if patient comfort function goal is met;Evaluate patient's satisfaction with pain management progress;Offer non-pharmacological pain management interventions;Consult/collaborate with Pain Service;Consult/collaborate with Physical Therapy, Occupational Therapy, and/or Speech Therapy;Include patient/patient care companion in decisions related to pain management as needed

## 2018-03-12 NOTE — Progress Notes (Signed)
Pt found at 1153 pulseless and unresponsive. MD notified and left message for niece to call back. Other family members entered room and notified of pt demise. Awaiting call back from niece.

## 2018-03-12 NOTE — Plan of Care (Signed)
Pt comfort care

## 2018-03-12 NOTE — Death Summary (Signed)
[facility]   Patient Name: [name], [name]   Date of Birth 1940/05/23,    78 y.o.   Attending Physician: Lily Kocher, *   Primary Care Physician: Melvyn Neth, MD   Date of Admission:  02/07/2018 LOS: 34 days   Pronouncement of Death   On March 08, 2018 at 3    Family has been notified at the time of this note.  Death was expected.    Code Status: NO CPR  -  [facility] Problem List:  Right sided Aspiration PNA on CT--POA    Moderate OP dysphagia     Demential liklely senile and vascular -- chronic     Small  acute subdural hematoma on the left, without mass effect due to accidental fall    Scalp laceration due to accidental fall S/P suture    Esophageal foreign body possible food or mass ??    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 Patient was recently discharged on plavix and eliquis . Eliquis was added on last admit because of PE and DVT.Patient was found to have possible tiny subdural hematoma.Seen by neurosurgical service, and trauma service with patient suffering from traumatic brain injury.  Patient was admitted and monitored and follow-up CT scan of the head showed resolution of the small subdural hematomaThe scalp laceration been sutured. Has persistent leukocytosis likely from aspiration PNA.    Consulted palliative care  because of the re-escalating issues and poor prognosis.  Family was agreeable transitioning to comfort or hospice type care. After speaking with Dr. Dorthy Cooler it turns out family doesn't want IV antibiotics continued, wants only those measures that provide comfort; , discontinuing antibiotics.  She was awaiting placement to LTC for hospice; pending Medicaid approval for such placement.. However she did continue deteriorate while being on comfort care measures only.  On 03-08-18, he was found pulseless and unresponsive not breathing and he was pronounced dead at 1153.  Family was contacted.      Lily Kocher, MD  2018-03-08 1:09 PM Department:  [facility]

## 2018-03-12 DEATH — deceased
